# Patient Record
Sex: Male | Born: 1965 | Race: White | Hispanic: No | Marital: Single | Smoking: Former smoker
Health system: Southern US, Community
[De-identification: ages and names within clinical notes are randomized; demographics above are authoritative.]

## PROBLEM LIST (undated history)

## (undated) DIAGNOSIS — F200 Paranoid schizophrenia: Secondary | ICD-10-CM

## (undated) DIAGNOSIS — F329 Major depressive disorder, single episode, unspecified: Secondary | ICD-10-CM

## (undated) DIAGNOSIS — I469 Cardiac arrest, cause unspecified: Secondary | ICD-10-CM

## (undated) DIAGNOSIS — I10 Essential (primary) hypertension: Secondary | ICD-10-CM

## (undated) DIAGNOSIS — F32A Depression, unspecified: Secondary | ICD-10-CM

## (undated) DIAGNOSIS — F411 Generalized anxiety disorder: Secondary | ICD-10-CM

## (undated) DIAGNOSIS — N179 Acute kidney failure, unspecified: Secondary | ICD-10-CM

---

## 2008-07-24 ENCOUNTER — Emergency Department (HOSPITAL_COMMUNITY): Admission: EM | Admit: 2008-07-24 | Discharge: 2008-07-24 | Payer: Self-pay | Admitting: Emergency Medicine

## 2015-08-10 ENCOUNTER — Encounter (HOSPITAL_COMMUNITY): Payer: Self-pay

## 2015-08-10 ENCOUNTER — Emergency Department (HOSPITAL_COMMUNITY): Payer: Self-pay

## 2015-08-10 ENCOUNTER — Encounter (HOSPITAL_COMMUNITY): Payer: Self-pay | Admitting: Emergency Medicine

## 2015-08-10 ENCOUNTER — Emergency Department (HOSPITAL_COMMUNITY)
Admission: EM | Admit: 2015-08-10 | Discharge: 2015-08-10 | Disposition: A | Discharge status: Home / Self Care | Payer: Self-pay | Attending: Emergency Medicine | Admitting: Emergency Medicine

## 2015-08-10 DIAGNOSIS — R569 Unspecified convulsions: Secondary | ICD-10-CM | POA: Insufficient documentation

## 2015-08-10 DIAGNOSIS — R251 Tremor, unspecified: Secondary | ICD-10-CM | POA: Insufficient documentation

## 2015-08-10 DIAGNOSIS — I10 Essential (primary) hypertension: Secondary | ICD-10-CM | POA: Insufficient documentation

## 2015-08-10 DIAGNOSIS — Z79899 Other long term (current) drug therapy: Secondary | ICD-10-CM | POA: Insufficient documentation

## 2015-08-10 DIAGNOSIS — R42 Dizziness and giddiness: Secondary | ICD-10-CM | POA: Insufficient documentation

## 2015-08-10 HISTORY — DX: Generalized anxiety disorder: F41.1

## 2015-08-10 HISTORY — DX: Depression, unspecified: F32.A

## 2015-08-10 HISTORY — DX: Major depressive disorder, single episode, unspecified: F32.9

## 2015-08-10 HISTORY — DX: Essential (primary) hypertension: I10

## 2015-08-10 LAB — URINALYSIS, ROUTINE W REFLEX MICROSCOPIC
Bilirubin Urine: NEGATIVE
Glucose, UA: NEGATIVE mg/dL
Hgb urine dipstick: NEGATIVE
Ketones, ur: 15 mg/dL — AB
LEUKOCYTES UA: NEGATIVE
NITRITE: NEGATIVE
PH: 6.5 (ref 5.0–8.0)
Protein, ur: 30 mg/dL — AB
SPECIFIC GRAVITY, URINE: 1.014 (ref 1.005–1.030)

## 2015-08-10 LAB — CBC WITH DIFFERENTIAL/PLATELET
BASOS PCT: 1 %
Basophils Absolute: 0.1 10*3/uL (ref 0.0–0.1)
EOS ABS: 0.1 10*3/uL (ref 0.0–0.7)
Eosinophils Relative: 1 %
HEMATOCRIT: 41 % (ref 39.0–52.0)
Hemoglobin: 14.4 g/dL (ref 13.0–17.0)
LYMPHS ABS: 1.3 10*3/uL (ref 0.7–4.0)
Lymphocytes Relative: 14 %
MCH: 33.6 pg (ref 26.0–34.0)
MCHC: 35.1 g/dL (ref 30.0–36.0)
MCV: 95.8 fL (ref 78.0–100.0)
MONO ABS: 0.4 10*3/uL (ref 0.1–1.0)
MONOS PCT: 4 %
NEUTROS ABS: 7.1 10*3/uL (ref 1.7–7.7)
Neutrophils Relative %: 80 %
Platelets: 160 10*3/uL (ref 150–400)
RBC: 4.28 MIL/uL (ref 4.22–5.81)
RDW: 13.4 % (ref 11.5–15.5)
WBC: 8.9 10*3/uL (ref 4.0–10.5)

## 2015-08-10 LAB — URINE MICROSCOPIC-ADD ON: RBC / HPF: NONE SEEN RBC/hpf (ref 0–5)

## 2015-08-10 LAB — BASIC METABOLIC PANEL
Anion gap: 10 (ref 5–15)
BUN: 10 mg/dL (ref 6–20)
CHLORIDE: 105 mmol/L (ref 101–111)
CO2: 22 mmol/L (ref 22–32)
Calcium: 9.6 mg/dL (ref 8.9–10.3)
Creatinine, Ser: 0.84 mg/dL (ref 0.61–1.24)
GFR calc non Af Amer: >60 mL/min (ref >60)
Glucose, Bld: 154 mg/dL — ABNORMAL HIGH (ref 65–99)
POTASSIUM: 3.7 mmol/L (ref 3.5–5.1)
Sodium: 137 mmol/L (ref 135–145)

## 2015-08-10 LAB — RAPID URINE DRUG SCREEN, HOSP PERFORMED
AMPHETAMINES: NOT DETECTED
BARBITURATES: NOT DETECTED
BENZODIAZEPINES: NOT DETECTED
COCAINE: NOT DETECTED
OPIATES: NOT DETECTED
TETRAHYDROCANNABINOL: NOT DETECTED

## 2015-08-10 LAB — ETHANOL: Alcohol, Ethyl (B): <5 mg/dL (ref <5)

## 2015-08-10 MED ORDER — LEVETIRACETAM 500 MG PO TABS
1500.0000 mg | ORAL_TABLET | Freq: Once | ORAL | Status: AC
  Administered 2015-08-10: 1500 mg via ORAL
  Filled 2015-08-10: qty 3

## 2015-08-10 MED ORDER — CHLORDIAZEPOXIDE HCL 25 MG PO CAPS: ORAL_CAPSULE | ORAL | Status: DC

## 2015-08-10 MED ORDER — LEVETIRACETAM 500 MG PO TABS: 500.0000 mg | ORAL_TABLET | Freq: Two times a day (BID) | ORAL | Status: DC

## 2015-08-10 MED ORDER — CHLORDIAZEPOXIDE HCL 25 MG PO CAPS
100.0000 mg | ORAL_CAPSULE | Freq: Once | ORAL | Status: AC
  Administered 2015-08-10: 100 mg via ORAL
  Filled 2015-08-10: qty 4

## 2015-08-10 NOTE — ED Notes (Signed)
Per [facility] patient had witness seizure lasting 2 minutes at approximately 0745, witnessed by mother who states she observed "full body convulsions".  Patient alert but confused.

## 2015-08-10 NOTE — ED Notes (Signed)
Pt stable, ambulatory, states understanding of discharge instructions 

## 2015-08-10 NOTE — ED Notes (Signed)
Pt arrived via [facility] c/o seizure at home, witnessed lasting , no injury, family states no previous hx of seizures, was full body shaking. Was discharged from ED earlier today

## 2015-08-10 NOTE — Discharge Instructions (Signed)
Seizure, Adult °A seizure means there is unusual activity in the brain. A seizure can cause changes in attention or behavior. Seizures often cause shaking (convulsions). Seizures often last from 30 seconds to 2 minutes. °HOME CARE  °· If you are given medicines, take them exactly as told by your doctor. °· Keep all doctor visits as told. °· Do not swim or drive until your doctor says it is okay. °· Teach others what to do if you have a seizure. They should: °¨ Lay you on the ground. °¨ Put a cushion under your head. °¨ Loosen any tight clothing around your neck. °¨ Turn you on your side. °¨ Stay with you until you get better. °GET HELP RIGHT AWAY IF:  °· The seizure lasts longer than 2 to 5 minutes. °· The seizure is very bad. °· The person does not wake up after the seizure. °· The person's attention or behavior changes. °Drive the person to the emergency room or call your local emergency services (911 in [place] °MAKE SURE YOU:  °· Understand these instructions. °· Will watch your condition. °· Will get help right away if you are not doing well or get worse. °  °This information is not intended to replace advice given to you by your health care provider. Make sure you discuss any questions you have with your health care provider. °  °Document Released: 07/17/2007 Document Revised: 04/22/2011 Document Reviewed: 09/09/2012 °Elsevier Interactive Patient Education ©2016 [employer] ° °

## 2015-08-10 NOTE — ED Provider Notes (Signed)
CSN: [id number]     Arrival date & time 08/10/15  1605 History   First MD Initiated Contact with Patient 08/10/15 1614     Chief Complaint  Patient presents with  . Seizures     (Consider location/radiation/quality/duration/timing/severity/associated sxs/prior Treatment) Patient is a 50 y.o. male presenting with seizures. The history is provided by the patient.  Seizures Seizure activity on arrival: no   Seizure type:  Grand mal Initial focality:  None Episode characteristics: abnormal movements and generalized shaking   Return to baseline: yes   Severity:  Mild Duration:  2 minutes Timing:  Once Number of seizures this episode:  2 Progression:  Resolved Context: alcohol withdrawal and change in medication (didnt take home klonipin)   Recent head injury:  No recent head injuries PTA treatment:  None History of seizures: no    50 yo M With a chief complaint of seizure. Per family he had full tonic-clonic activity while sitting on a stool. Did mention that he didn't feel well just prior to the event. Full-body shaking lasted about 2 minutes. Was followed by a post ictal. Patient is back to baseline on my exam does not remember what happened. This is the second such event today. The first when he bit his tongue. No loss of bowel or bladder. The patient has tried to cut back on his alcohol intake over the past couple days. He also has tried to cut back on his Klonopin.  Old records reviewed patient evaluated this morning with a unremarkable laboratory workup and negative UDS and a negative alcohol level.  Past Medical History  Diagnosis Date  . Hypertension   . Depression   . Generalized anxiety disorder    History reviewed. No pertinent past surgical history. History reviewed. No pertinent family history. Social History  Substance Use Topics  . Smoking status: Never Smoker   . Smokeless tobacco: None  . Alcohol Use: 50.4 oz/week    84 Cans of beer per week     Comment:  "about a 12 pack of beer everyday"    Review of Systems  Constitutional: Negative for fever and chills.  HENT: Negative for congestion and facial swelling.   Eyes: Negative for discharge and visual disturbance.  Respiratory: Negative for shortness of breath.   Cardiovascular: Negative for chest pain and palpitations.  Gastrointestinal: Negative for vomiting, abdominal pain and diarrhea.  Musculoskeletal: Negative for myalgias and arthralgias.  Skin: Negative for color change and rash.  Neurological: Positive for seizures. Negative for tremors, syncope and headaches.  Psychiatric/Behavioral: Negative for confusion and dysphoric mood.      Allergies  Review of patient's allergies indicates no known allergies.  Home Medications   Prior to Admission medications   Medication Sig Start Date End Date Taking? Authorizing Provider  amLODipine (NORVASC) 10 MG tablet Take 10 mg by mouth daily.    Historical Provider, MD  chlordiazePOXIDE (LIBRIUM) 25 MG capsule 50mg  PO TID x 1D, then 25-50mg  PO BID X 1D, then 25-50mg  PO QD X 1D 08/10/15   Melene Planan Zaxton Angerer, DO  clonazePAM (KLONOPIN) 1 MG tablet Take 0.5 mg by mouth 2 (two) times daily.    Historical Provider, MD  levETIRAcetam (KEPPRA) 500 MG tablet Take 1 tablet (500 mg total) by mouth 2 (two) times daily. 08/10/15   Melene Planan Vetra Shinall, DO  metoprolol (LOPRESSOR) 50 MG tablet Take 50 mg by mouth 2 (two) times daily.    Historical Provider, MD  Multiple Vitamins-Minerals (MULTIVITAL PO) Take 1 tablet by mouth  daily.    Historical Provider, MD  OLANZapine (ZYPREXA) 20 MG tablet Take 20 mg by mouth at bedtime.    Historical Provider, MD  venlafaxine XR (EFFEXOR-XR) 150 MG 24 hr capsule Take 150 mg by mouth daily with breakfast.    Historical Provider, MD   BP 118/95 mmHg  Pulse 73  Temp(Src) 99 F (37.2 C) (Oral)  Resp 16  SpO2 95% Physical Exam  Constitutional: He is oriented to person, place, and time. He appears well-developed and well-nourished.  HENT:   Head: Normocephalic and atraumatic.  Eyes: EOM are normal. Pupils are equal, round, and reactive to light.  Neck: Normal range of motion. Neck supple. No JVD present.  Cardiovascular: Normal rate and regular rhythm.  Exam reveals no gallop and no friction rub.   No murmur heard. Pulmonary/Chest: No respiratory distress. He has no wheezes.  Abdominal: He exhibits no distension. There is no tenderness. There is no rebound and no guarding.  Musculoskeletal: Normal range of motion.  Neurological: He is alert and oriented to person, place, and time. He has normal strength. No cranial nerve deficit or sensory deficit. He displays a negative Romberg sign. Coordination and gait normal. GCS eye subscore is 4. GCS verbal subscore is 5. GCS motor subscore is 6. He displays no Babinski's sign on the right side. He displays no Babinski's sign on the left side.  Reflex Scores:      Tricep reflexes are 2+ on the right side and 2+ on the left side.      Bicep reflexes are 2+ on the right side and 2+ on the left side.      Brachioradialis reflexes are 2+ on the right side and 2+ on the left side.      Patellar reflexes are 2+ on the right side and 2+ on the left side.      Achilles reflexes are 2+ on the right side and 2+ on the left side. Benign neuro exam  Skin: No rash noted. No pallor.  Psychiatric: He has a normal mood and affect. His behavior is normal.  Nursing note and vitals reviewed.   ED Course  Procedures (including critical care time) Labs Review Labs Reviewed  CBG MONITORING, ED    Imaging Review Ct Head Wo Contrast  08/10/2015  CLINICAL DATA:  Seizure.  Struck head during seizure EXAM: CT HEAD WITHOUT CONTRAST TECHNIQUE: Contiguous axial images were obtained from the base of the skull through the vertex without intravenous contrast. COMPARISON:  None. FINDINGS: No acute intracranial hemorrhage. No focal mass lesion. No CT evidence of acute infarction. No midline shift or mass effect. No  hydrocephalus. Basilar cisterns are patent. Paranasal sinuses and mastoid air cells are clear. Orbits are clear. IMPRESSION: Normal head CT. Electronically Signed   By: Genevive Bi M.D.   On: 08/10/2015 17:17   I have personally reviewed and evaluated these images and lab results as part of my medical decision-making.   EKG Interpretation   Date/Time:  Thursday August 10 2015 16:11:41 EDT Ventricular Rate:  82 PR Interval:    QRS Duration: 117 QT Interval:  402 QTC Calculation: 470 R Axis:   20 Text Interpretation:  Sinus rhythm Nonspecific intraventricular conduction  delay no wpw, prolonged qt, brugada No significant change since last  tracing Confirmed by Marquice Uddin MD, Reuel Boom [phone]) on 08/10/2015 4:56:30 PM      MDM   Final diagnoses:  Seizure (HCC)    50 yo M with a chief complaint of a  seizure. Patient had 2 such events today. Will obtain a CT scan of the head. Benign neuro exam. Will discuss with neurology for possible antiepileptic therapy.  Discussed with Dr. Cherylynn RidgesShikhman, will start on 500mg  of keppra bid.  Given oral load. Given script for librium taper.   10:09 PM:  I have discussed the diagnosis/risks/treatment options with the patient and family and believe the pt to be eligible for discharge home to follow-up with PCP. We also discussed returning to the ED immediately if new or worsening sx occur. We discussed the sx which are most concerning (e.g., recurrent seizure) that necessitate immediate return. Medications administered to the patient during their visit and any new prescriptions provided to the patient are listed below.  Medications given during this visit Medications  chlordiazePOXIDE (LIBRIUM) capsule 100 mg (100 mg Oral Given 08/10/15 1621)  levETIRAcetam (KEPPRA) tablet 1,500 mg (1,500 mg Oral Given 08/10/15 1938)    Discharge Medication List as of 08/10/2015  6:40 PM    START taking these medications   Details  chlordiazePOXIDE (LIBRIUM) 25 MG capsule 50mg   PO TID x 1D, then 25-50mg  PO BID X 1D, then 25-50mg  PO QD X 1D, Print    levETIRAcetam (KEPPRA) 500 MG tablet Take 1 tablet (500 mg total) by mouth 2 (two) times daily., Starting 08/10/2015, Until Discontinued, Print        The patient appears reasonably screen and/or stabilized for discharge and I doubt any other medical condition or other [facility] requiring further screening, evaluation, or treatment in the ED at this time prior to discharge.     Melene Planan Tina Gruner, DO 08/10/15 2209

## 2015-08-10 NOTE — ED Notes (Addendum)
Patient ambulated in hall independently with steady gait.  Complains of minor lightheadedness while walking.

## 2015-08-10 NOTE — ED Provider Notes (Signed)
CSN: [id number]     Arrival date & time 08/10/15  [id number] History   First MD Initiated Contact with Patient 08/10/15 [phone]     Chief Complaint  Patient presents with  . Seizures     (Consider location/radiation/quality/duration/timing/severity/associated sxs/prior Treatment) Patient is a 50 y.o. male presenting with seizures.  Seizures Seizure activity on arrival: no   Seizure type:  Grand mal Preceding symptoms comment:  Unable to specify Initial focality:  None Episode characteristics: abnormal movements   Postictal symptoms: confusion   Return to baseline: no   Severity:  Mild Duration:  2 minutes Timing:  Once Progression:  Resolved   Past Medical History  Diagnosis Date  . Hypertension   . Depression   . Generalized anxiety disorder    History reviewed. No pertinent past surgical history. No family history on file. Social History  Substance Use Topics  . Smoking status: Never Smoker   . Smokeless tobacco: None  . Alcohol Use: 50.4 oz/week    84 Cans of beer per week     Comment: "about a 12 pack of beer everyday"    Review of Systems  Neurological: Positive for seizures. Negative for dizziness and headaches.  All other systems reviewed and are negative.     Allergies  Review of patient's allergies indicates no known allergies.  Home Medications   Prior to Admission medications   Medication Sig Start Date End Date Taking? Authorizing Provider  amLODipine (NORVASC) 10 MG tablet Take 10 mg by mouth daily.   Yes Historical Provider, MD  chlordiazePOXIDE (LIBRIUM) 25 MG capsule 50mg  PO TID x 1D, then 25-50mg  PO BID X 1D, then 25-50mg  PO QD X 1D 08/10/15   Melene Planan Floyd, DO  clonazePAM (KLONOPIN) 1 MG tablet Take 0.5 mg by mouth 2 (two) times daily.   Yes Historical Provider, MD  levETIRAcetam (KEPPRA) 500 MG tablet Take 1 tablet (500 mg total) by mouth 2 (two) times daily. 08/10/15   Melene Planan Floyd, DO  metoprolol (LOPRESSOR) 50 MG tablet Take 50 mg by mouth 2 (two) times  daily.   Yes Historical Provider, MD  Multiple Vitamins-Minerals (MULTIVITAL PO) Take 1 tablet by mouth daily.   Yes Historical Provider, MD  OLANZapine (ZYPREXA) 20 MG tablet Take 20 mg by mouth at bedtime.   Yes Historical Provider, MD  venlafaxine XR (EFFEXOR-XR) 150 MG 24 hr capsule Take 150 mg by mouth daily with breakfast.   Yes Historical Provider, MD   BP 146/95 mmHg  Pulse 69  Temp(Src) 98.9 F (37.2 C) (Oral)  Resp 13  SpO2 96% Physical Exam  Constitutional: He is oriented to person, place, and time. He appears well-developed and well-nourished.  HENT:  Head: Normocephalic and atraumatic.  Neck: Normal range of motion.  Cardiovascular: Normal rate.   Pulmonary/Chest: Effort normal. No respiratory distress.  Abdominal: Soft. He exhibits no distension. There is no tenderness.  Musculoskeletal: Normal range of motion.  Neurological: He is alert and oriented to person, place, and time. No cranial nerve deficit. Coordination normal.  No altered mental status, able to give full seemingly accurate history.  Face is symmetric, EOM's intact, pupils equal and reactive, vision intact, tongue and uvula midline without deviation Upper and Lower extremity motor 5/5, intact pain perception in distal extremities, 2+ reflexes in biceps, patella and achilles tendons. Finger to nose normal, heel to shin normal.   Skin: Skin is warm and dry.  Nursing note and vitals reviewed.   ED Course  Procedures (including critical care  time) Labs Review Labs Reviewed  BASIC METABOLIC PANEL - Abnormal; Notable for the following:    Glucose, Bld 154 (*)    All other components within normal limits  URINALYSIS, ROUTINE W REFLEX MICROSCOPIC (NOT AT [facility]) - Abnormal; Notable for the following:    Ketones, ur 15 (*)    Protein, ur 30 (*)    All other components within normal limits  URINE MICROSCOPIC-ADD ON - Abnormal; Notable for the following:    Squamous Epithelial / LPF 0-5 (*)    Bacteria, UA FEW  (*)    All other components within normal limits  CBC WITH DIFFERENTIAL/PLATELET  URINE RAPID DRUG SCREEN, HOSP PERFORMED  ETHANOL    Imaging Review Ct Head Wo Contrast  08/10/2015  CLINICAL DATA:  Seizure.  Struck head during seizure EXAM: CT HEAD WITHOUT CONTRAST TECHNIQUE: Contiguous axial images were obtained from the base of the skull through the vertex without intravenous contrast. COMPARISON:  None. FINDINGS: No acute intracranial hemorrhage. No focal mass lesion. No CT evidence of acute infarction. No midline shift or mass effect. No hydrocephalus. Basilar cisterns are patent. Paranasal sinuses and mastoid air cells are clear. Orbits are clear. IMPRESSION: Normal head CT. Electronically Signed   By: Genevive BiStewart  Edmunds M.D.   On: 08/10/2015 17:17   I have personally reviewed and evaluated these images and lab results as part of my medical decision-making.   EKG Interpretation   Date/Time:  Thursday August 10 2015 08:51:51 EDT Ventricular Rate:  65 PR Interval:    QRS Duration: 115 QT Interval:  437 QTC Calculation: 455 R Axis:   41 Text Interpretation:  Sinus rhythm Nonspecific intraventricular conduction  delay Confirmed by Ayomide Zuleta MD, Barbara CowerJASON [phone]([id number]) on 08/10/2015 10:55:49 AM      MDM   Final diagnoses:  Seizure-like activity (HCC)   50 yo M here with seizure like activity. No obvious cause. No h/o same. Does have significant daily alcohol intake, but has not had withdrawal symptoms previously. Plan for basic workup. No trauma or neuro changes to indicate need for a head CT.    Workup ok. After observation, still close to baseline, doubt subclinical seizure activity currently. Plan for discharge with pcp follow up as needed. If repeat seizure will return here for repeat evaluation.   New Prescriptions: Discharge Medication List as of 08/10/2015 12:22 PM       I have personally and contemperaneously reviewed labs and imaging and used in my decision making as above.   A  medical screening exam was performed and I feel the patient has had an appropriate workup for their chief complaint at this time and likelihood of emergent condition existing is low and thus workup can continue on an outpatient basis.. Their vital signs are stable. They have been counseled on decision, discharge, follow up and which symptoms necessitate immediate return to the emergency department.  They verbally stated understanding and agreement with plan and discharged in stable condition.     Marily MemosJason Cincere Deprey, MD 08/11/15 [phone]

## 2018-04-01 ENCOUNTER — Other Ambulatory Visit: Payer: Self-pay

## 2018-04-01 ENCOUNTER — Inpatient Hospital Stay (HOSPITAL_COMMUNITY): Payer: Non-veteran care

## 2018-04-01 ENCOUNTER — Encounter (HOSPITAL_COMMUNITY): Payer: Self-pay | Admitting: *Deleted

## 2018-04-01 ENCOUNTER — Emergency Department (HOSPITAL_COMMUNITY): Payer: Non-veteran care

## 2018-04-01 ENCOUNTER — Inpatient Hospital Stay (HOSPITAL_COMMUNITY)
Admission: EM | Admit: 2018-04-01 | Discharge: 2018-04-08 | DRG: 917 | Disposition: A | Discharge status: Other Acute Inpatient Hospital | Payer: Non-veteran care | Attending: Internal Medicine | Admitting: Internal Medicine

## 2018-04-01 DIAGNOSIS — N170 Acute kidney failure with tubular necrosis: Secondary | ICD-10-CM | POA: Diagnosis not present

## 2018-04-01 DIAGNOSIS — R402212 Coma scale, best verbal response, none, at arrival to emergency department: Secondary | ICD-10-CM | POA: Diagnosis present

## 2018-04-01 DIAGNOSIS — F200 Paranoid schizophrenia: Secondary | ICD-10-CM | POA: Diagnosis not present

## 2018-04-01 DIAGNOSIS — Z915 Personal history of self-harm: Secondary | ICD-10-CM

## 2018-04-01 DIAGNOSIS — F332 Major depressive disorder, recurrent severe without psychotic features: Secondary | ICD-10-CM | POA: Diagnosis present

## 2018-04-01 DIAGNOSIS — R4182 Altered mental status, unspecified: Secondary | ICD-10-CM

## 2018-04-01 DIAGNOSIS — E876 Hypokalemia: Secondary | ICD-10-CM | POA: Diagnosis present

## 2018-04-01 DIAGNOSIS — F1011 Alcohol abuse, in remission: Secondary | ICD-10-CM | POA: Diagnosis present

## 2018-04-01 DIAGNOSIS — F17210 Nicotine dependence, cigarettes, uncomplicated: Secondary | ICD-10-CM | POA: Diagnosis present

## 2018-04-01 DIAGNOSIS — E440 Moderate protein-calorie malnutrition: Secondary | ICD-10-CM

## 2018-04-01 DIAGNOSIS — R402112 Coma scale, eyes open, never, at arrival to emergency department: Secondary | ICD-10-CM | POA: Diagnosis present

## 2018-04-01 DIAGNOSIS — J9601 Acute respiratory failure with hypoxia: Secondary | ICD-10-CM

## 2018-04-01 DIAGNOSIS — R579 Shock, unspecified: Secondary | ICD-10-CM

## 2018-04-01 DIAGNOSIS — I429 Cardiomyopathy, unspecified: Secondary | ICD-10-CM | POA: Diagnosis present

## 2018-04-01 DIAGNOSIS — R402312 Coma scale, best motor response, none, at arrival to emergency department: Secondary | ICD-10-CM | POA: Diagnosis present

## 2018-04-01 DIAGNOSIS — Z4659 Encounter for fitting and adjustment of other gastrointestinal appliance and device: Secondary | ICD-10-CM

## 2018-04-01 DIAGNOSIS — I5021 Acute systolic (congestive) heart failure: Secondary | ICD-10-CM | POA: Diagnosis present

## 2018-04-01 DIAGNOSIS — I340 Nonrheumatic mitral (valve) insufficiency: Secondary | ICD-10-CM

## 2018-04-01 DIAGNOSIS — E875 Hyperkalemia: Secondary | ICD-10-CM | POA: Diagnosis not present

## 2018-04-01 DIAGNOSIS — J690 Pneumonitis due to inhalation of food and vomit: Secondary | ICD-10-CM | POA: Diagnosis not present

## 2018-04-01 DIAGNOSIS — G9341 Metabolic encephalopathy: Secondary | ICD-10-CM | POA: Diagnosis present

## 2018-04-01 DIAGNOSIS — T447X2A Poisoning by beta-adrenoreceptor antagonists, intentional self-harm, initial encounter: Secondary | ICD-10-CM

## 2018-04-01 DIAGNOSIS — F4010 Social phobia, unspecified: Secondary | ICD-10-CM | POA: Diagnosis present

## 2018-04-01 DIAGNOSIS — I110 Hypertensive heart disease with heart failure: Secondary | ICD-10-CM | POA: Diagnosis present

## 2018-04-01 DIAGNOSIS — K7200 Acute and subacute hepatic failure without coma: Secondary | ICD-10-CM | POA: Diagnosis present

## 2018-04-01 DIAGNOSIS — N179 Acute kidney failure, unspecified: Secondary | ICD-10-CM

## 2018-04-01 DIAGNOSIS — T424X2A Poisoning by benzodiazepines, intentional self-harm, initial encounter: Principal | ICD-10-CM | POA: Diagnosis present

## 2018-04-01 DIAGNOSIS — F411 Generalized anxiety disorder: Secondary | ICD-10-CM | POA: Diagnosis present

## 2018-04-01 DIAGNOSIS — I371 Nonrheumatic pulmonary valve insufficiency: Secondary | ICD-10-CM

## 2018-04-01 DIAGNOSIS — Z66 Do not resuscitate: Secondary | ICD-10-CM | POA: Diagnosis present

## 2018-04-01 DIAGNOSIS — T50901A Poisoning by unspecified drugs, medicaments and biological substances, accidental (unintentional), initial encounter: Secondary | ICD-10-CM | POA: Diagnosis present

## 2018-04-01 DIAGNOSIS — I469 Cardiac arrest, cause unspecified: Secondary | ICD-10-CM | POA: Diagnosis present

## 2018-04-01 DIAGNOSIS — Z6820 Body mass index (BMI) 20.0-20.9, adult: Secondary | ICD-10-CM

## 2018-04-01 DIAGNOSIS — E872 Acidosis: Secondary | ICD-10-CM | POA: Diagnosis present

## 2018-04-01 DIAGNOSIS — F6381 Intermittent explosive disorder: Secondary | ICD-10-CM | POA: Diagnosis present

## 2018-04-01 DIAGNOSIS — R0689 Other abnormalities of breathing: Secondary | ICD-10-CM

## 2018-04-01 DIAGNOSIS — R34 Anuria and oliguria: Secondary | ICD-10-CM | POA: Diagnosis not present

## 2018-04-01 DIAGNOSIS — E162 Hypoglycemia, unspecified: Secondary | ICD-10-CM | POA: Diagnosis present

## 2018-04-01 DIAGNOSIS — G40909 Epilepsy, unspecified, not intractable, without status epilepticus: Secondary | ICD-10-CM | POA: Diagnosis present

## 2018-04-01 LAB — POCT I-STAT 7, (LYTES, BLD GAS, ICA,H+H)
Acid-Base Excess: 3 mmol/L — ABNORMAL HIGH (ref 0.0–2.0)
Bicarbonate: 26.6 mmol/L (ref 20.0–28.0)
Calcium, Ion: 1.18 mmol/L (ref 1.15–1.40)
HCT: 30 % — ABNORMAL LOW (ref 39.0–52.0)
Hemoglobin: 10.2 g/dL — ABNORMAL LOW (ref 13.0–17.0)
O2 Saturation: 100 %
Potassium: 3.4 mmol/L — ABNORMAL LOW (ref 3.5–5.1)
Sodium: 144 mmol/L (ref 135–145)
TCO2: 28 mmol/L (ref 22–32)
pCO2 arterial: 37.8 mmHg (ref 32.0–48.0)
pH, Arterial: 7.456 — ABNORMAL HIGH (ref 7.350–7.450)
pO2, Arterial: 390 mmHg — ABNORMAL HIGH (ref 83.0–108.0)

## 2018-04-01 LAB — ACETAMINOPHEN LEVEL: Acetaminophen (Tylenol), Serum: <10 ug/mL — ABNORMAL LOW (ref 10–30)

## 2018-04-01 LAB — HEPATIC FUNCTION PANEL
ALT: 12 U/L (ref 0–44)
AST: 15 U/L (ref 15–41)
Albumin: 3.1 g/dL — ABNORMAL LOW (ref 3.5–5.0)
Alkaline Phosphatase: 49 U/L (ref 38–126)
BILIRUBIN DIRECT: 0.2 mg/dL (ref 0.0–0.2)
Indirect Bilirubin: 0.5 mg/dL (ref 0.3–0.9)
Total Bilirubin: 0.7 mg/dL (ref 0.3–1.2)
Total Protein: 5.6 g/dL — ABNORMAL LOW (ref 6.5–8.1)

## 2018-04-01 LAB — RAPID URINE DRUG SCREEN, HOSP PERFORMED
Amphetamines: NOT DETECTED
Barbiturates: NOT DETECTED
Benzodiazepines: POSITIVE — AB
Cocaine: NOT DETECTED
Opiates: NOT DETECTED
Tetrahydrocannabinol: NOT DETECTED

## 2018-04-01 LAB — I-STAT TROPONIN, ED: Troponin i, poc: 0 ng/mL (ref 0.00–0.08)

## 2018-04-01 LAB — TROPONIN I
Troponin I: <0.03 ng/mL (ref <0.03)
Troponin I: <0.03 ng/mL (ref <0.03)

## 2018-04-01 LAB — CBC
HEMATOCRIT: 31.3 % — AB (ref 39.0–52.0)
Hemoglobin: 10.2 g/dL — ABNORMAL LOW (ref 13.0–17.0)
MCH: 32.6 pg (ref 26.0–34.0)
MCHC: 32.6 g/dL (ref 30.0–36.0)
MCV: 100 fL (ref 80.0–100.0)
Platelets: 131 10*3/uL — ABNORMAL LOW (ref 150–400)
RBC: 3.13 MIL/uL — ABNORMAL LOW (ref 4.22–5.81)
RDW: 13.3 % (ref 11.5–15.5)
WBC: 7.6 10*3/uL (ref 4.0–10.5)
nRBC: 0 % (ref 0.0–0.2)

## 2018-04-01 LAB — BASIC METABOLIC PANEL
Anion gap: 7 (ref 5–15)
BUN: 24 mg/dL — ABNORMAL HIGH (ref 6–20)
CO2: 25 mmol/L (ref 22–32)
Calcium: 8.3 mg/dL — ABNORMAL LOW (ref 8.9–10.3)
Chloride: 112 mmol/L — ABNORMAL HIGH (ref 98–111)
Creatinine, Ser: 2.12 mg/dL — ABNORMAL HIGH (ref 0.61–1.24)
GFR calc Af Amer: 41 mL/min — ABNORMAL LOW (ref >60)
GFR calc non Af Amer: 35 mL/min — ABNORMAL LOW (ref >60)
GLUCOSE: 83 mg/dL (ref 70–99)
Potassium: 3.3 mmol/L — ABNORMAL LOW (ref 3.5–5.1)
Sodium: 144 mmol/L (ref 135–145)

## 2018-04-01 LAB — SALICYLATE LEVEL: Salicylate Lvl: <7 mg/dL (ref 2.8–30.0)

## 2018-04-01 LAB — LACTATE DEHYDROGENASE: LDH: 218 U/L — ABNORMAL HIGH (ref 98–192)

## 2018-04-01 LAB — ETHANOL: Alcohol, Ethyl (B): <10 mg/dL (ref <10)

## 2018-04-01 LAB — CBG MONITORING, ED: Glucose-Capillary: 78 mg/dL (ref 70–99)

## 2018-04-01 LAB — ECHOCARDIOGRAM COMPLETE
Height: 74 in
Weight: 2539.7 oz

## 2018-04-01 LAB — GLUCOSE, CAPILLARY
Glucose-Capillary: 129 mg/dL — ABNORMAL HIGH (ref 70–99)
Glucose-Capillary: 70 mg/dL (ref 70–99)

## 2018-04-01 LAB — MRSA PCR SCREENING: MRSA by PCR: NEGATIVE

## 2018-04-01 LAB — MAGNESIUM: Magnesium: 2.1 mg/dL (ref 1.7–2.4)

## 2018-04-01 MED ORDER — SODIUM CHLORIDE 0.9% FLUSH
3.0000 mL | Freq: Once | INTRAVENOUS | Status: AC
  Administered 2018-04-01: 3 mL via INTRAVENOUS

## 2018-04-01 MED ORDER — SODIUM CHLORIDE 0.9 % IV SOLN
INTRAVENOUS | Status: AC | PRN
  Administered 2018-04-01: 1000 mL via INTRAVENOUS

## 2018-04-01 MED ORDER — SODIUM CHLORIDE 0.9 % IV SOLN: 250.0000 mL | INTRAVENOUS | Status: DC

## 2018-04-01 MED ORDER — FENTANYL CITRATE (PF) 100 MCG/2ML IJ SOLN: 50.0000 ug | Freq: Once | INTRAMUSCULAR | Status: DC

## 2018-04-01 MED ORDER — NOREPINEPHRINE 4 MG/250ML-% IV SOLN
2.0000 ug/min | INTRAVENOUS | Status: DC
  Administered 2018-04-01: 2 ug/min via INTRAVENOUS
  Filled 2018-04-01: qty 250

## 2018-04-01 MED ORDER — PROPOFOL 1000 MG/100ML IV EMUL: 0.0000 ug/kg/min | INTRAVENOUS | Status: DC

## 2018-04-01 MED ORDER — ROCURONIUM BROMIDE 50 MG/5ML IV SOLN
INTRAVENOUS | Status: AC | PRN
  Administered 2018-04-01: 70 mg via INTRAVENOUS

## 2018-04-01 MED ORDER — FENTANYL BOLUS VIA INFUSION
50.0000 ug | INTRAVENOUS | Status: DC | PRN
  Filled 2018-04-01: qty 50

## 2018-04-01 MED ORDER — POTASSIUM CHLORIDE 20 MEQ/15ML (10%) PO SOLN
40.0000 meq | Freq: Once | ORAL | Status: AC
  Administered 2018-04-01: 40 meq
  Filled 2018-04-01: qty 30

## 2018-04-01 MED ORDER — MIDAZOLAM HCL 2 MG/2ML IJ SOLN
2.0000 mg | INTRAMUSCULAR | Status: DC | PRN
  Administered 2018-04-02 – 2018-04-03 (×2): 2 mg via INTRAVENOUS
  Filled 2018-04-01 (×3): qty 2

## 2018-04-01 MED ORDER — HEPARIN SODIUM (PORCINE) 5000 UNIT/ML IJ SOLN
5000.0000 [IU] | Freq: Three times a day (TID) | INTRAMUSCULAR | Status: DC
  Administered 2018-04-01 – 2018-04-07 (×18): 5000 [IU] via SUBCUTANEOUS
  Filled 2018-04-01 (×21): qty 1

## 2018-04-01 MED ORDER — GLUCAGON HCL RDNA (DIAGNOSTIC) 1 MG IJ SOLR
10.0000 mg | Freq: Once | INTRAVENOUS | Status: AC
  Administered 2018-04-01: 10 mg via INTRAVENOUS
  Filled 2018-04-01: qty 10

## 2018-04-01 MED ORDER — FENTANYL 2500MCG IN NS 250ML (10MCG/ML) PREMIX INFUSION: 25.0000 ug/h | INTRAVENOUS | Status: DC

## 2018-04-01 MED ORDER — PANTOPRAZOLE SODIUM 40 MG IV SOLR: 40.0000 mg | Freq: Every day | INTRAVENOUS | Status: DC

## 2018-04-01 MED ORDER — NOREPINEPHRINE 4 MG/250ML-% IV SOLN
2.0000 ug/min | INTRAVENOUS | Status: DC
  Administered 2018-04-02: 10 ug/min via INTRAVENOUS
  Filled 2018-04-01: qty 250

## 2018-04-01 MED ORDER — MAGNESIUM SULFATE 2 GM/50ML IV SOLN
2.0000 g | Freq: Once | INTRAVENOUS | Status: AC
  Administered 2018-04-01: 2 g via INTRAVENOUS
  Filled 2018-04-01: qty 50

## 2018-04-01 MED ORDER — ORAL CARE MOUTH RINSE
15.0000 mL | OROMUCOSAL | Status: DC
  Administered 2018-04-01 – 2018-04-03 (×15): 15 mL via OROMUCOSAL

## 2018-04-01 MED ORDER — SODIUM CHLORIDE 0.9 % IV SOLN
2.0000 g | INTRAVENOUS | Status: DC
  Administered 2018-04-01 – 2018-04-05 (×5): 2 g via INTRAVENOUS
  Filled 2018-04-01 (×5): qty 20

## 2018-04-01 MED ORDER — LEVETIRACETAM IN NACL 1000 MG/100ML IV SOLN
1000.0000 mg | Freq: Once | INTRAVENOUS | Status: AC
  Administered 2018-04-01: 1000 mg via INTRAVENOUS
  Filled 2018-04-01: qty 100

## 2018-04-01 MED ORDER — SODIUM CHLORIDE 0.9 % IV BOLUS: 1000.0000 mL | Freq: Once | INTRAVENOUS | Status: AC

## 2018-04-01 MED ORDER — CHLORHEXIDINE GLUCONATE 0.12% ORAL RINSE (MEDLINE KIT)
15.0000 mL | Freq: Two times a day (BID) | OROMUCOSAL | Status: DC
  Administered 2018-04-01 – 2018-04-03 (×4): 15 mL via OROMUCOSAL

## 2018-04-01 MED ORDER — SODIUM CHLORIDE 0.9 % IV SOLN
INTRAVENOUS | Status: DC
  Administered 2018-04-01 (×2): via INTRAVENOUS

## 2018-04-01 MED ORDER — MIDAZOLAM HCL 2 MG/2ML IJ SOLN: 2.0000 mg | INTRAMUSCULAR | Status: DC | PRN

## 2018-04-01 MED ORDER — SODIUM BICARBONATE 8.4 % IV SOLN
50.0000 meq | Freq: Once | INTRAVENOUS | Status: AC
  Administered 2018-04-01: 50 meq via INTRAVENOUS
  Filled 2018-04-01: qty 50

## 2018-04-01 MED ORDER — EPINEPHRINE PF 1 MG/ML IJ SOLN
0.5000 ug/min | INTRAVENOUS | Status: DC
  Filled 2018-04-01: qty 4

## 2018-04-01 MED ORDER — ETOMIDATE 2 MG/ML IV SOLN
INTRAVENOUS | Status: AC | PRN
  Administered 2018-04-01: 20 mg via INTRAVENOUS

## 2018-04-01 MED ORDER — LEVETIRACETAM IN NACL 500 MG/100ML IV SOLN
500.0000 mg | Freq: Two times a day (BID) | INTRAVENOUS | Status: DC
  Administered 2018-04-01 – 2018-04-05 (×8): 500 mg via INTRAVENOUS
  Filled 2018-04-01 (×9): qty 100

## 2018-04-01 NOTE — Progress Notes (Addendum)
eLink Physician-Brief Progress Note Patient Name: David Dennis DOB: [birth year] MRN: [id number]   Date of Service  04/01/2018  HPI/Events of Note  Poison control advising to consider epinephrine, instead of current levo. EF 25%.  Labs, meds reviewed. Camera assessment done. Discussed with bed side RN. MAP 69. HR 68. Sinus. No PVC's. On Vent. sats fine. No active chf signs as per RN.   eICU Interventions  - DC levophed - low dose epi trial.  If dose increasing, need a central line. - UOP less. Cr /K/mag was low.  Get stat BMP/Mag level. Replace if low. - High intensity Insulin/glucogon or calcium chloride drip- not at this time. Watch for now.      Intervention Category Intermediate Interventions: Arrhythmia - evaluation and management;Communication with other healthcare providers and/or family;Hypotension - evaluation and management   22:26. Pharmacy assessed peripheral line.  Not a candidate for Epi via peripherally. Since HR, MAP ok, will stick to Levophed, titrate up to 5.   [name] 04/01/2018, 10:00 PM

## 2018-04-01 NOTE — ED Notes (Signed)
Poison Control contacted by this RN, they were informed of the three empty medication bottles found with the patient when he was discovered unresponsive at his home by his family. They are as follows: Metoprolol 50mg  tab x60 tabs, Clonazepam 0.5mg  x30 tabs, & Olanzapine 20mg  tab x30 tabs. The Poison Control Representative recommended to watch for bradycardia & hypotension d/t metoprolol being the most problematic. Also, it was recommended to draw a lactate, CMP, administer a bicarb bolus & insert a foley to monitor for decreased UO. MD Tegeler notified.

## 2018-04-01 NOTE — H&P (Signed)
NAME:  David Dennis, MRN:  [id number], DOB:  11-01-1965, LOS: 0 ADMISSION DATE:  04/01/2018, CONSULTATION DATE:  04/01/18 REFERRING MD:  Tegerler  CHIEF COMPLAINT:  Cardiac Arrest with probable overdose   Brief History   David Dennis is a 53 y.o. male with hx depression and anxiety who was admitted 2/19 after being found down, unresponsive, and pulseless.  He received 3 minutes of CPR prior to ROSC then intubated in ED.   Possible overdose of metoprolol, clonazepam, olanzapine; however, not confirmed as mother states that pt stopped taking his pills "cold [place]" in Nov 2019).  History of present illness   Pt is encephelopathic; therefore, this HPI is obtained from chart review. David Dennis is a 53 y.o. male who has a PMH of HTN, depression, anxiety.  He presented to [facility] ED 2/19 after being found down, unresponsive, and pulseless.  He had apparently been sleepy over the past few days and when family went to wake him up morning of 2/19, he was found unresponsive.  EMS was called and upon their arrival, pt was found pulseless with PEA rhythm.  He received 3 minutes of CPR before ROSC.  He never received any epinephrine or other ACLS medications or defibrillations.  After ROSC, a king airway was attempted to be placed but pt began to gag with questionable purposeful movement.  He was therefore placed on NRB and transported to ED.  Upon arrival to ED, pt did not have a good gag and there was concern for airway protection abilities giving the fact that he was gurgling; therefore, he was intubated.  He was found to have QTc of 478, K 3.4.  APAP and salicylate levels negative.  Additional labs and UDS still pending.  Per EMS, 3 empty bottles were found in pt's trashcan in his room (Metoprolol Tartrate 50mg  filled 08/21/17, Clonazepam 0.5mg  filled 04/28/16, Olanzapine 20mg  filled 12/03/17).  There was some concern for overdose; however, pt's mother states that pt quit taking all of his  medications cold [place] in Nov 2019 and as far as she is aware, was not taking any medications or drinking any alcohol now. She does state that he has a long hx of depression / anxiety and on multiple occasions, has said that he would rather be dead than alive.  She states he has never hinted at or attempted suicide in the past and she does not feel that he would have done so now. Of note, mother also states right before she called EMS, she noted that was breathing normally without any distress (although she could not wake him).  Past Medical History  HTN, depression, anxiety.  Significant Hospital Events   2/19 > admit.  Consults:  PCCM.  Procedures:  ETT 2/19 >   Significant Diagnostic Tests:  CT head 2/19 > negative. Echo 2/19 >   Micro Data:  Blood 2/19 >  Sputum 2/19 >  Urine 2/19 >   Antimicrobials:  None.   Interim history/subjective:  Unresponsive despite no sedation.  Objective:  Blood pressure (!) 135/104, pulse (!) 58, temperature (!) 96.8 F (36 C), temperature source Temporal, resp. rate 16, height 6\' 2"  (1.88 m), weight 72 kg, SpO2 100 %.    Vent Mode: PRVC FiO2 (%):  [100 %] 100 % Set Rate:  [16 bmp] 16 bmp Vt Set:  [650 mL] 650 mL PEEP:  [5 cmH20] 5 cmH20 Plateau Pressure:  [15 cmH20] 15 cmH20  No intake or output data  in the 24 hours ending 04/01/18 1042 Filed Weights   04/01/18 0923  Weight: 72 kg    Examination: General: Young adult male, in NAD. Neuro: Unresponsive despite no sedation.  Pupils unreactive. HEENT: [place]/AT. Sclerae anicteric.  ETT in place. Cardiovascular: RRR, no M/R/G.  Lungs: Respirations even and unlabored.  CTA bilaterally, No W/R/R.  Abdomen: BS x 4, soft, NT/ND.  Musculoskeletal: No gross deformities, no edema.  Skin: Intact, warm, no rashes.  Assessment & Plan:   Cardiac Arrest - despite mention of PEA, unsure if true arrest as pt was breathing appropriately just prior and only received CPR x 3 minutes without any  epinephrine or other ACLS medications or defibrillations.  If true arrest, unclear etiology at this point.  Labs still pending.  QTc slightly prolonged at 478.  Questionable / possible overdose on metoprolol, olanzapine, clonazepam. - Admit to ICU. - Defer TTM given questionable true arrest and goals of care discussions with mother (if event was a true arrest, pt wouldn't have wanted to be resuscitated in the first place). - Avoid fevers. - F/u on labs, UDS. - Empiric 2g Mag now while labs are pending. - Assess QTc q4hrs. - Avoid QT prolonging drugs. - Assess echo.  Respiratory insufficiency - due to inability to protect the airway in the setting of above. - Full vent support. - Assess ABG. - Wean as able. - Daily SBT if mental status allows. - Bronchial hygiene. - Follow CXR.  Hypokalemia. - 40 mEq K per tube. - Empiric 2g Mag as above.    Best Practice:  Diet: NPO. Pain/Anxiety/Delirium protocol (if indicated): Fentanyl PRN / Midazolam PRN. RASS goal 0 to -1. VAP protocol (if indicated): In place. DVT prophylaxis: SCD's / Heparin. GI prophylaxis: None due to prolonged QTc. Glucose control: None. Mobility: Bedrset. Code Status: DNR, confirmed with pt's mother. Family Communication: Mother updated at bedside in ED. Disposition: ICU.  Labs   CBC: Recent Labs  Lab 04/01/18 0929 04/01/18 1026  WBC 7.6  --   HGB 10.2* 10.2*  HCT 31.3* 30.0*  MCV 100.0  --   PLT 131*  --    Basic Metabolic Panel: Recent Labs  Lab 04/01/18 1026  NA 144  K 3.4*   GFR: CrCl cannot be calculated (Patient's most recent lab result is older than the maximum 21 days allowed.). Recent Labs  Lab 04/01/18 0929  WBC 7.6   Liver Function Tests: No results for input(s): AST, ALT, ALKPHOS, BILITOT, PROT, ALBUMIN in the last 168 hours. No results for input(s): LIPASE, AMYLASE in the last 168 hours. No results for input(s): AMMONIA in the last 168 hours. ABG    Component Value Date/Time     PHART 7.456 (H) 04/01/2018 1026   PCO2ART 37.8 04/01/2018 1026   PO2ART 390.0 (H) 04/01/2018 1026   HCO3 26.6 04/01/2018 1026   TCO2 28 04/01/2018 1026   O2SAT 100.0 04/01/2018 1026    Coagulation Profile: No results for input(s): INR, PROTIME in the last 168 hours. Cardiac Enzymes: No results for input(s): CKTOTAL, CKMB, CKMBINDEX, TROPONINI in the last 168 hours. HbA1C: No results found for: HGBA1C CBG: No results for input(s): GLUCAP in the last 168 hours.  Review of Systems:   Unable to obtain as pt is encephalopathic.  Past medical history  He,  has a past medical history of Depression, Generalized anxiety disorder, and Hypertension.   Surgical History   No past surgical history on file.   Social History   reports that  he has never smoked. He does not have any smokeless tobacco history on file. He reports current alcohol use of about 84.0 standard drinks of alcohol per week. He reports that he does not use drugs.   Family history   His family history is not on file.   Allergies No Known Allergies   Home meds  Prior to Admission medications   Medication Sig Start Date End Date Taking? Authorizing Provider  amLODipine (NORVASC) 10 MG tablet Take 10 mg by mouth daily.    [provider]  chlordiazePOXIDE (LIBRIUM) 25 MG capsule 50mg  PO TID x 1D, then 25-50mg  PO BID X 1D, then 25-50mg  PO QD X 1D 08/10/15   Melene Plan, DO  clonazePAM (KLONOPIN) 1 MG tablet Take 0.5 mg by mouth 2 (two) times daily.    [provider]  levETIRAcetam (KEPPRA) 500 MG tablet Take 1 tablet (500 mg total) by mouth 2 (two) times daily. 08/10/15   Melene Plan, DO  metoprolol (LOPRESSOR) 50 MG tablet Take 50 mg by mouth 2 (two) times daily.    [provider]  Multiple Vitamins-Minerals (MULTIVITAL PO) Take 1 tablet by mouth daily.    [provider]  OLANZapine (ZYPREXA) 20 MG tablet Take 20 mg by mouth at bedtime.    [provider]  venlafaxine XR  (EFFEXOR-XR) 150 MG 24 hr capsule Take 150 mg by mouth daily with breakfast.    [provider]    Critical care time: 40 min.    Rutherford Guys, PA [facility] Pager: [phone].  If no answer, [phone] 04/01/2018, 10:42 AM

## 2018-04-01 NOTE — ED Notes (Signed)
Report called to Chanetta Marshall RN on [facility]

## 2018-04-01 NOTE — ED Notes (Signed)
Pt just returned from CT.

## 2018-04-01 NOTE — ED Notes (Signed)
Pupils 3, sluggish.

## 2018-04-01 NOTE — ED Notes (Signed)
Notified provider of pt's low body temp & the attempt made to keep him warm with blankets. He denied needing to use any heating measures such a Lawyer.

## 2018-04-01 NOTE — ED Notes (Signed)
Pt Is getting an Echo at this time.

## 2018-04-01 NOTE — Progress Notes (Signed)
RT NOTE:  Pt was transported to 2H19 from ER on ventilator.

## 2018-04-01 NOTE — ED Triage Notes (Signed)
Pt here via [facility] from home after cardiac arrest.  Family stated that pt had been laying in bed and lethargic the last few days.  However, this am he was breathing, but unresponsive at 0805.  When fire arrived pt was pulseless and apneic with a rhythm of pea.  ROSC after 3 min cpr.  Pt was given 0.4 narcan and 1000 ns for bp 96/66.  cbg 116.  Upon arrival, rr 6, pupils 3, equal and unreactive.

## 2018-04-01 NOTE — ED Notes (Signed)
Respiratory called for transfer assistance to bring pt to 2H.

## 2018-04-01 NOTE — Progress Notes (Signed)
  Echocardiogram 2D Echocardiogram has been performed.  Pieter Partridge 04/01/2018, 4:56 PM

## 2018-04-01 NOTE — Progress Notes (Signed)
RT NOTE:  Transported pt to CT and back on ventilator. Pt is stable in his room.

## 2018-04-01 NOTE — ED Notes (Signed)
Pupils 3 and sluggish.

## 2018-04-01 NOTE — ED Notes (Signed)
Report was attempted to call to 2H, was informed someone will call back after a code finished.

## 2018-04-01 NOTE — ED Provider Notes (Addendum)
[facility] EMERGENCY DEPARTMENT Provider Note   CSN: [id number] Arrival date & time: 04/01/18  4098    History   Chief Complaint Chief Complaint  Patient presents with  . post arrest    HPI David Dennis is a 53 y.o. male.     The history is provided by the EMS personnel and medical records. No language interpreter was used.  Cardiac Arrest  Witnessed by:  Not witnessed Incident location:  Home Condition upon EMS arrival:  Agonal respirations Pulse:  Absent Initial cardiac rhythm per EMS:  PEA Airway:  Spontaneous respirations Rhythm on admission to ED:  Normal sinus Risk factors: drug overdose (possible?)   Risk factors: no trauma   Altered Mental Status  Severity:  Severe Most recent episode:  Today Episode history:  Single Timing:  Constant Progression:  Unchanged Chronicity:  New   LVL 5 caveat for cardiac arrest and unresponsive  Past Medical History:  Diagnosis Date  . Depression   . Generalized anxiety disorder   . Hypertension     There are no active problems to display for this patient.   No past surgical history on file.      Home Medications    Prior to Admission medications   Medication Sig Start Date End Date Taking? Authorizing Provider  amLODipine (NORVASC) 10 MG tablet Take 10 mg by mouth daily.    [provider]  chlordiazePOXIDE (LIBRIUM) 25 MG capsule 50mg  PO TID x 1D, then 25-50mg  PO BID X 1D, then 25-50mg  PO QD X 1D 08/10/15   Melene Plan, DO  clonazePAM (KLONOPIN) 1 MG tablet Take 0.5 mg by mouth 2 (two) times daily.    [provider]  levETIRAcetam (KEPPRA) 500 MG tablet Take 1 tablet (500 mg total) by mouth 2 (two) times daily. 08/10/15   Melene Plan, DO  metoprolol (LOPRESSOR) 50 MG tablet Take 50 mg by mouth 2 (two) times daily.    [provider]  Multiple Vitamins-Minerals (MULTIVITAL PO) Take 1 tablet by mouth daily.    [provider]  OLANZapine (ZYPREXA) 20  MG tablet Take 20 mg by mouth at bedtime.    [provider]  venlafaxine XR (EFFEXOR-XR) 150 MG 24 hr capsule Take 150 mg by mouth daily with breakfast.    [provider]    Family History No family history on file.  Social History Social History   Tobacco Use  . Smoking status: Never Smoker  Substance Use Topics  . Alcohol use: Yes    Alcohol/week: 84.0 standard drinks    Types: 84 Cans of beer per week    Comment: "about a 12 pack of beer everyday"  . Drug use: No     Allergies   Patient has no known allergies.   Review of Systems Review of Systems  Unable to perform ROS: Patient unresponsive     Physical Exam Updated Vital Signs BP 94/69 (BP Location: Right Arm)   Pulse 60   Temp (!) 96.8 F (36 C) (Temporal)   Resp (!) 6   SpO2 96%   Physical Exam Vitals signs and nursing note reviewed.  Constitutional:      Appearance: He is ill-appearing. He is not toxic-appearing or diaphoretic.  HENT:     Head: Normocephalic.     Nose: No congestion or rhinorrhea.     Mouth/Throat:     Mouth: Mucous membranes are moist.     Pharynx: No oropharyngeal exudate or posterior oropharyngeal  erythema.  Eyes:     Conjunctiva/sclera: Conjunctivae normal.     Pupils: Pupils are equal, round, and reactive to light.     Comments: 3mm reactive bilaterally   Neck:     Musculoskeletal: No muscular tenderness.  Cardiovascular:     Rate and Rhythm: Normal rate.     Pulses: Normal pulses.     Heart sounds: No murmur.  Pulmonary:     Effort: Respiratory distress (bradypnea) present.     Breath sounds: No stridor. Rhonchi present. No wheezing or rales.  Chest:     Chest wall: No tenderness.  Abdominal:     General: Abdomen is flat.     Tenderness: There is no abdominal tenderness.  Musculoskeletal:        General: No tenderness.  Skin:    Capillary Refill: Capillary refill takes less than 2 seconds.     Coloration: Skin is not pale.     Findings: No  rash.  Neurological:     Mental Status: He is unresponsive.     GCS: GCS eye subscore is 1. GCS verbal subscore is 1. GCS motor subscore is 1.     Comments: GCS of 3 on arrival.  Patient clearly unresponsive to pain.  Agonal respirations with unsecure airway.        ED Treatments / Results  Labs (all labs ordered are listed, but only abnormal results are displayed) Labs Reviewed  BASIC METABOLIC PANEL - Abnormal; Notable for the following components:      Result Value   Potassium 3.3 (*)    Chloride 112 (*)    BUN 24 (*)    Creatinine, Ser 2.12 (*)    Calcium 8.3 (*)    GFR calc non Af Amer 35 (*)    GFR calc Af Amer 41 (*)    All other components within normal limits  CBC - Abnormal; Notable for the following components:   RBC 3.13 (*)    Hemoglobin 10.2 (*)    HCT 31.3 (*)    Platelets 131 (*)    All other components within normal limits  RAPID URINE DRUG SCREEN, HOSP PERFORMED - Abnormal; Notable for the following components:   Benzodiazepines POSITIVE (*)    All other components within normal limits  HEPATIC FUNCTION PANEL - Abnormal; Notable for the following components:   Total Protein 5.6 (*)    Albumin 3.1 (*)    All other components within normal limits  ACETAMINOPHEN LEVEL - Abnormal; Notable for the following components:   Acetaminophen (Tylenol), Serum <10 (*)    All other components within normal limits  POCT I-STAT 7, (LYTES, BLD GAS, ICA,H+H) - Abnormal; Notable for the following components:   pH, Arterial 7.456 (*)    pO2, Arterial 390.0 (*)    Acid-Base Excess 3.0 (*)    Potassium 3.4 (*)    HCT 30.0 (*)    Hemoglobin 10.2 (*)    All other components within normal limits  CULTURE, BLOOD (ROUTINE X 2)  CULTURE, BLOOD (ROUTINE X 2)  URINE CULTURE  CULTURE, RESPIRATORY  SALICYLATE LEVEL  ETHANOL  MAGNESIUM  HIV ANTIBODY (ROUTINE TESTING W REFLEX)  TROPONIN I  TROPONIN I  CBC  BASIC METABOLIC PANEL  BLOOD GAS, ARTERIAL  MAGNESIUM  PHOSPHORUS   I-STAT TROPONIN, ED  CBG MONITORING, ED    EKG EKG Interpretation  Date/Time:  Wednesday April 01 2018 09:22:56 EST Ventricular Rate:  61 PR Interval:    QRS Duration: 134 QT Interval:  474 QTC Calculation: 478 R Axis:   58 Text Interpretation:  Sinus rhythm IVCD, consider atypical RBBB When comapred to prior, no signiciatn changes seen.  No STEMI Confirmed by Theda Belfast ([id number]) on 04/01/2018 9:34:45 AM Also confirmed by Theda Belfast ([id number]), editor Barbette Hair [phone])  on 04/01/2018 10:13:03 AM   Radiology Ct Head Wo Contrast  Result Date: 04/01/2018 CLINICAL DATA:  Post CPR EXAM: CT HEAD WITHOUT CONTRAST TECHNIQUE: Contiguous axial images were obtained from the base of the skull through the vertex without intravenous contrast. COMPARISON:  08/10/2015 FINDINGS: Brain: No evidence of acute infarction, hemorrhage, hydrocephalus, extra-axial collection or mass lesion/mass effect. Vascular: No hyperdense vessel or unexpected calcification. Skull: Normal. Negative for fracture or focal lesion. Sinuses/Orbits: No acute finding. Other: None. IMPRESSION: No acute intracranial pathology. Electronically Signed   By: Lauralyn Primes M.D.   On: 04/01/2018 10:51   Dg Chest Portable 1 View  Result Date: 04/01/2018 CLINICAL DATA:  Status post intubation. EXAM: PORTABLE CHEST 1 VIEW COMPARISON:  None. FINDINGS: The heart size and mediastinal contours are within normal limits. Both lungs are clear. Endotracheal tube is seen projected over tracheal air shadow with distal tip 5 cm above the carina. Distal tip of nasogastric tube is seen in proximal stomach. The visualized skeletal structures are unremarkable. IMPRESSION: Endotracheal tube in grossly good position. Distal tip of nasogastric tube in proximal stomach. No acute cardiopulmonary abnormality seen. Electronically Signed   By: Lupita Raider, M.D.   On: 04/01/2018 10:20    Procedures Procedure Name: Intubation Date/Time: 04/01/2018 5:25  PM Performed by: Heide Scales, MD Pre-anesthesia Checklist: Patient identified, Patient being monitored, Emergency Drugs available, Timeout performed and Suction available Oxygen Delivery Method: Ambu bag Preoxygenation: Pre-oxygenation with 100% oxygen Induction Type: Rapid sequence Ventilation: Mask ventilation without difficulty Grade View: Grade I Tube size: 7.5 mm Number of attempts: 1 Airway Equipment and Method: Video-laryngoscopy Placement Confirmation: ETT inserted through vocal cords under direct vision,  CO2 detector and Breath sounds checked- equal and bilateral Tube secured with: ETT holder Dental Injury: Teeth and Oropharynx as per pre-operative assessment       (including critical care time)  CRITICAL CARE Performed by: Canary Brim Nicanor Mendolia Total critical care time: 60 minutes Critical care time was exclusive of separately billable procedures and treating other patients. Critical care was necessary to treat or prevent imminent or life-threatening deterioration. Critical care was time spent personally by me on the following activities: development of treatment plan with patient and/or surrogate as well as nursing, discussions with consultants, evaluation of patient's response to treatment, examination of patient, obtaining history from patient or surrogate, ordering and performing treatments and interventions, ordering and review of laboratory studies, ordering and review of radiographic studies, pulse oximetry and re-evaluation of patient's condition.   Medications Ordered in ED Medications  heparin injection 5,000 Units (has no administration in time range)  0.9 %  sodium chloride infusion ( Intravenous New Bag/Given 04/01/18 1115)  levETIRAcetam (KEPPRA) IVPB 500 mg/100 mL premix (has no administration in time range)  fentaNYL (SUBLIMAZE) injection 50 mcg (has no administration in time range)  fentaNYL in NS (54mcg/ml) infusion-PREMIX (has no  administration in time range)  fentaNYL (SUBLIMAZE) bolus via infusion 50 mcg (has no administration in time range)  midazolam (VERSED) injection 2 mg (has no administration in time range)  midazolam (VERSED) injection 2 mg (has no administration in time range)  etomidate (AMIDATE) injection (20 mg Intravenous Given 04/01/18 0924)  rocuronium (ZEMURON)  injection (70 mg Intravenous Given 04/01/18 0925)  sodium chloride flush (NS) 0.9 % injection 3 mL (3 mLs Intravenous Given 04/01/18 1050)  0.9 %  sodium chloride infusion ( Intravenous Stopped 04/01/18 1050)  sodium chloride 0.9 % bolus 1,000 mL ( Intravenous Canceled Entry 04/01/18 1003)  sodium bicarbonate injection 50 mEq (50 mEq Intravenous Given 04/01/18 1056)  levETIRAcetam (KEPPRA) IVPB 1000 mg/100 mL premix (0 mg Intravenous Stopped 04/01/18 1152)  potassium chloride 20 MEQ/15ML (10%) solution 40 mEq (40 mEq Per Tube Given 04/01/18 1256)  magnesium sulfate IVPB 2 g 50 mL (0 g Intravenous Stopped 04/01/18 1350)     Initial Impression / Assessment and Plan / ED Course  I have reviewed the triage vital signs and the nursing notes.  Pertinent labs & imaging results that were available during my care of the patient were reviewed by me and considered in my medical decision making (see chart for details).        David Dennis is a 53 y.o. male with a past medical history significant for hypertension, depression, and anxiety who presents as a post arrest.  Patient brought in by EMS.  According to EMS report from family, patient has been acting sleepy the last few days and they are unsure what time he went to bed.  They report this morning they went to wake him up and he was unresponsive.  According to EMS report from fire, patient was found pulseless and apneic.  Patient had 3 minutes of CPR with no medications or shockable rhythm.  Patient then had return of spontaneous circulation.  When EMS arrived, patient was breathing on his own and had  a gag reflex.  Patient was transported on the nonrebreather.  Therefore they attempted to use a King airway but patient started gagging and they decided to transport with nonrebreather in place.  On arrival, patient is in sinus rhythm.  Blood pressure is around 100 systolic.  Patient is GCS is 3 and he is completely unresponsive to any voice, or painful stimulation.  Patient no longer has a gag.  Patient is having some gurgling in his respirations, do not feel he is protecting his airway.  Decision made to intubate.  RSI was utilized and patient was given etomidate and rocuronium.  Patient was intubated without difficulty and post intubation x-ray confirmed tube placement.  Patient had improvement in his blood pressure with fluids.  EMS reports that patient had 3 empty bottles of medications in the room although it is unclear if this was a suicide attempt.  Patient had an empty bottle of olanzapine, 50 mg tablets of metoprolol, and 0.5 mg tablets of Klonopin.  Family has not been available to provide further information at this time.  Please control be called.  EKG did not show STEMI or significant arrhythmia at this time.  He was not bradycardic.  QRS was slightly widened.  Per poison control, patient will be given sodium bicarb.  This was ordered.  Critical care called after intubation for further management.  Will obtain CT imaging labs and further work-up.  Patient will be admitted for further management of cardiac arrest and possible overdose.  CT head showed no acute intracranial normality.  Laboratory testing did not show any significant normalities initially.  Critical care will admit for further management.  Final Clinical Impressions(s) / ED Diagnoses   Final diagnoses:  Cardiac arrest (HCC)  Altered mental status, unspecified altered mental status type    ED Discharge Orders  None      Clinical Impression: 1. Cardiac arrest (HCC)   2. Altered mental status, unspecified  altered mental status type     Disposition: Admit  This note was prepared with assistance of Dragon voice recognition software. Occasional wrong-word or sound-a-like substitutions may have occurred due to the inherent limitations of voice recognition software.     Kirt Chew, Canary Brimhristopher J, MD 04/01/18 1718    Prajwal Fellner, Canary Brimhristopher J, MD 04/01/18 1726

## 2018-04-02 ENCOUNTER — Inpatient Hospital Stay (HOSPITAL_COMMUNITY): Payer: Non-veteran care

## 2018-04-02 DIAGNOSIS — J9601 Acute respiratory failure with hypoxia: Secondary | ICD-10-CM

## 2018-04-02 DIAGNOSIS — N179 Acute kidney failure, unspecified: Secondary | ICD-10-CM

## 2018-04-02 DIAGNOSIS — R4182 Altered mental status, unspecified: Secondary | ICD-10-CM

## 2018-04-02 DIAGNOSIS — I5021 Acute systolic (congestive) heart failure: Secondary | ICD-10-CM

## 2018-04-02 DIAGNOSIS — T50904A Poisoning by unspecified drugs, medicaments and biological substances, undetermined, initial encounter: Secondary | ICD-10-CM

## 2018-04-02 LAB — URINALYSIS, ROUTINE W REFLEX MICROSCOPIC
Bilirubin Urine: NEGATIVE
Glucose, UA: NEGATIVE mg/dL
Ketones, ur: NEGATIVE mg/dL
Nitrite: NEGATIVE
PH: 5 (ref 5.0–8.0)
Protein, ur: 30 mg/dL — AB
Specific Gravity, Urine: 1.013 (ref 1.005–1.030)

## 2018-04-02 LAB — BASIC METABOLIC PANEL
Anion gap: 9 (ref 5–15)
Anion gap: 11 (ref 5–15)
Anion gap: 15 (ref 5–15)
Anion gap: 14 (ref 5–15)
BUN: 35 mg/dL — ABNORMAL HIGH (ref 6–20)
BUN: 37 mg/dL — ABNORMAL HIGH (ref 6–20)
BUN: 47 mg/dL — ABNORMAL HIGH (ref 6–20)
BUN: 54 mg/dL — ABNORMAL HIGH (ref 6–20)
CHLORIDE: 112 mmol/L — AB (ref 98–111)
CO2: 24 mmol/L (ref 22–32)
CO2: 20 mmol/L — ABNORMAL LOW (ref 22–32)
CO2: 17 mmol/L — ABNORMAL LOW (ref 22–32)
CO2: 17 mmol/L — ABNORMAL LOW (ref 22–32)
Calcium: 8.6 mg/dL — ABNORMAL LOW (ref 8.9–10.3)
Calcium: 8.5 mg/dL — ABNORMAL LOW (ref 8.9–10.3)
Calcium: 8.2 mg/dL — ABNORMAL LOW (ref 8.9–10.3)
Calcium: 8.2 mg/dL — ABNORMAL LOW (ref 8.9–10.3)
Chloride: 112 mmol/L — ABNORMAL HIGH (ref 98–111)
Chloride: 112 mmol/L — ABNORMAL HIGH (ref 98–111)
Chloride: 113 mmol/L — ABNORMAL HIGH (ref 98–111)
Creatinine, Ser: 3.13 mg/dL — ABNORMAL HIGH (ref 0.61–1.24)
Creatinine, Ser: 3.71 mg/dL — ABNORMAL HIGH (ref 0.61–1.24)
Creatinine, Ser: 4.65 mg/dL — ABNORMAL HIGH (ref 0.61–1.24)
Creatinine, Ser: 5.28 mg/dL — ABNORMAL HIGH (ref 0.61–1.24)
GFR calc Af Amer: 25 mL/min — ABNORMAL LOW (ref >60)
GFR calc Af Amer: 21 mL/min — ABNORMAL LOW (ref >60)
GFR calc Af Amer: 16 mL/min — ABNORMAL LOW (ref >60)
GFR calc Af Amer: 13 mL/min — ABNORMAL LOW (ref >60)
GFR calc non Af Amer: 22 mL/min — ABNORMAL LOW (ref >60)
GFR calc non Af Amer: 18 mL/min — ABNORMAL LOW (ref >60)
GFR calc non Af Amer: 14 mL/min — ABNORMAL LOW (ref >60)
GFR calc non Af Amer: 12 mL/min — ABNORMAL LOW (ref >60)
Glucose, Bld: 103 mg/dL — ABNORMAL HIGH (ref 70–99)
Glucose, Bld: 93 mg/dL (ref 70–99)
Glucose, Bld: 145 mg/dL — ABNORMAL HIGH (ref 70–99)
Glucose, Bld: 147 mg/dL — ABNORMAL HIGH (ref 70–99)
POTASSIUM: 6.1 mmol/L — AB (ref 3.5–5.1)
POTASSIUM: 6 mmol/L — AB (ref 3.5–5.1)
Potassium: 5 mmol/L (ref 3.5–5.1)
Potassium: 5.2 mmol/L — ABNORMAL HIGH (ref 3.5–5.1)
SODIUM: 145 mmol/L (ref 135–145)
Sodium: 143 mmol/L (ref 135–145)
Sodium: 145 mmol/L (ref 135–145)
Sodium: 143 mmol/L (ref 135–145)

## 2018-04-02 LAB — NA AND K (SODIUM & POTASSIUM), RAND UR
POTASSIUM UR: 51 mmol/L
Potassium Urine: 61 mmol/L
Sodium, Ur: 65 mmol/L
Sodium, Ur: 82 mmol/L

## 2018-04-02 LAB — RESPIRATORY PANEL BY PCR
Adenovirus: NOT DETECTED
Bordetella pertussis: NOT DETECTED
Chlamydophila pneumoniae: NOT DETECTED
Coronavirus 229E: NOT DETECTED
Coronavirus HKU1: NOT DETECTED
Coronavirus NL63: NOT DETECTED
Coronavirus OC43: NOT DETECTED
Influenza A: NOT DETECTED
Influenza B: NOT DETECTED
Metapneumovirus: NOT DETECTED
Mycoplasma pneumoniae: NOT DETECTED
Parainfluenza Virus 1: NOT DETECTED
Parainfluenza Virus 2: NOT DETECTED
Parainfluenza Virus 3: NOT DETECTED
Parainfluenza Virus 4: NOT DETECTED
RESPIRATORY SYNCYTIAL VIRUS-RVPPCR: NOT DETECTED
Rhinovirus / Enterovirus: NOT DETECTED

## 2018-04-02 LAB — GLUCOSE, CAPILLARY
Glucose-Capillary: 48 mg/dL — ABNORMAL LOW (ref 70–99)
Glucose-Capillary: 104 mg/dL — ABNORMAL HIGH (ref 70–99)
Glucose-Capillary: 87 mg/dL (ref 70–99)
Glucose-Capillary: 58 mg/dL — ABNORMAL LOW (ref 70–99)
Glucose-Capillary: 126 mg/dL — ABNORMAL HIGH (ref 70–99)
Glucose-Capillary: 177 mg/dL — ABNORMAL HIGH (ref 70–99)
Glucose-Capillary: 113 mg/dL — ABNORMAL HIGH (ref 70–99)
Glucose-Capillary: 129 mg/dL — ABNORMAL HIGH (ref 70–99)
Glucose-Capillary: 145 mg/dL — ABNORMAL HIGH (ref 70–99)
Glucose-Capillary: 122 mg/dL — ABNORMAL HIGH (ref 70–99)

## 2018-04-02 LAB — PROCALCITONIN: PROCALCITONIN: 14.82 ng/mL

## 2018-04-02 LAB — CK: Total CK: 738 U/L — ABNORMAL HIGH (ref 49–397)

## 2018-04-02 LAB — LACTIC ACID, PLASMA
Lactic Acid, Venous: 3.9 mmol/L (ref 0.5–1.9)
Lactic Acid, Venous: 2.7 mmol/L (ref 0.5–1.9)

## 2018-04-02 LAB — CBC
HCT: 42.2 % (ref 39.0–52.0)
HEMOGLOBIN: 14.2 g/dL (ref 13.0–17.0)
MCH: 33.1 pg (ref 26.0–34.0)
MCHC: 33.6 g/dL (ref 30.0–36.0)
MCV: 98.4 fL (ref 80.0–100.0)
NRBC: 0 % (ref 0.0–0.2)
Platelets: 54 10*3/uL — ABNORMAL LOW (ref 150–400)
RBC: 4.29 MIL/uL (ref 4.22–5.81)
RDW: 13.6 % (ref 11.5–15.5)
WBC: 15.3 10*3/uL — ABNORMAL HIGH (ref 4.0–10.5)

## 2018-04-02 LAB — URINE CULTURE: Culture: NO GROWTH

## 2018-04-02 LAB — POCT I-STAT 7, (LYTES, BLD GAS, ICA,H+H)
Acid-base deficit: 6 mmol/L — ABNORMAL HIGH (ref 0.0–2.0)
Bicarbonate: 19 mmol/L — ABNORMAL LOW (ref 20.0–28.0)
Calcium, Ion: 1.19 mmol/L (ref 1.15–1.40)
HCT: 36 % — ABNORMAL LOW (ref 39.0–52.0)
Hemoglobin: 12.2 g/dL — ABNORMAL LOW (ref 13.0–17.0)
O2 Saturation: 96 %
Patient temperature: 38.4
Potassium: 6.2 mmol/L — ABNORMAL HIGH (ref 3.5–5.1)
Sodium: 143 mmol/L (ref 135–145)
TCO2: 20 mmol/L — ABNORMAL LOW (ref 22–32)
pCO2 arterial: 37.3 mmHg (ref 32.0–48.0)
pH, Arterial: 7.321 — ABNORMAL LOW (ref 7.350–7.450)
pO2, Arterial: 97 mmHg (ref 83.0–108.0)

## 2018-04-02 LAB — PHOSPHORUS: PHOSPHORUS: 6 mg/dL — AB (ref 2.5–4.6)

## 2018-04-02 LAB — TROPONIN I
Troponin I: UNDETERMINED ng/mL (ref <0.03)
Troponin I: 0.1 ng/mL (ref <0.03)
Troponin I: 0.14 ng/mL (ref <0.03)

## 2018-04-02 LAB — BRAIN NATRIURETIC PEPTIDE: B Natriuretic Peptide: 257.3 pg/mL — ABNORMAL HIGH (ref 0.0–100.0)

## 2018-04-02 LAB — MAGNESIUM
Magnesium: 2.7 mg/dL — ABNORMAL HIGH (ref 1.7–2.4)
Magnesium: 2.9 mg/dL — ABNORMAL HIGH (ref 1.7–2.4)

## 2018-04-02 LAB — POTASSIUM
POTASSIUM: 4.5 mmol/L (ref 3.5–5.1)
Potassium: 6.1 mmol/L — ABNORMAL HIGH (ref 3.5–5.1)

## 2018-04-02 LAB — HIV ANTIBODY (ROUTINE TESTING W REFLEX): HIV Screen 4th Generation wRfx: NONREACTIVE

## 2018-04-02 MED ORDER — ALBUTEROL SULFATE (2.5 MG/3ML) 0.083% IN NEBU
INHALATION_SOLUTION | RESPIRATORY_TRACT | Status: AC
  Filled 2018-04-02: qty 12

## 2018-04-02 MED ORDER — VITAL 1.5 CAL PO LIQD
1000.0000 mL | ORAL | Status: DC
  Administered 2018-04-02: 1000 mL
  Filled 2018-04-02 (×3): qty 1000

## 2018-04-02 MED ORDER — SODIUM CHLORIDE 0.9 % IV SOLN
250.0000 mL | INTRAVENOUS | Status: DC
  Administered 2018-04-02: 250 mL via INTRAVENOUS

## 2018-04-02 MED ORDER — INSULIN ASPART 100 UNIT/ML IV SOLN
5.0000 [IU] | Freq: Once | INTRAVENOUS | Status: AC
  Administered 2018-04-02: 5 [IU] via INTRAVENOUS

## 2018-04-02 MED ORDER — PRO-STAT SUGAR FREE PO LIQD
30.0000 mL | Freq: Two times a day (BID) | ORAL | Status: DC
  Administered 2018-04-02 – 2018-04-03 (×4): 30 mL
  Filled 2018-04-02 (×4): qty 30

## 2018-04-02 MED ORDER — GLUCAGON HCL RDNA (DIAGNOSTIC) 1 MG IJ SOLR
1.0000 mg | Freq: Once | INTRAMUSCULAR | Status: AC
  Administered 2018-04-02: 1 mg via INTRAVENOUS
  Filled 2018-04-02 (×2): qty 1

## 2018-04-02 MED ORDER — DEXTROSE 50 % IV SOLN
INTRAVENOUS | Status: AC
  Administered 2018-04-02: 25 mL via INTRAVENOUS
  Filled 2018-04-02: qty 50

## 2018-04-02 MED ORDER — DEXTROSE 50 % IV SOLN
25.0000 mL | Freq: Once | INTRAVENOUS | Status: AC
  Administered 2018-04-02: 25 mL via INTRAVENOUS

## 2018-04-02 MED ORDER — ACETAMINOPHEN 500 MG PO TABS
500.0000 mg | ORAL_TABLET | Freq: Four times a day (QID) | ORAL | Status: DC | PRN
  Administered 2018-04-02: 500 mg via NASOGASTRIC
  Filled 2018-04-02: qty 1

## 2018-04-02 MED ORDER — DEXTROSE 50 % IV SOLN
1.0000 | Freq: Once | INTRAVENOUS | Status: AC
  Administered 2018-04-02: 50 mL via INTRAVENOUS
  Filled 2018-04-02: qty 50

## 2018-04-02 MED ORDER — SODIUM POLYSTYRENE SULFONATE 15 GM/60ML PO SUSP
30.0000 g | ORAL | Status: AC
  Administered 2018-04-02: 30 g
  Filled 2018-04-02: qty 120

## 2018-04-02 MED ORDER — LACTATED RINGERS IV BOLUS
1000.0000 mL | Freq: Once | INTRAVENOUS | Status: AC
  Administered 2018-04-02: 1000 mL via INTRAVENOUS

## 2018-04-02 MED ORDER — SODIUM BICARBONATE 8.4 % IV SOLN
50.0000 meq | Freq: Once | INTRAVENOUS | Status: AC
  Administered 2018-04-02: 50 meq via INTRAVENOUS
  Filled 2018-04-02: qty 50

## 2018-04-02 MED ORDER — NOREPINEPHRINE 4 MG/250ML-% IV SOLN: 2.0000 ug/min | INTRAVENOUS | Status: DC

## 2018-04-02 MED ORDER — FUROSEMIDE 10 MG/ML IJ SOLN
80.0000 mg | Freq: Once | INTRAMUSCULAR | Status: AC
  Administered 2018-04-02: 80 mg via INTRAVENOUS
  Filled 2018-04-02: qty 8

## 2018-04-02 MED ORDER — GLUCAGON HCL RDNA (DIAGNOSTIC) 1 MG IJ SOLR
1.0000 mg | Freq: Once | INTRAMUSCULAR | Status: AC
  Administered 2018-04-02: 1 mg via INTRAVENOUS
  Filled 2018-04-02: qty 1

## 2018-04-02 MED ORDER — DEXTROSE 10 % IV SOLN
INTRAVENOUS | Status: DC
  Administered 2018-04-02 – 2018-04-04 (×2): via INTRAVENOUS

## 2018-04-02 MED ORDER — VITAL HIGH PROTEIN PO LIQD
1000.0000 mL | ORAL | Status: DC
  Administered 2018-04-02: 1000 mL

## 2018-04-02 MED ORDER — SODIUM POLYSTYRENE SULFONATE 15 GM/60ML PO SUSP: 30.0000 g | Freq: Once | ORAL | Status: DC

## 2018-04-02 MED ORDER — ALBUTEROL SULFATE (2.5 MG/3ML) 0.083% IN NEBU
10.0000 mg | INHALATION_SOLUTION | Freq: Once | RESPIRATORY_TRACT | Status: AC
  Administered 2018-04-02: 10 mg via RESPIRATORY_TRACT
  Filled 2018-04-02: qty 12

## 2018-04-02 MED ORDER — DEXTROSE-NACL 5-0.9 % IV SOLN
INTRAVENOUS | Status: DC
  Administered 2018-04-02: 02:00:00 via INTRAVENOUS

## 2018-04-02 NOTE — Progress Notes (Signed)
Initial Nutrition Assessment  DOCUMENTATION CODES:   Not applicable  INTERVENTION:   TF via OG tube: - Vital 1.5 @ 55 ml/hr (1320 ml/day) - Pro-stat 30 ml BID  Tube feeding regimen provides 2180 kcal, 119 grams of protein, and 1008 ml of H2O (100% of needs).  NUTRITION DIAGNOSIS:   Inadequate oral intake related to inability to eat as evidenced by NPO status.  GOAL:   Patient will meet greater than or equal to 90% of their needs  MONITOR:   Vent status, Weight trends, TF tolerance, Labs, I & O's  REASON FOR ASSESSMENT:   Ventilator, Consult Enteral/tube feeding initiation and management  ASSESSMENT:   53 year old male who presented to the ED on 2/19 after experiencing a cardiac arrest. CPR x 3 minutes with ROSC. Empty medication bottles were found in pt's room including metoprolol, clonazepam, and olanzapine. Concern for overdose. Pt was intubated in the ED. PMH significant for HTN, depression, and anxiety.   Discussed pt with RN. Pt weaning at this time. No family present at time of visit so unable to obtain diet and weight history. Noted pt with suspected EtOH abuse.  Based on NFPE, pt meets criteria for malnutrition (mild fat depletions and mild to moderate muscle depletions) but etiology unclear at this time without a more detailed diet and weight history. Will attempt to obtain this information from pt and/or family at follow-up.  Current TF ordered per Adult ICU TF Protocol: Vital High Protein @ 40 ml/hr, Pro-stat 30 ml BID  Patient is currently intubated on ventilator support MV: 12.0 L/min Temp (24hrs), Avg:96.4 F (35.8 C), Min:92.1 F (33.4 C), Max:101.5 F (38.6 C)  Propofol: none Dextrose 10%: 10 ml/hr (provides 82 kcal daily)  Medications reviewed and include: IV antibiotics, Keppra  Labs reviewed: potassium 6.1 (H), BUN 37 (H), creatinine 3.71 (H), phosphorus 6.0 (H), magnesium 2.9 (H) CBG's: 177, 126, 58, 87, 104 x 12 hours  UOP: 1770 ml x 24  hours  NUTRITION - FOCUSED PHYSICAL EXAM:    Most Recent Value  Orbital Region  Mild depletion  Upper Arm Region  Mild depletion  Thoracic and Lumbar Region  Mild depletion  Buccal Region  Unable to assess  Temple Region  Mild depletion  Clavicle Bone Region  Moderate depletion  Clavicle and Acromion Bone Region  Moderate depletion  Scapular Bone Region  Unable to assess  Dorsal Hand  No depletion  Patellar Region  No depletion  Anterior Thigh Region  No depletion  Posterior Calf Region  Mild depletion  Edema (RD Assessment)  None  Hair  Reviewed  Eyes  Unable to assess  Mouth  Unable to assess  Skin  Reviewed  Nails  Reviewed       Diet Order:   Diet Order            Diet NPO time specified  Diet effective now              EDUCATION NEEDS:   Not appropriate for education at this time  Skin:  Skin Assessment: Reviewed RN Assessment  Last BM:  PTA/unknown  Height:   Ht Readings from Last 1 Encounters:  04/01/18 6\' 2"  (1.88 m)    Weight:   Wt Readings from Last 1 Encounters:  04/01/18 72 kg    Ideal Body Weight:  86.4 kg  BMI:  Body mass index is 20.38 kg/m.  Estimated Nutritional Needs:   Kcal:  2189  Protein:  110-125 grams  Fluid:  >/=  2.0 L    Gaynell Face, MS, RD, LDN Inpatient Clinical Dietitian Pager: [phone] Weekend/After Hours: [phone]

## 2018-04-02 NOTE — Progress Notes (Addendum)
eLink Physician-Brief Progress Note Patient Name: David Dennis DOB: 01/20/1966 MRN: [id number]   Date of Service  04/02/2018  HPI/Events of Note  Hypoglycemia. On Levo at 10. MAP good. UOP down. AKI. K at 5. Discussed with RN.  eICU Interventions  - change NS to D5NS at 75 ml/hr. Low ef. Watch for CHF exacerbation. Glucogan 1 mg IV once stat.      Intervention Category Intermediate Interventions: Other:  [name] 04/02/2018, 2:09 AM   4:22 Camera: HR now 83. Sinus. MAP 76. sats fine. Continue care.

## 2018-04-02 NOTE — Progress Notes (Addendum)
NAME:  David Dennis, MRN:  [id number], DOB:  03/30/1965, LOS: 1 ADMISSION DATE:  04/01/2018, CONSULTATION DATE:  04/01/18 REFERRING MD:  Tegerler  CHIEF COMPLAINT:  Cardiac Arrest with probable overdose   Brief History   David Dennis is a 53 y.o. male with hx depression and anxiety who was admitted 2/19 after being found down, unresponsive, and pulseless.  He received 3 minutes of CPR prior to ROSC then intubated in ED.   Possible overdose of metoprolol, clonazepam, olanzapine; however, not confirmed as mother states that pt stopped taking his pills "cold [place]" in Nov 2019).   Past Medical History  HTN, depression, anxiety.  Significant Hospital Events   2/19 Admit  Consults:  PCCM  Procedures:  ETT 2/19 >   Significant Diagnostic Tests:  CT head 2/19 > negative. Echo 2/19 > LVEF 20-25%, LV mildly dilated, LV diffuse hypokinesis, RV moderately reduced systolic function, RA pressure . Mild PR EEG 2/20 >>  Micro Data:  Blood 2/19 >  Sputum 2/19 >  UA 2/19 > negative  RVP 2/20 >  Antimicrobials:  Ceftriaxone 2/19 x1   Interim history/subjective:  RN reports no acute events. Pt weaning on PSV.  EEG being set up. Mom states patient could have laid / "been asleep" for up to a day in bed.  Tmax 101.5  Objective:  Blood pressure (!) 128/103, pulse 82, temperature (!) 101.5 F (38.6 C), resp. rate (!) 23, height 6\' 2"  (1.88 m), weight 72 kg, SpO2 94 %.    Vent Mode: PSV;CPAP FiO2 (%):  [40 %-100 %] 40 % Set Rate:  [16 bmp] 16 bmp Vt Set:  [650 mL] 650 mL PEEP:  [5 cmH20] 5 cmH20 Pressure Support:  [12 cmH20] 12 cmH20 Plateau Pressure:  [15 cmH20-24 cmH20] 21 cmH20   Intake/Output Summary (Last 24 hours) at 04/02/2018 1607 Last data filed at 04/02/2018 0700 Gross per 24 hour  Intake 2348.92 ml  Output 1770 ml  Net 578.92 ml   Filed Weights   04/01/18 0923  Weight: 72 kg    Examination: General: young adult male lying in bed in NAD on vent      HEENT: MM pink/moist, ETT, pupils 54mm / R Neuro: opens eyes to voice, makes eye contact, moves spontaneously  CV: s1s2 rrr, no m/r/g PULM: even/non-labored, lungs bilaterally clear  PX:TGGY, non-tender, bsx4 active  Extremities: warm/dry, no edema  Skin: no rashes or lesions  Assessment & Plan:   Cardiac Arrest  -despite mention of PEA, unsure if true arrest as pt was breathing appropriately just prior and only received CPR x 3 minutes without any epinephrine or other ACLS medications or defibrillations.  If true arrest, unclear etiology at this point.  QTc slightly prolonged at 478.  Possible overdose on metoprolol, olanzapine, clonazepam. LVEF 20%  -no baseline for comparision P: Continue ICU monitoring  Follow electrolytes  Assess EEG  ECHO as above with reduction of LVEF, hypokinesis ? Post arrest vs ischemic component with hx HTN, also note ETOH hx  Assess follow up troponin, BNP Levophed for MAP > 65  Have discussed with Cardiology > CHF service will see   Acute Respiratory Insufficiency  -due to inability to protect the airway in the setting of above P: PRVC 8cc/kg as rest mode  Wean PEEP/FiO2 for sats > 90% SBT / WUA as tolerated  Follow intermittent CXR  Await cultures, RVP   Fever -RVP pending, CXR clear, ? R/t cardiomyopathy/ cardiac event P: Follow  fever curve / WBC trend  Hold abx for now Assess PCT  AKI  -in setting of arrest, suspected overdose, r/o rhabdo P: Trend BMP / urinary output Replace electrolytes as indicated Avoid nephrotoxic agents, ensure adequate renal perfusion Assess CK to r/o rhabdo   Acute Metabolic Encephalopathy  -in setting of overdose, medications, AKI  P: Limit sedating medications as able  PRN sedation for RASS Goal: 0 to -1   Hx HTN P: Hold home agents   Hyperkalemia -s/p bicarb, dextrose, insulin, calcium 2/20 P: Kayexalate 30 gm x1 now Repeat Bicarb 50 mEq now  Follow up K this afternoon  Would consider lasix  but he has AKI, hold for now   Hx ETOH -reportedly drinks 12pk per day P: Monitor for withdrawal  May need precedex   At Risk Malnutrition  -hx ETOH, intubation / critical illness P: Begin TF  Hold PPI/H2 blocker given prolonged QTc for now / overdose   Best Practice:  Diet: NPO. Pain/Anxiety/Delirium protocol (if indicated): Fentanyl PRN / Midazolam PRN. RASS goal 0 to -1. VAP protocol (if indicated): In place. DVT prophylaxis: SCD's / Heparin. GI prophylaxis: None due to prolonged QTc. Glucose control: None. Mobility: Bedrset. Code Status: DNR, confirmed with pt's mother. Family Communication: Mother updated at bedside am 2/20 Disposition: ICU.  Labs   CBC: Recent Labs  Lab 04/01/18 0929 04/01/18 1026 04/02/18 0400  WBC 7.6  --   --   HGB 10.2* 10.2* 12.2*  HCT 31.3* 30.0* 36.0*  MCV 100.0  --   --   PLT 131*  --   --    Basic Metabolic Panel: Recent Labs  Lab 04/01/18 0929 04/01/18 1026 04/01/18 1207 04/01/18 2221 04/02/18 0308 04/02/18 0400  NA 144 144  --  145 143 143  K 3.3* 3.4*  --  5.0 6.1* 6.2*  CL 112*  --   --  112* 112*  --   CO2 25  --   --  24 20*  --   GLUCOSE 83  --   --  103* 93  --   BUN 24*  --   --  35* 37*  --   CREATININE 2.12*  --   --  3.13* 3.71*  --   CALCIUM 8.3*  --   --  8.6* 8.5*  --   MG  --   --  2.1 2.7* 2.9*  --   PHOS  --   --   --   --  6.0*  --    GFR: Estimated Creatinine Clearance: 24 mL/min (A) (by C-G formula based on SCr of 3.71 mg/dL (H)). Recent Labs  Lab 04/01/18 0929  WBC 7.6   Liver Function Tests: Recent Labs  Lab 04/01/18 0929  AST 15  ALT 12  ALKPHOS 49  BILITOT 0.7  PROT 5.6*  ALBUMIN 3.1*   No results for input(s): LIPASE, AMYLASE in the last 168 hours. No results for input(s): AMMONIA in the last 168 hours. ABG    Component Value Date/Time   PHART 7.321 (L) 04/02/2018 0400   PCO2ART 37.3 04/02/2018 0400   PO2ART 97.0 04/02/2018 0400   HCO3 19.0 (L) 04/02/2018 0400   TCO2 20 (L)  04/02/2018 0400   ACIDBASEDEF 6.0 (H) 04/02/2018 0400   O2SAT 96.0 04/02/2018 0400    Coagulation Profile: No results for input(s): INR, PROTIME in the last 168 hours.  Cardiac Enzymes: Recent Labs  Lab 04/01/18 1623 04/01/18 2103  TROPONINI <0.03 <0.03   HbA1C:  No results found for: HGBA1C   CBG: Recent Labs  Lab 04/02/18 0125 04/02/18 0147 04/02/18 0241 04/02/18 0423 04/02/18 0455  GLUCAP 48* 104* 87 58* 126*     Critical care time:  35 minutes     Canary BrimBrandi Jaleigha Deane, NP-C [facility] Pgr: [phone] or if no answer [phone] 04/02/2018, 8:39 AM

## 2018-04-02 NOTE — Consult Note (Addendum)
Advanced Heart Failure Team Consult Note   Primary Physician: [place] PCP-Cardiologist:  No primary care provider on file.  Reason for Consultation: Acute systolic CHF  HPI:    David Dennis is seen today for evaluation of Acute systolic CHF at the request of Dr. Loanne Drilling.   David Dennis is a 53 y.o. male with h/o depression/anxiety and seizures admitted via EMS after being found unresponsive. EMS found pulseless with PEA rhythm. Had CPR x 3 min with ROSC. Empty pill bottles found in room including metoprolol, clonazepam, and olanzapine. Intubated on arrival to the ED for airway protection, unresponsive off sedation.   Pertinent labs on admission included Cr 2.12 (baseline WNL), K 3.3, WBC 7.6, Hgb 10.2, and Troponin negative. Initial ABG pH 7.456, CO2 37.8, pO2 390, O2 sat 100  Remains intubated. Not responsive off sedation. EEG pending.  Tmax 101.5. Covering with rocephin for ? CAP. BCx pending.   Pt is apparently a DNR, but paperwork was not available at home so treated as full code. He is now DNR here.   ABG this am pH 7.321, pCO2 37.3, pO2 97, Bicarb 19, O2sat 96  Echo 04/01/18 LVEF 20-25%, RV moderately down, Mild LAE, Degenerative MV, MR mild by doppler.   ROS: Patient is encephalopathic and/or intubated. History has been obtained from chart review.    Home Medications Prior to Admission medications   Medication Sig Start Date End Date Taking? Authorizing Provider  amLODipine (NORVASC) 10 MG tablet Take 10 mg by mouth daily.    [provider]  chlordiazePOXIDE (LIBRIUM) 25 MG capsule 79m PO TID x 1D, then 25-573mPO BID X 1D, then 25-5016mO QD X 1D Patient not taking: Reported on 04/01/2018 08/10/15   FloDeno EtienneO  clonazePAM (KLONOPIN) 1 MG tablet Take 0.5 mg by mouth 2 (two) times daily.    [provider]  levETIRAcetam (KEPPRA) 500 MG tablet Take 1 tablet (500 mg total) by mouth 2 (two) times daily. Patient not  taking: Reported on 04/01/2018 08/10/15   FloDeno EtienneO  metoprolol (LOPRESSOR) 50 MG tablet Take 50 mg by mouth 2 (two) times daily.    [provider]  Multiple Vitamins-Minerals (MULTIVITAL PO) Take 1 tablet by mouth daily.    [provider]  OLANZapine (ZYPREXA) 20 MG tablet Take 20 mg by mouth at bedtime.    [provider]  venlafaxine XR (EFFEXOR-XR) 150 MG 24 hr capsule Take 150 mg by mouth daily with breakfast.    [provider]    Past Medical History: Past Medical History:  Diagnosis Date  . Depression   . Generalized anxiety disorder   . Hypertension     Past Surgical History: History reviewed. No pertinent surgical history.  Family History: No family history on file.  Social History: Social History   Socioeconomic History  . Marital status: Single    Spouse name: Not on file  . Number of children: Not on file  . Years of education: Not on file  . Highest education level: Not on file  Occupational History  . Not on file  Social Needs  . Financial resource strain: Not on file  . Food insecurity:    Worry: Not on file    Inability: Not on file  . Transportation needs:    Medical: Not on file    Non-medical: Not on file  Tobacco Use  . Smoking status: Never Smoker  Substance and Sexual  Activity  . Alcohol use: Yes    Alcohol/week: 84.0 standard drinks    Types: 84 Cans of beer per week    Comment: "about a 12 pack of beer everyday"  . Drug use: No  . Sexual activity: Not on file  Lifestyle  . Physical activity:    Days per week: Not on file    Minutes per session: Not on file  . Stress: Not on file  Relationships  . Social connections:    Talks on phone: Not on file    Gets together: Not on file    Attends religious service: Not on file    Active member of club or organization: Not on file    Attends meetings of clubs or organizations: Not on file    Relationship status: Not on file  Other Topics Concern  .  Not on file  Social History Narrative  . Not on file   Allergies:  No Known Allergies  Objective:    Vital Signs:   Temp:  [92.1 F (33.4 C)-101.5 F (38.6 C)] 101.5 F (38.6 C) (02/20 0900) Pulse Rate:  [49-85] 85 (02/20 0900) Resp:  [6-23] 22 (02/20 0900) BP: (73-135)/(58-105) 121/95 (02/20 0900) SpO2:  [91 %-100 %] 91 % (02/20 0900) FiO2 (%):  [40 %-100 %] 40 % (02/20 0834) Weight:  [72 kg] 72 kg (02/19 0923) Last BM Date: (pta)  Weight change: Filed Weights   04/01/18 0923  Weight: 72 kg    Intake/Output:   Intake/Output Summary (Last 24 hours) at 04/02/2018 0921 Last data filed at 04/02/2018 0700 Gross per 24 hour  Intake 2348.92 ml  Output 1770 ml  Net 578.92 ml      Physical Exam    General: Intubated. Not responsive off sedation.  HEENT: + ETT Neck: Supple. JVP ~8. Carotids 2+ bilat; no bruits. No thyromegaly or nodule noted. Cor: PMI nondisplaced. RRR, No M/G/R noted Lungs: Diminished basilar sounds Abdomen: Soft, non-tender, non-distended, no HSM. No bruits or masses. +BS  Extremities: No cyanosis, clubbing, or rash. Trace ankle edema. Feet cold to the touch.  Neuro: Intubated. Not responsive off sedation. Was awake earlier and tracked RN with his eyes, but not responsive on my exam.  Telemetry   NSR 80s, personally reviewed  EKG   NSR 82 bpm, QRS 120s,  personally reviewed.   Labs   Basic Metabolic Panel: Recent Labs  Lab 04/01/18 0929 04/01/18 1026 04/01/18 1207 04/01/18 2221 04/02/18 0308 04/02/18 0400 04/02/18 0744  NA 144 144  --  145 143 143  --   K 3.3* 3.4*  --  5.0 6.1* 6.2* 6.1*  CL 112*  --   --  112* 112*  --   --   CO2 25  --   --  24 20*  --   --   GLUCOSE 83  --   --  103* 93  --   --   BUN 24*  --   --  35* 37*  --   --   CREATININE 2.12*  --   --  3.13* 3.71*  --   --   CALCIUM 8.3*  --   --  8.6* 8.5*  --   --   MG  --   --  2.1 2.7* 2.9*  --   --   PHOS  --   --   --   --  6.0*  --   --     Liver Function  Tests: Recent Labs  Lab 04/01/18 0929  AST 15  ALT 12  ALKPHOS 49  BILITOT 0.7  PROT 5.6*  ALBUMIN 3.1*   No results for input(s): LIPASE, AMYLASE in the last 168 hours. No results for input(s): AMMONIA in the last 168 hours.  CBC: Recent Labs  Lab 04/01/18 0929 04/01/18 1026 04/02/18 0400  WBC 7.6  --   --   HGB 10.2* 10.2* 12.2*  HCT 31.3* 30.0* 36.0*  MCV 100.0  --   --   PLT 131*  --   --     Cardiac Enzymes: Recent Labs  Lab 04/01/18 1623 04/01/18 2103  TROPONINI <0.03 <0.03    BNP: BNP (last 3 results) No results for input(s): BNP in the last 8760 hours.  ProBNP (last 3 results) No results for input(s): PROBNP in the last 8760 hours.   CBG: Recent Labs  Lab 04/02/18 0147 04/02/18 0241 04/02/18 0423 04/02/18 0455 04/02/18 0759  GLUCAP 104* 87 58* 126* 177*    Coagulation Studies: No results for input(s): LABPROT, INR in the last 72 hours.   Imaging   Ct Head Wo Contrast  Result Date: 04/01/2018 CLINICAL DATA:  Post CPR EXAM: CT HEAD WITHOUT CONTRAST TECHNIQUE: Contiguous axial images were obtained from the base of the skull through the vertex without intravenous contrast. COMPARISON:  08/10/2015 FINDINGS: Brain: No evidence of acute infarction, hemorrhage, hydrocephalus, extra-axial collection or mass lesion/mass effect. Vascular: No hyperdense vessel or unexpected calcification. Skull: Normal. Negative for fracture or focal lesion. Sinuses/Orbits: No acute finding. Other: None. IMPRESSION: No acute intracranial pathology. Electronically Signed   By: Eddie Candle M.D.   On: 04/01/2018 10:51   Dg Chest Port 1 View  Result Date: 04/02/2018 CLINICAL DATA:  Intubated. Acute respiratory failure. EXAM: PORTABLE CHEST 1 VIEW COMPARISON:  04/01/2018. FINDINGS: The endotracheal tube remains in satisfactory position. Nasogastric tube extending into the stomach with its side hole in the proximal stomach. Stable borderline enlarged cardiac silhouette and  clear lungs. Unremarkable bones. IMPRESSION: Stable borderline cardiomegaly. No acute abnormalities. Electronically Signed   By: Claudie Revering M.D.   On: 04/02/2018 09:11   Dg Chest Portable 1 View  Result Date: 04/01/2018 CLINICAL DATA:  Status post intubation. EXAM: PORTABLE CHEST 1 VIEW COMPARISON:  None. FINDINGS: The heart size and mediastinal contours are within normal limits. Both lungs are clear. Endotracheal tube is seen projected over tracheal air shadow with distal tip 5 cm above the carina. Distal tip of nasogastric tube is seen in proximal stomach. The visualized skeletal structures are unremarkable. IMPRESSION: Endotracheal tube in grossly good position. Distal tip of nasogastric tube in proximal stomach. No acute cardiopulmonary abnormality seen. Electronically Signed   By: Marijo Conception, M.D.   On: 04/01/2018 10:20      Medications:     Current Medications: . albuterol      . chlorhexidine gluconate (MEDLINE KIT)  15 mL Mouth Rinse BID  . fentaNYL (SUBLIMAZE) injection  50 mcg Intravenous Once  . heparin  5,000 Units Subcutaneous Q8H  . mouth rinse  15 mL Mouth Rinse 10 times per day  . sodium bicarbonate  50 mEq Intravenous Once  . sodium polystyrene  30 g Per Tube STAT     Infusions: . sodium chloride    . sodium chloride    . cefTRIAXone (ROCEPHIN)  IV Stopped (04/01/18 2312)  . dextrose 10 mL/hr at 04/02/18 0557  . fentaNYL infusion INTRAVENOUS    . levETIRAcetam 500 mg (04/02/18 0751)  . norepinephrine (  LEVOPHED) Adult infusion       Patient Profile   David Dennis is a 53 y.o. male with h/o depression and anxiety.   Found down 2/19, unresponsive, and pulseless. Had 3 minutes of CPR prior to ROCS then intubated in ED. ? Overdose of metoprolol, clonazepam, and olanzapine. Though mixed history of "stopping of his meds cold [place]" in Nov 2019.  Assessment/Plan   1. ? PEA arrest -> Acute systolic CHF  - Suspected in the setting of drug  overdose. - Echo 04/01/18 LVEF 20-25%, RV moderately down, Mild LAE, Degenerative MV, MR mild by doppler.  - No previous Echos - ROSC without drugs or defib, so ? Etiology.  - Was transiently on levophed.  - No BB for now with acute decompensation - No spiro with worsening AKI and HyperK - No losartan with AKI and Hyper K - Can consider hydralazine if BP rises.  - May need R/LHC if stabilizes. Could consider cMRI, but currently not candidate with ARF.   2. Acute respiratory failure - In setting of cardiac arrest.  - Remains intubated. On droplet precautions  3. Depression/Anxiety - Likely contributed to drug overdose.   4. ARF - In setting of arrest.  - Follow. Cr up to 3.71. 10 cc of UOP this am so far.  - Will need to get renal involved.   5. Acute metabolic encephalopathy - In setting of overdose, medications, and AKI  6. Hyperkalemia - Treated with bicarb, dextrose, insulin, calcium 2/20 - Cr rising. May need HD. Oliguric this am, with only 10cc of UOP in past several hours.   7. ETOH abuse - Reportedly drinks 12 pk per day. Likely contributor to his CMP  8. ID - Tmax 101.5 - On rocephin for ? CAP.  - BloodCx pending.  - Degenerative MV on echo. ? Endocarditis.   9. H/o seizures - On Keppra. Per primary.   10. DNR - Per mother, pt has been a DNR for years with various other issues.   Medication concerns reviewed with patient and pharmacy team. Barriers identified: Compliance. Substance abuse.   Length of Stay: 1  Annamaria Helling  04/02/2018, 9:21 AM  Advanced Heart Failure Team Pager [phone] (M-F; 7a - 4p)  Please contact [facility] for night-coverage after hours (4p -7a ) and weekends on [email or web address]  Patient seen with PA.   Asked to see patient by CCM for newly noted cardiomyopathy in setting of presumed overdose on Klonopin, olanzepine, and metoprolol.  Found unresponsive by mother, PEA arrest with 3 minutes CPR, unknown how long he was  done.   Now intubated, was initially on norepinephrine, now off.  Creatinine up to 4.6 this afternoon.  Echo was done with EF 20-25%, no prior cardiac history.  Neurological status unknown.  Profound hypotension for an unknown period with end organ damage.   Suspect cardiomyopathy may be due to myocardial stunning with arrest/hypotension. This may improve over time.  No evidence for acute MI by ECG.  Not cath candidate with marked AKI.  We will follow at a distance for now.  If he has neurological recovery and renal function improves, we will help manage his cardiomyopathy in the future.   Loralie Champagne 04/02/2018  1:17 PM

## 2018-04-02 NOTE — Progress Notes (Signed)
eLink Physician-Brief Progress Note Patient Name: ALEKI HAFEN III DOB: 1965-06-04 MRN: [id number]   Date of Service  04/02/2018  HPI/Events of Note  K 6.2, elevated AKI.   eICU Interventions  Hyperkalemia protocol ordered. Insulin 5 units. On D10. d50 once. Bicarb 50 once. Follow K level.      Intervention Category Intermediate Interventions: Electrolyte abnormality - evaluation and management  [name] 04/02/2018, 6:52 AM

## 2018-04-02 NOTE — Progress Notes (Signed)
Bedside EEG completed, results pending. 

## 2018-04-02 NOTE — Progress Notes (Signed)
eLink Physician-Brief Progress Note Patient Name: David Dennis DOB: 03/27/1965 MRN: [id number]   Date of Service  04/02/2018  HPI/Events of Note  Recurring hypoglycemia. HR 80's. UOP low. Fever 101. Asking for tylenol. Lab wants to re draw am labs, may be error. K > 6. ABG looks stable. AGMA with resp acidosis   eICU Interventions  -tylenol via NG - D10 at 10 ml Decreased d5ns 50 - glucagon once.      Intervention Category Intermediate Interventions: Other:  [name] 04/02/2018, 5:14 AM

## 2018-04-02 NOTE — Consult Note (Signed)
[facility] Nephrology Consultation Note  Requesting MD: Dr Everardo All (PCCM) Reason for consult: AKI  HPI:  David Dennis is a 53 y.o. male with history of hypertension, anxiety depression, seizure disorder brought in by EMS after being found down unresponsive.  EMS found PEA arrest therefore did CPR for about 30 minutes with ROSC.  Apparently there were empty bottles in the room including metoprolol, clonazepam and olanzapine.  In the ER patient was intubated for airway protection, remains unresponsive.  The serum creatinine level was 2.12 on admission yesterday and continue to increase to 5.28 today.  He had normal serum creatinine level of 0.84 in 07/2015. The urine output declined to 25 cc so far today.  He had recorded 7170 cc yesterday.  He has Foley catheter, bladder scan showed only 30 cc of urine per nursing staff.  The peak to serum potassium was 6.2 for which  he received bicarb, insulin and dextrose.  We are consulted for the management of anuric AKI. Home medication listed as amlodipine, metoprolol and multiple psychiatric medication.  Patient required Levophed for a brief period of time.  The echocardiogram showed EF of 20 to 25% and was evaluated by cardiologist. He is currently intubated therefore unable to obtain review of system. Patient received a liter of LR this afternoon.   Creatinine, Ser  Date/Time Value Ref Range Status  04/02/2018 03:44 PM 5.28 (H) 0.61 - 1.24 mg/dL Final  16/11/9602 54:09 AM 4.65 (H) 0.61 - 1.24 mg/dL Final  81/19/1478 29:56 AM 3.71 (H) 0.61 - 1.24 mg/dL Final  21/30/8657 84:69 PM 3.13 (H) 0.61 - 1.24 mg/dL Final    Comment:    DELTA CHECK NOTED  04/01/2018 09:29 AM 2.12 (H) 0.61 - 1.24 mg/dL Final  62/95/2841 32:44 AM 0.84 0.61 - 1.24 mg/dL Final     PMHx:   Past Medical History:  Diagnosis Date  . Depression   . Generalized anxiety disorder   . Hypertension     History reviewed. No pertinent surgical  history.  Family Hx: No family history on file.  Social History:  reports that he has never smoked. He does not have any smokeless tobacco history on file. He reports current alcohol use of about 84.0 standard drinks of alcohol per week. He reports that he does not use drugs.  Allergies: No Known Allergies  Medications: Prior to Admission medications   Medication Sig Start Date End Date Taking? Authorizing Provider  amLODipine (NORVASC) 10 MG tablet Take 10 mg by mouth daily.    [provider]  chlordiazePOXIDE (LIBRIUM) 25 MG capsule 50mg  PO TID x 1D, then 25-50mg  PO BID X 1D, then 25-50mg  PO QD X 1D Patient not taking: Reported on 04/01/2018 08/10/15   Melene Plan, DO  clonazePAM (KLONOPIN) 1 MG tablet Take 0.5 mg by mouth 2 (two) times daily.    [provider]  levETIRAcetam (KEPPRA) 500 MG tablet Take 1 tablet (500 mg total) by mouth 2 (two) times daily. Patient not taking: Reported on 04/01/2018 08/10/15   Melene Plan, DO  metoprolol (LOPRESSOR) 50 MG tablet Take 50 mg by mouth 2 (two) times daily.    [provider]  Multiple Vitamins-Minerals (MULTIVITAL PO) Take 1 tablet by mouth daily.    [provider]  OLANZapine (ZYPREXA) 20 MG tablet Take 20 mg by mouth at bedtime.    [provider]  venlafaxine XR (EFFEXOR-XR) 150 MG 24 hr capsule Take 150 mg by mouth daily with breakfast.  [provider]    I have reviewed the patient's current medications.  Labs:  Results for orders placed or performed during the hospital encounter of 04/01/18 (from the past 48 hour(s))  Basic metabolic panel     Status: Abnormal   Collection Time: 04/01/18  9:29 AM  Result Value Ref Range   Sodium 144 135 - 145 mmol/L   Potassium 3.3 (L) 3.5 - 5.1 mmol/L   Chloride 112 (H) 98 - 111 mmol/L   CO2 25 22 - 32 mmol/L   Glucose, Bld 83 70 - 99 mg/dL   BUN 24 (H) 6 - 20 mg/dL   Creatinine, Ser 1.612.12 (H) 0.61 - 1.24 mg/dL   Calcium 8.3 (L) 8.9 - 10.3  mg/dL   GFR calc non Af Amer 35 (L) >60 mL/min   GFR calc Af Amer 41 (L) >60 mL/min   Anion gap 7 5 - 15    Comment: Performed at [facility], [address] [place], [place] [address]  CBC     Status: Abnormal   Collection Time: 04/01/18  9:29 AM  Result Value Ref Range   WBC 7.6 4.0 - 10.5 K/uL   RBC 3.13 (L) 4.22 - 5.81 MIL/uL   Hemoglobin 10.2 (L) 13.0 - 17.0 g/dL   HCT 54.031.3 (L) 98.139.0 - 19.152.0 %   MCV 100.0 80.0 - 100.0 fL   MCH 32.6 26.0 - 34.0 pg   MCHC 32.6 30.0 - 36.0 g/dL   RDW 47.813.3 29.511.5 - 62.115.5 %   Platelets 131 (L) 150 - 400 K/uL   nRBC 0.0 0.0 - 0.2 %    Comment: Performed at [facility], [address] [place], [place] [address]  Hepatic function panel     Status: Abnormal   Collection Time: 04/01/18  9:29 AM  Result Value Ref Range   Total Protein 5.6 (L) 6.5 - 8.1 g/dL   Albumin 3.1 (L) 3.5 - 5.0 g/dL   AST 15 15 - 41 U/L   ALT 12 0 - 44 U/L   Alkaline Phosphatase 49 38 - 126 U/L   Total Bilirubin 0.7 0.3 - 1.2 mg/dL   Bilirubin, Direct 0.2 0.0 - 0.2 mg/dL   Indirect Bilirubin 0.5 0.3 - 0.9 mg/dL    Comment: Performed at [facility], [address] [place], [place] [address]  Acetaminophen level     Status: Abnormal   Collection Time: 04/01/18  9:34 AM  Result Value Ref Range   Acetaminophen (Tylenol), Serum <10 (L) 10 - 30 ug/mL    Comment: (NOTE) Therapeutic concentrations vary significantly. A range of 10-30 ug/mL  may be an effective concentration for many patients. However, some  are best treated at concentrations outside of this range. Acetaminophen concentrations >150 ug/mL at 4 hours after ingestion  and >50 ug/mL at 12 hours after ingestion are often associated with  toxic reactions. Performed at [facility], [address] [place], [place] [address]   Salicylate level     Status: None   Collection Time: 04/01/18  9:34 AM  Result Value Ref Range   Salicylate Lvl <7.0 2.8 - 30.0 mg/dL    Comment: Performed at [facility], [address] [place], [place] [address]  Ethanol     Status: None   Collection Time: 04/01/18  9:34 AM  Result Value Ref Range   Alcohol, Ethyl (B) <10 <10 mg/dL    Comment: (NOTE) Lowest detectable limit  for serum alcohol is 10 mg/dL. For medical purposes only. Performed at [facility], [address] [place], [place] [address]   I-stat troponin, ED     Status: None   Collection Time: 04/01/18  9:53 AM  Result Value Ref Range   Troponin i, poc 0.00 0.00 - 0.08 ng/mL   Comment 3            Comment: Due to the release kinetics of cTnI, a negative result within the first hours of the onset of symptoms does not rule out myocardial infarction with certainty. If myocardial infarction is still suspected, repeat the test at appropriate intervals.   I-STAT 7, (LYTES, BLD GAS, ICA, H+H)     Status: Abnormal   Collection Time: 04/01/18 10:26 AM  Result Value Ref Range   pH, Arterial 7.456 (H) 7.350 - 7.450   pCO2 arterial 37.8 32.0 - 48.0 mmHg   pO2, Arterial 390.0 (H) 83.0 - 108.0 mmHg   Bicarbonate 26.6 20.0 - 28.0 mmol/L   TCO2 28 22 - 32 mmol/L   O2 Saturation 100.0 %   Acid-Base Excess 3.0 (H) 0.0 - 2.0 mmol/L   Sodium 144 135 - 145 mmol/L   Potassium 3.4 (L) 3.5 - 5.1 mmol/L   Calcium, Ion 1.18 1.15 - 1.40 mmol/L   HCT 30.0 (L) 39.0 - 52.0 %   Hemoglobin 10.2 (L) 13.0 - 17.0 g/dL   Patient temperature HIDE    Collection site RADIAL, ALLEN'S TEST ACCEPTABLE    Drawn by RT    Sample type ARTERIAL   Culture, blood (routine x 2)     Status: None (Preliminary result)   Collection Time: 04/01/18 12:00 PM  Result Value Ref Range   Specimen Description BLOOD RIGHT HAND    Special Requests      BOTTLES DRAWN AEROBIC AND ANAEROBIC Blood Culture results may not be optimal due to an inadequate volume of blood received in culture bottles   Culture      NO GROWTH 1 DAY Performed at [facility], [address], [place], [place] [address]    Report  Status PENDING   HIV antibody (Routine Testing)     Status: None   Collection Time: 04/01/18 12:07 PM  Result Value Ref Range   HIV Screen 4th Generation wRfx Non Reactive Non Reactive    Comment: (NOTE) Performed At: [facility] [address] [place], [place] [id number] Jolene Schimke MD WG:[id number]   Culture, blood (routine x 2)     Status: None (Preliminary result)   Collection Time: 04/01/18 12:07 PM  Result Value Ref Range   Specimen Description BLOOD RIGHT FOREARM    Special Requests      BOTTLES DRAWN AEROBIC AND ANAEROBIC Blood Culture adequate volume   Culture      NO GROWTH 1 DAY Performed at [facility], [address], [place], [place] [address]    Report Status PENDING   Magnesium     Status: None   Collection Time: 04/01/18 12:07 PM  Result Value Ref Range   Magnesium 2.1 1.7 - 2.4 mg/dL    Comment: Performed at [facility], [address], [place], [place] [address]  Rapid urine drug screen (hospital performed)     Status: Abnormal   Collection Time: 04/01/18  1:01 PM  Result Value Ref Range   Opiates NONE DETECTED NONE DETECTED   Cocaine NONE DETECTED NONE DETECTED   Benzodiazepines POSITIVE (A) NONE DETECTED  Amphetamines NONE DETECTED NONE DETECTED   Tetrahydrocannabinol NONE DETECTED NONE DETECTED   Barbiturates NONE DETECTED NONE DETECTED    Comment: (NOTE) DRUG SCREEN FOR MEDICAL PURPOSES ONLY.  IF CONFIRMATION IS NEEDED FOR ANY PURPOSE, NOTIFY LAB WITHIN 5 DAYS. LOWEST DETECTABLE LIMITS FOR URINE DRUG SCREEN Drug Class                     Cutoff (ng/mL) Amphetamine and metabolites    1000 Barbiturate and metabolites    200 Benzodiazepine                 200 Tricyclics and metabolites     300 Opiates and metabolites        300 Cocaine and metabolites        300 THC                            50 Performed at [facility], [address], [place], [place] [address]   Urine culture     Status: None   Collection  Time: 04/01/18  1:01 PM  Result Value Ref Range   Specimen Description URINE, CATHETERIZED    Special Requests NONE    Culture      NO GROWTH Performed at [facility], [address] [place], [place] [address]    Report Status 04/02/2018 FINAL   Na and K (sodium & potassium), rand urine     Status: None   Collection Time: 04/01/18  1:01 PM  Result Value Ref Range   Sodium, Ur 65 mmol/L   Potassium Urine 51 mmol/L    Comment: Performed at [facility], [address] [place], [place] [address]  CBG monitoring, ED     Status: None   Collection Time: 04/01/18  4:19 PM  Result Value Ref Range   Glucose-Capillary 78 70 - 99 mg/dL  Troponin I - Now Then Q6H     Status: None   Collection Time: 04/01/18  4:23 PM  Result Value Ref Range   Troponin I <0.03 <0.03 ng/mL    Comment: Performed at [facility], [address] [place], [place] [address]  MRSA PCR Screening     Status: None   Collection Time: 04/01/18  5:36 PM  Result Value Ref Range   MRSA by PCR NEGATIVE NEGATIVE    Comment:        The GeneXpert MRSA Assay (FDA approved for NASAL specimens only), is one component of a comprehensive MRSA colonization surveillance program. It is not intended to diagnose MRSA infection nor to guide or monitor treatment for MRSA infections. Performed at [facility], [address] [place], [place] [address]   Glucose, capillary     Status: Abnormal   Collection Time: 04/01/18  8:27 PM  Result Value Ref Range   Glucose-Capillary 129 (H) 70 - 99 mg/dL  Troponin I - Now Then Q6H     Status: None   Collection Time: 04/01/18  9:03 PM  Result Value Ref Range   Troponin I <0.03 <0.03 ng/mL    Comment: Performed at [facility], [address] [place], [place] [address]  Lactate dehydrogenase     Status: Abnormal   Collection Time: 04/01/18  9:03 PM  Result Value Ref Range   LDH 218 (H) 98 - 192 U/L    Comment: Performed at [facility], [address] [place], [place] [address]  Basic metabolic panel     Status: Abnormal   Collection Time: 04/01/18 10:21 PM  Result Value Ref Range   Sodium 145 135 - 145 mmol/L   Potassium 5.0 3.5 - 5.1 mmol/L    Comment: DELTA CHECK NOTED   Chloride 112 (H) 98 - 111 mmol/L   CO2 24 22 - 32 mmol/L   Glucose, Bld 103 (H) 70 - 99 mg/dL   BUN 35 (H) 6 - 20 mg/dL   Creatinine, Ser 8.58 (H) 0.61 - 1.24 mg/dL    Comment: DELTA CHECK NOTED   Calcium 8.6 (L) 8.9 - 10.3 mg/dL   GFR calc non Af Amer 22 (L) >60 mL/min   GFR calc Af Amer 25 (L) >60 mL/min   Anion gap 9 5 - 15    Comment: Performed at [facility], [address] [place], [place] [address]  Magnesium     Status: Abnormal   Collection Time: 04/01/18 10:21 PM  Result Value Ref Range   Magnesium 2.7 (H) 1.7 - 2.4 mg/dL    Comment: Performed at [facility], [address] [place], [place] [address]  Glucose, capillary     Status: None   Collection Time: 04/01/18 11:36 PM  Result Value Ref Range   Glucose-Capillary 70 70 - 99 mg/dL  Culture, respiratory (tracheal aspirate)     Status: None (Preliminary result)   Collection Time: 04/02/18 12:07 AM  Result Value Ref Range   Specimen Description TRACHEAL ASPIRATE    Special Requests NONE    Gram Stain      RARE WBC PRESENT, PREDOMINANTLY PMN ABUNDANT GRAM POSITIVE COCCI Performed at [facility], [address], [place], [place] [address]    Culture PENDING    Report Status PENDING   Glucose, capillary     Status: Abnormal   Collection Time: 04/02/18  1:25 AM  Result Value Ref Range   Glucose-Capillary 48 (L) 70 - 99 mg/dL  Glucose, capillary     Status: Abnormal   Collection Time: 04/02/18  1:47 AM  Result Value Ref Range   Glucose-Capillary 104 (H) 70 - 99 mg/dL  Glucose, capillary     Status: None   Collection Time: 04/02/18  2:41 AM  Result Value Ref Range   Glucose-Capillary 87 70 - 99 mg/dL  Basic metabolic panel     Status: Abnormal   Collection  Time: 04/02/18  3:08 AM  Result Value Ref Range   Sodium 143 135 - 145 mmol/L   Potassium 6.1 (H) 3.5 - 5.1 mmol/L    Comment: DELTA CHECK NOTED   Chloride 112 (H) 98 - 111 mmol/L   CO2 20 (L) 22 - 32 mmol/L   Glucose, Bld 93 70 - 99 mg/dL   BUN 37 (H) 6 - 20 mg/dL   Creatinine, Ser 7.20 (H) 0.61 - 1.24 mg/dL   Calcium 8.5 (L) 8.9 - 10.3 mg/dL   GFR calc non Af Amer 18 (L) >60 mL/min   GFR calc Af Amer 21 (L) >60 mL/min   Anion gap 11 5 - 15    Comment: Performed at [facility], [address] [place], [place] [address]  Magnesium     Status: Abnormal   Collection Time: 04/02/18  3:08 AM  Result Value Ref Range   Magnesium 2.9 (H) 1.7 - 2.4 mg/dL    Comment: Performed at [facility], [address]  [place], [place] [address]  Phosphorus     Status: Abnormal   Collection Time: 04/02/18  3:08 AM  Result Value Ref Range   Phosphorus 6.0 (H) 2.5 - 4.6 mg/dL    Comment: Performed at [facility], [address], [place], [place] [address]  I-STAT 7, (LYTES, BLD GAS, ICA, H+H)     Status: Abnormal   Collection Time: 04/02/18  4:00 AM  Result Value Ref Range   pH, Arterial 7.321 (L) 7.350 - 7.450   pCO2 arterial 37.3 32.0 - 48.0 mmHg   pO2, Arterial 97.0 83.0 - 108.0 mmHg   Bicarbonate 19.0 (L) 20.0 - 28.0 mmol/L   TCO2 20 (L) 22 - 32 mmol/L   O2 Saturation 96.0 %   Acid-base deficit 6.0 (H) 0.0 - 2.0 mmol/L   Sodium 143 135 - 145 mmol/L   Potassium 6.2 (H) 3.5 - 5.1 mmol/L   Calcium, Ion 1.19 1.15 - 1.40 mmol/L   HCT 36.0 (L) 39.0 - 52.0 %   Hemoglobin 12.2 (L) 13.0 - 17.0 g/dL   Patient temperature 21.3 C    Collection site RADIAL, ALLEN'S TEST ACCEPTABLE    Drawn by RT    Sample type ARTERIAL   Glucose, capillary     Status: Abnormal   Collection Time: 04/02/18  4:23 AM  Result Value Ref Range   Glucose-Capillary 58 (L) 70 - 99 mg/dL  Glucose, capillary     Status: Abnormal   Collection Time: 04/02/18  4:55 AM  Result Value Ref Range    Glucose-Capillary 126 (H) 70 - 99 mg/dL  CBC     Status: Abnormal   Collection Time: 04/02/18  7:44 AM  Result Value Ref Range   WBC 15.3 (H) 4.0 - 10.5 K/uL   RBC 4.29 4.22 - 5.81 MIL/uL   Hemoglobin 14.2 13.0 - 17.0 g/dL    Comment: REPEATED TO VERIFY POST TRANSFUSION SPECIMEN    HCT 42.2 39.0 - 52.0 %   MCV 98.4 80.0 - 100.0 fL   MCH 33.1 26.0 - 34.0 pg   MCHC 33.6 30.0 - 36.0 g/dL   RDW 08.6 57.8 - 46.9 %   Platelets 54 (L) 150 - 400 K/uL    Comment: REPEATED TO VERIFY PLATELET COUNT CONFIRMED BY SMEAR Immature Platelet Fraction may be clinically indicated, consider ordering this additional test [id number]    nRBC 0.0 0.0 - 0.2 %    Comment: Performed at [facility], [address], [place], [place] [address]  Potassium     Status: Abnormal   Collection Time: 04/02/18  7:44 AM  Result Value Ref Range   Potassium 6.1 (H) 3.5 - 5.1 mmol/L    Comment: Performed at [facility], [address], [place], [place] [address]  Glucose, capillary     Status: Abnormal   Collection Time: 04/02/18  7:59 AM  Result Value Ref Range   Glucose-Capillary 177 (H) 70 - 99 mg/dL  Troponin I - Now Then Q6H     Status: None   Collection Time: 04/02/18  9:50 AM  Result Value Ref Range   Troponin I QUANTITY NOT SUFFICIENT, UNABLE TO PERFORM TEST <0.03 ng/mL    Comment: [place] SAN[place] [id number] Performed at [facility], [address] [place], [place] [address]   Brain natriuretic peptide     Status: Abnormal   Collection Time: 04/02/18  9:50 AM  Result Value Ref Range   B Natriuretic Peptide 257.3 (  H) 0.0 - 100.0 pg/mL    Comment: Performed at [facility], [address] [place], [place] [address]  CK     Status: Abnormal   Collection Time: 04/02/18  9:50 AM  Result Value Ref Range   Total CK 738 (H) 49 - 397 U/L    Comment: SLIGHT HEMOLYSIS Performed at [facility], [address], [place], [place] [address]   Basic metabolic panel      Status: Abnormal   Collection Time: 04/02/18 11:12 AM  Result Value Ref Range   Sodium 145 135 - 145 mmol/L   Potassium 6.0 (H) 3.5 - 5.1 mmol/L   Chloride 113 (H) 98 - 111 mmol/L   CO2 17 (L) 22 - 32 mmol/L   Glucose, Bld 145 (H) 70 - 99 mg/dL   BUN 47 (H) 6 - 20 mg/dL   Creatinine, Ser 0.98 (H) 0.61 - 1.24 mg/dL   Calcium 8.2 (L) 8.9 - 10.3 mg/dL   GFR calc non Af Amer 14 (L) >60 mL/min   GFR calc Af Amer 16 (L) >60 mL/min   Anion gap 15 5 - 15    Comment: Performed at [facility], [address] [place], [place] [address]  Lactic acid, plasma     Status: Abnormal   Collection Time: 04/02/18 11:12 AM  Result Value Ref Range   Lactic Acid, Venous 3.9 (HH) 0.5 - 1.9 mmol/L    Comment: CRITICAL RESULT CALLED TO, READ BACK BY AND VERIFIED WITHMacie Burows [phone] [id number] WILDERK Performed at [facility], [address] [place], [place] [address]   Procalcitonin     Status: None   Collection Time: 04/02/18 11:12 AM  Result Value Ref Range   Procalcitonin 14.82 ng/mL    Comment:        Interpretation: PCT >= 10 ng/mL: Important systemic inflammatory response, almost exclusively due to severe bacterial sepsis or septic shock. (NOTE)       Sepsis PCT Algorithm           Lower Respiratory Tract                                      Infection PCT Algorithm    ----------------------------     ----------------------------         PCT < 0.25 ng/mL                PCT < 0.10 ng/mL         Strongly encourage             Strongly discourage   discontinuation of antibiotics    initiation of antibiotics    ----------------------------     -----------------------------       PCT 0.25 - 0.50 ng/mL            PCT 0.10 - 0.25 ng/mL               OR       >80% decrease in PCT            Discourage initiation of                                            antibiotics      Encourage discontinuation  of antibiotics    ----------------------------      -----------------------------         PCT >= 0.50 ng/mL              PCT 0.26 - 0.50 ng/mL                AND       <80% decrease in PCT             Encourage initiation of                                             antibiotics       Encourage continuation           of antibiotics    ----------------------------     -----------------------------        PCT >= 0.50 ng/mL                  PCT > 0.50 ng/mL               AND         increase in PCT                  Strongly encourage                                      initiation of antibiotics    Strongly encourage escalation           of antibiotics                                     -----------------------------                                           PCT <= 0.25 ng/mL                                                 OR                                        > 80% decrease in PCT                                     Discontinue / Do not initiate                                             antibiotics Performed at [facility], [address] [place], [place] [address]   Troponin I -     Status: Abnormal   Collection Time: 04/02/18 11:12 AM  Result Value Ref Range   Troponin I 0.10 (HH) <0.03 ng/mL  Comment: CRITICAL RESULT CALLED TO, READ BACK BY AND VERIFIED WITHKerry Kass  [phone] [id number] WILDERK Performed at [facility], [address], [place], [place] [address]   Glucose, capillary     Status: Abnormal   Collection Time: 04/02/18 11:32 AM  Result Value Ref Range   Glucose-Capillary 113 (H) 70 - 99 mg/dL  Troponin I - Now Then Q6H     Status: Abnormal   Collection Time: 04/02/18  3:40 PM  Result Value Ref Range   Troponin I 0.14 (HH) <0.03 ng/mL    Comment: CRITICAL VALUE NOTED.  VALUE IS CONSISTENT WITH PREVIOUSLY REPORTED AND CALLED VALUE. Performed at [facility], [address], [place], [place] [address]   Lactic acid, plasma     Status: Abnormal   Collection Time: 04/02/18  3:40 PM   Result Value Ref Range   Lactic Acid, Venous 2.7 (HH) 0.5 - 1.9 mmol/L    Comment: CRITICAL RESULT CALLED TO, READ BACK BY AND VERIFIED WITH: JAMES,R RN @ [phone] 04/02/2018 LEONARD,A Performed at [facility], [address], [place], [place] [address]   Basic metabolic panel     Status: Abnormal   Collection Time: 04/02/18  3:44 PM  Result Value Ref Range   Sodium 143 135 - 145 mmol/L   Potassium 5.2 (H) 3.5 - 5.1 mmol/L   Chloride 112 (H) 98 - 111 mmol/L   CO2 17 (L) 22 - 32 mmol/L   Glucose, Bld 147 (H) 70 - 99 mg/dL   BUN 54 (H) 6 - 20 mg/dL   Creatinine, Ser 0.86 (H) 0.61 - 1.24 mg/dL   Calcium 8.2 (L) 8.9 - 10.3 mg/dL   GFR calc non Af Amer 12 (L) >60 mL/min   GFR calc Af Amer 13 (L) >60 mL/min   Anion gap 14 5 - 15    Comment: Performed at [facility], [address] [place], [place] [address]  Glucose, capillary     Status: Abnormal   Collection Time: 04/02/18  3:50 PM  Result Value Ref Range   Glucose-Capillary 129 (H) 70 - 99 mg/dL     ROS:  Pertinent items are noted in HPI.  Review of system unable to obtain because patient is currently intubated.  Physical Exam: Vitals:   04/02/18 1645 04/02/18 1700  BP:  115/87  Pulse: 73 73  Resp: 17 16  Temp: 97.7 F (36.5 C) (!) 97.5 F (36.4 C)  SpO2: 97% 97%     General exam: Intubated, has oral feeding tube, opens eyes with name. Respiratory system: Coarse breath sound bilateral, no wheezing.  Cardiovascular system: S1 & S2 heard, RRR.  No pedal edema. Gastrointestinal system: Abdomen is  soft. Normal bowel sounds heard. Central nervous system: Opens eyes with the name only. Extremities: No deformities, edema or cyanosis. Skin: No rashes, lesions or ulcers Psychiatry: Unable to evaluate. Hematologic: No petechiae or erythema.  Assessment/Plan:  #Anuric acute kidney injury likely ATN in the setting of cardiac arrest, shock with poor renal perfusion: -I will order urinalysis, ultrasound kidneys.   Order a dose of Lasix IV today and monitor urine output.  If patient continue to remain anuric by tomorrow  he will need RRT.  Serum potassium level 5.2 today after medical treatment and chest x-ray showed no pulmonary edema.  No need for urgent dialysis tonight. -Strict ins and out, monitor BMP. -avoid IV contrast or nephrotoxins.  #PEA cardiac arrest, acute systolic CHF: Suspected in  the setting of drug overdose.  Cardiology is evaluating.  Order a dose of Lasix today.  #Acute respiratory failure: On mechanical ventilation, per PCCM  #Hyperkalemia: Treated with insulin, dextrose and bicarb.  Serum potassium level 5.2 today.  Lasix should help.  Monitor lab.  Recommend to avoid LR.  #Acute febrile episode: Checking RSV, droplet precaution.  On empiric antibiotics.  Noted patient is DNR. Discussed with the ICU team. Thank you for the consult, we will continue to follow.  Dron Jaynie Collins 04/02/2018, 5:41 PM  [facility].

## 2018-04-02 NOTE — Procedures (Signed)
History: 53 yo M with suspected overdose, encephalopathy  Sedation: None  Technique: This is a 21 channel routine scalp EEG performed at the bedside with bipolar and monopolar montages arranged in accordance to the international 10/20 system of electrode placement. One channel was dedicated to EKG recording.    Background: There is a posterior dominant rhythm of 9 Hz, but in addition, there is significant intrusion of generalized irregular theta and delta range activities into the waking background. Sleep is recorded with symmetric appearaing spindles.   Photic stimulation: Physiologic driving is not performed  EEG Abnormalities: 1) Generalized irregular slow activity.   Clinical Interpretation: This EEG is consistent with a generalized non-specific cerebral dysfunction(encephalopathy). There was no seizure or seizure predisposition recorded on this study. Please note that lack of epileptiform activity on EEG does not preclude the possibility of epilepsy.   Ritta Slot, MD [facility] [phone]  If 7pm- 7am, please page neurology on call as listed in AMION.

## 2018-04-03 ENCOUNTER — Inpatient Hospital Stay (HOSPITAL_COMMUNITY): Payer: Non-veteran care

## 2018-04-03 DIAGNOSIS — T50902A Poisoning by unspecified drugs, medicaments and biological substances, intentional self-harm, initial encounter: Secondary | ICD-10-CM

## 2018-04-03 DIAGNOSIS — I469 Cardiac arrest, cause unspecified: Secondary | ICD-10-CM

## 2018-04-03 DIAGNOSIS — R740 Nonspecific elevation of levels of transaminase and lactic acid dehydrogenase [LDH]: Secondary | ICD-10-CM

## 2018-04-03 LAB — GLUCOSE, CAPILLARY
Glucose-Capillary: 101 mg/dL — ABNORMAL HIGH (ref 70–99)
Glucose-Capillary: 91 mg/dL (ref 70–99)
Glucose-Capillary: 99 mg/dL (ref 70–99)
Glucose-Capillary: 91 mg/dL (ref 70–99)
Glucose-Capillary: 80 mg/dL (ref 70–99)

## 2018-04-03 LAB — COMPREHENSIVE METABOLIC PANEL
ALT: 5392 U/L — ABNORMAL HIGH (ref 0–44)
ANION GAP: 14 (ref 5–15)
AST: 6128 U/L — ABNORMAL HIGH (ref 15–41)
Albumin: 3 g/dL — ABNORMAL LOW (ref 3.5–5.0)
Alkaline Phosphatase: 66 U/L (ref 38–126)
BUN: 75 mg/dL — ABNORMAL HIGH (ref 6–20)
CO2: 21 mmol/L — ABNORMAL LOW (ref 22–32)
Calcium: 8.3 mg/dL — ABNORMAL LOW (ref 8.9–10.3)
Chloride: 108 mmol/L (ref 98–111)
Creatinine, Ser: 6.36 mg/dL — ABNORMAL HIGH (ref 0.61–1.24)
GFR calc Af Amer: 11 mL/min — ABNORMAL LOW (ref >60)
GFR calc non Af Amer: 9 mL/min — ABNORMAL LOW (ref >60)
Glucose, Bld: 111 mg/dL — ABNORMAL HIGH (ref 70–99)
Potassium: 4 mmol/L (ref 3.5–5.1)
Sodium: 143 mmol/L (ref 135–145)
Total Bilirubin: 0.6 mg/dL (ref 0.3–1.2)
Total Protein: 5.4 g/dL — ABNORMAL LOW (ref 6.5–8.1)

## 2018-04-03 LAB — DIC (DISSEMINATED INTRAVASCULAR COAGULATION)PANEL
Fibrinogen: 460 mg/dL (ref 210–475)
INR: 1.69
Platelets: 74 10*3/uL — ABNORMAL LOW (ref 150–400)
Prothrombin Time: 19.7 seconds — ABNORMAL HIGH (ref 11.4–15.2)
Smear Review: NONE SEEN
aPTT: 37 seconds — ABNORMAL HIGH (ref 24–36)

## 2018-04-03 LAB — CBC
HCT: 36.7 % — ABNORMAL LOW (ref 39.0–52.0)
Hemoglobin: 12.1 g/dL — ABNORMAL LOW (ref 13.0–17.0)
MCH: 32 pg (ref 26.0–34.0)
MCHC: 33 g/dL (ref 30.0–36.0)
MCV: 97.1 fL (ref 80.0–100.0)
Platelets: 77 10*3/uL — ABNORMAL LOW (ref 150–400)
RBC: 3.78 MIL/uL — AB (ref 4.22–5.81)
RDW: 13.3 % (ref 11.5–15.5)
WBC: 11.3 10*3/uL — ABNORMAL HIGH (ref 4.0–10.5)
nRBC: 0 % (ref 0.0–0.2)

## 2018-04-03 LAB — PROCALCITONIN: Procalcitonin: 17.63 ng/mL

## 2018-04-03 LAB — TROPONIN I: Troponin I: 0.13 ng/mL (ref <0.03)

## 2018-04-03 LAB — POTASSIUM
POTASSIUM: 3.8 mmol/L (ref 3.5–5.1)
Potassium: 4.2 mmol/L (ref 3.5–5.1)
Potassium: 3.8 mmol/L (ref 3.5–5.1)
Potassium: 4.1 mmol/L (ref 3.5–5.1)
Potassium: 4 mmol/L (ref 3.5–5.1)

## 2018-04-03 LAB — PHOSPHORUS: PHOSPHORUS: 4.5 mg/dL (ref 2.5–4.6)

## 2018-04-03 LAB — DIC (DISSEMINATED INTRAVASCULAR COAGULATION) PANEL

## 2018-04-03 LAB — MAGNESIUM: Magnesium: 2.6 mg/dL — ABNORMAL HIGH (ref 1.7–2.4)

## 2018-04-03 LAB — LACTATE DEHYDROGENASE: LDH: 1598 U/L — ABNORMAL HIGH (ref 98–192)

## 2018-04-03 MED ORDER — ORAL CARE MOUTH RINSE
15.0000 mL | Freq: Two times a day (BID) | OROMUCOSAL | Status: DC
  Administered 2018-04-03 – 2018-04-08 (×7): 15 mL via OROMUCOSAL

## 2018-04-03 MED ORDER — ACETYLCYSTEINE LOAD VIA INFUSION
150.0000 mg/kg | Freq: Once | INTRAVENOUS | Status: AC
  Administered 2018-04-03: 11385 mg via INTRAVENOUS
  Filled 2018-04-03: qty 285

## 2018-04-03 MED ORDER — THIAMINE HCL 100 MG/ML IJ SOLN
100.0000 mg | INTRAMUSCULAR | Status: DC
  Administered 2018-04-03 – 2018-04-05 (×3): 100 mg via INTRAVENOUS
  Filled 2018-04-03 (×3): qty 2

## 2018-04-03 MED ORDER — FOLIC ACID 5 MG/ML IJ SOLN
1.0000 mg | Freq: Every day | INTRAMUSCULAR | Status: DC
  Administered 2018-04-03 – 2018-04-06 (×4): 1 mg via INTRAVENOUS
  Filled 2018-04-03 (×5): qty 0.2

## 2018-04-03 MED ORDER — DEXTROSE 5 % IV SOLN
15.0000 mg/kg/h | INTRAVENOUS | Status: DC
  Administered 2018-04-03: 15 mg/kg/h via INTRAVENOUS
  Filled 2018-04-03: qty 200

## 2018-04-03 NOTE — Evaluation (Signed)
Clinical/Bedside Swallow Evaluation Patient Details  Name: David Dennis MRN: [id number] Date of Birth: 01-Apr-1965  Today's Date: 04/03/2018 Time: SLP Start Time (ACUTE ONLY): 1615 SLP Stop Time (ACUTE ONLY): 1633 SLP Time Calculation (min) (ACUTE ONLY): 18 min  Past Medical History:  Past Medical History:  Diagnosis Date  . Depression   . Generalized anxiety disorder   . Hypertension    Past Surgical History: History reviewed. No pertinent surgical history. HPI:  Pt is a 53 year old male who presented to the ED on 2/19 after experiencing a cardiac arrest. CPR x 3 minutes with ROSC. Empty medication bottles were found in pt's room including metoprolol, clonazepam, and olanzapine. Concern for overdose. Pt was intubated 2/19-2/21. PMH significant for HTN, depression, and anxiety.    Assessment / Plan / Recommendation Clinical Impression  Pt is mildly hoarse s/p extubation this morning. He follows commands well but is confused and repetitive in questioning. He had one immdiate cough when using a liquid wash to clear solids from his oral cavity, otherwise without any overt difficulty even while consuming larger consecutive straw sips of thin liquid. Prognosis for improvement is good as additional time passes post-extubation as well. Recommend starting Dys 3 diet and thin liquids with full supervision. SLP will f/u for tolerance and advancement. SLP Visit Diagnosis: Dysphagia, unspecified (R13.10)    Aspiration Risk  Mild aspiration risk;Moderate aspiration risk    Diet Recommendation Dysphagia 3 (Mech soft);Thin liquid   Liquid Administration via: Cup;Straw Medication Administration: Whole meds with puree Supervision: Staff to assist with self feeding;Full supervision/cueing for compensatory strategies Compensations: Slow rate;Small sips/bites;Minimize environmental distractions Postural Changes: Seated upright at 90 degrees;Remain upright for at least 30 minutes after po intake     Other  Recommendations Oral Care Recommendations: Oral care BID   Follow up Recommendations 24 hour supervision/assistance      Frequency and Duration min 2x/week  2 weeks       Prognosis Prognosis for Safe Diet Advancement: Good      Swallow Study   General HPI: Pt is a 53 year old male who presented to the ED on 2/19 after experiencing a cardiac arrest. CPR x 3 minutes with ROSC. Empty medication bottles were found in pt's room including metoprolol, clonazepam, and olanzapine. Concern for overdose. Pt was intubated 2/19-2/21. PMH significant for HTN, depression, and anxiety.  Type of Study: Bedside Swallow Evaluation Previous Swallow Assessment: none in chart Diet Prior to this Study: NPO Temperature Spikes Noted: Yes(101.5) Respiratory Status: Nasal cannula History of Recent Intubation: Yes Length of Intubations (days): 2 days Date extubated: 04/03/18 Behavior/Cognition: Alert;Cooperative;Requires cueing;Other (Comment)(repetitive) Oral Cavity Assessment: Dry Oral Care Completed by SLP: No Oral Cavity - Dentition: Adequate natural dentition Vision: Functional for self-feeding Self-Feeding Abilities: Needs assist Patient Positioning: Upright in chair Baseline Vocal Quality: Hoarse(mild) Volitional Cough: Strong Volitional Swallow: Able to elicit    Oral/Motor/Sensory Function Overall Oral Motor/Sensory Function: Within functional limits   Ice Chips Ice chips: Within functional limits Presentation: Spoon   Thin Liquid Thin Liquid: Impaired Presentation: Cup;Self Fed;Straw Pharyngeal  Phase Impairments: Cough - Immediate(x1)    Nectar Thick Nectar Thick Liquid: Not tested   Honey Thick Honey Thick Liquid: Not tested   Puree Puree: Within functional limits Presentation: Self Fed;Spoon   Solid     Solid: Within functional limits Presentation: Self Primus Bravo Jamekia Gannett 04/03/2018,4:41 PM  Natalia Leatherwood, M.A. CCC-SLP Acute Scientist, clinical (histocompatibility and immunogenetics) [phone] Office [phone]

## 2018-04-03 NOTE — Procedures (Addendum)
Extubation Procedure Note  Patient Details:   Name: David Dennis DOB: 22-Jul-1965 MRN: [id number]   Airway Documentation:    Vent end date: 04/03/18 Vent end time: 1204   Evaluation  O2 sats: stable throughout Complications: No apparent complications Patient did tolerate procedure well. Bilateral Breath Sounds: Clear, Diminished   Yes   PT was extubated to a 4L [place]   PT was able to cough and clear secretions and say his name  RT to monitor    Darcel Zick, Duane Lope 04/03/2018, 12:05 PM

## 2018-04-03 NOTE — Progress Notes (Addendum)
NAME:  Levonne HubertWilliam J Parchment Dennis, MRN:  [id number], DOB:  Jan 31, 1966, LOS: 2 ADMISSION DATE:  04/01/2018, CONSULTATION DATE:  04/01/18 REFERRING MD:  Tegerler  CHIEF COMPLAINT:  Cardiac Arrest with probable overdose   Brief History   David Dennis is a 53 y.o. male with hx depression and anxiety who was admitted 2/19 after being found down, unresponsive, and pulseless.  He received 3 minutes of CPR prior to ROSC then intubated in ED.   Possible overdose of metoprolol, clonazepam, olanzapine; however, not confirmed as mother states that pt stopped taking his pills "cold [place]" in Nov 2019).   Past Medical History  HTN, depression, anxiety.  Significant Hospital Events   2/19 Admit 2/20 Extubate 2/21 Start NAC  Consults:  PCCM  Procedures:  ETT 2/19 > 2/20  Significant Diagnostic Tests:  CT head 2/19 > negative. Echo 2/19 > LVEF 20-25%, LV mildly dilated, LV diffuse hypokinesis, RV moderately reduced systolic function, RA pressure 8mmHg. Mild PR EEG 2/20 >>  Micro Data:  Blood 2/19 >  Sputum 2/19 >  UA 2/19 > negative  RVP 2/20 >  Antimicrobials:  Ceftriaxone 2/19 x1   Interim history/subjective:  Diuresed with 570cc UOP. Patient alert and awake on mechanical ventilation this morning. Extubated without complication.  Objective:  Blood pressure (!) 143/112, pulse 90, temperature 99.9 F (37.7 C), temperature source Bladder, resp. rate (!) 23, height 6\' 2"  (1.88 m), weight 75.9 kg, SpO2 95 %.    Vent Mode: CPAP;PSV FiO2 (%):  [50 %] 50 % Set Rate:  [16 bmp] 16 bmp Vt Set:  [650 mL] 650 mL PEEP:  [5 cmH20] 5 cmH20 Pressure Support:  [5 cmH20-12 cmH20] 5 cmH20 Plateau Pressure:  [14 cmH20] 14 cmH20   Intake/Output Summary (Last 24 hours) at 04/03/2018 1331 Last data filed at 04/03/2018 1200 Gross per 24 hour  Intake 2854.71 ml  Output 1010 ml  Net 1844.71 ml   Filed Weights   04/01/18 0923 04/03/18 0600  Weight: 72 kg 75.9 kg   Physical Exam: General:  Disheveled appearing male, sitting in chair, no acute distress HENT: [place], AT, OP clear, MMM Eyes: EOMI, no scleral icterus Respiratory: Clear to auscultation bilaterally.  No crackles, wheezing or rales Cardiovascular: RRR, -M/R/G, no JVD GI: BS+, soft, nontender Extremities:-Edema,-tenderness Neuro: AAO x4, CNII-XII grossly intact Skin: Intact, no rashes or bruising Psych: Tangential speech, suicidal ideations  Resolved Issues  Acute metabolic encephalopathy secondary to overdose Hyperkalemia  Assessment & Plan:    Acute Respiratory Insufficiency  -due to inability to protect the airway in the setting of above P: Tolerated SBT  Extubated today  Transaminitis -Likely ischemic due to profound hypotension in setting overdose. Tylenol negative however may benefit from acetylcysteine as unclear other potential medications patient may have taken P: Start acetylcysteine per protocol Trend LFTs Trend INR/PT/PTT Hepatitis labs ordered  New Cardiomyopathy EF 20-25% S/p Cardiac Arrest  -Etiology of cardiomyopathy unclear. Consider post arrest, ischemic, HTN, ETOH. -Peak troponin 0.13, BNP 257 -No previous echos for comparison P: Cardiology following: Not candidate for cath due to AKI Heart failure team   Fever -RVP negative. CXR clear. Likely related to cardiomyopathy/cardiac event -Afebrile today P: Follow fever curve / WBC trend  Hold abx for now  AKI  -in setting of arrest -CK neg -Diuresed 2/20 P: Trend BMP / urinary output Replace electrolytes as indicated Avoid nephrotoxic agents, ensure adequate renal perfusion  Hx ETOH -reportedly drinks 12pk per day P: CIWA protocol  Suicidal  Ideation ?Self-reported schizophrenia P: [facility] Psychiatry consult  Best Practice:  Diet: NPO. Pain/Anxiety/Delirium protocol (if indicated): Fentanyl PRN / Midazolam PRN. RASS goal 0 to -1. VAP protocol (if indicated): In place. DVT prophylaxis: SCD's / Heparin. GI  prophylaxis: None due to prolonged QTc. Glucose control: None. Mobility: Bedrset. Code Status: DNR, confirmed with pt's mother. Family Communication: Mother updated at bedside am 2/20 Disposition: ICU.  Labs   CBC: Recent Labs  Lab 04/01/18 0929 04/01/18 1026 04/02/18 0400 04/02/18 0744 04/03/18 0715  WBC 7.6  --   --  15.3* 11.3*  HGB 10.2* 10.2* 12.2* 14.2 12.1*  HCT 31.3* 30.0* 36.0* 42.2 36.7*  MCV 100.0  --   --  98.4 97.1  PLT 131*  --   --  54* 77*   Basic Metabolic Panel: Recent Labs  Lab 04/01/18 1207 04/01/18 2221 04/02/18 0308 04/02/18 0400  04/02/18 1112 04/02/18 1544 04/02/18 1859 04/02/18 2301 04/03/18 0244 04/03/18 0715 04/03/18 1009  NA  --  145 143 143  --  145 143  --   --   --  143  --   K  --  5.0 6.1* 6.2*   < > 6.0* 5.2* 4.5 4.2 3.8 4.0 4.1  CL  --  112* 112*  --   --  113* 112*  --   --   --  108  --   CO2  --  24 20*  --   --  17* 17*  --   --   --  21*  --   GLUCOSE  --  103* 93  --   --  145* 147*  --   --   --  111*  --   BUN  --  35* 37*  --   --  47* 54*  --   --   --  75*  --   CREATININE  --  3.13* 3.71*  --   --  4.65* 5.28*  --   --   --  6.36*  --   CALCIUM  --  8.6* 8.5*  --   --  8.2* 8.2*  --   --   --  8.3*  --   MG 2.1 2.7* 2.9*  --   --   --   --   --   --   --  2.6*  --   PHOS  --   --  6.0*  --   --   --   --   --   --   --  4.5  --    < > = values in this interval not displayed.   GFR: Estimated Creatinine Clearance: 14.8 mL/min (A) (by C-G formula based on SCr of 6.36 mg/dL (H)). Recent Labs  Lab 04/01/18 0929 04/02/18 0744 04/02/18 1112 04/02/18 1540 04/03/18 0715  PROCALCITON  --   --  14.82  --  17.63  WBC 7.6 15.3*  --   --  11.3*  LATICACIDVEN  --   --  3.9* 2.7*  --    Liver Function Tests: Recent Labs  Lab 04/01/18 0929 04/03/18 0715  AST 15 6,128*  ALT 12 5,392*  ALKPHOS 49 66  BILITOT 0.7 0.6  PROT 5.6* 5.4*  ALBUMIN 3.1* 3.0*   No results for input(s): LIPASE, AMYLASE in the last 168  hours. No results for input(s): AMMONIA in the last 168 hours. ABG    Component Value Date/Time   PHART 7.321 (L) 04/02/2018 0400  PCO2ART 37.3 04/02/2018 0400   PO2ART 97.0 04/02/2018 0400   HCO3 19.0 (L) 04/02/2018 0400   TCO2 20 (L) 04/02/2018 0400   ACIDBASEDEF 6.0 (H) 04/02/2018 0400   O2SAT 96.0 04/02/2018 0400    Coagulation Profile: No results for input(s): INR, PROTIME in the last 168 hours.  Cardiac Enzymes: Recent Labs  Lab 04/01/18 2103 04/02/18 0950 04/02/18 1112 04/02/18 1540 04/02/18 2301  CKTOTAL  --  738*  --   --   --   TROPONINI <0.03 QUANTITY NOT SUFFICIENT, UNABLE TO PERFORM TEST 0.10* 0.14* 0.13*   HbA1C: No results found for: HGBA1C   CBG: Recent Labs  Lab 04/02/18 2000 04/02/18 2240 04/03/18 0455 04/03/18 0736 04/03/18 1159  GLUCAP 145* 122* 101* 91 99     Critical care time:  31 minutes  The patient is critically ill with multiple organ systems failure and requires high complexity decision making for assessment and support, frequent evaluation and titration of therapies, application of advanced monitoring technologies and extensive interpretation of multiple databases.   Critical Care Time devoted to patient care services described in this note is 31 Minutes. This time reflects time of care of this signee Dr. Mechele Collin. This critical care time does not reflect procedure time, or teaching time or supervisory time of PA/NP/Med student/Med Resident etc but could involve care discussion time.  Mechele Collin, M.D. [facility] Pulmonary/Critical Care Medicine Pager: [phone] After hours pager: [phone]

## 2018-04-03 NOTE — Progress Notes (Signed)
Admit: 04/01/2018 LOS: 2  31 M Anuric AKI after cardiac arrest  Subjective:   . 27 UOP yesterday . No pressors . Awake, agitated . Afebrile, blood pressure stable . Serum creatinine continues to worsen 6.36 this morning.  BUN 75, bicarbonate 21, potassium 4.0. . Renal ultrasound 2/20 with normal-sized kidneys bilaterally, no evidence of hydronephrosis.  02/20 0701 - 02/21 0700 In: 2262.1 [I.V.:221.5; NG/GT:940.8; IV Piggyback:1099.9] Out: 570 [Urine:570]  Filed Weights   04/01/18 0923 04/03/18 0600  Weight: 72 kg 75.9 kg    Scheduled Meds: . chlorhexidine gluconate (MEDLINE KIT)  15 mL Mouth Rinse BID  . feeding supplement (PRO-STAT SUGAR FREE 64)  30 mL Per Tube BID  . fentaNYL (SUBLIMAZE) injection  50 mcg Intravenous Once  . heparin  5,000 Units Subcutaneous Q8H  . mouth rinse  15 mL Mouth Rinse 10 times per day   Continuous Infusions: . sodium chloride    . sodium chloride 10 mL/hr at 04/02/18 1900  . cefTRIAXone (ROCEPHIN)  IV 2 g (04/02/18 2315)  . dextrose 10 mL/hr at 04/02/18 1500  . feeding supplement (VITAL 1.5 CAL) 1,000 mL (04/02/18 1725)  . fentaNYL infusion INTRAVENOUS    . levETIRAcetam 500 mg (04/03/18 0815)  . norepinephrine (LEVOPHED) Adult infusion Stopped (04/02/18 1056)   PRN Meds:.acetaminophen, fentaNYL, midazolam  Current Labs: reviewed  Results for NORWIN, ALEMAN (MRN [id number]) as of 04/03/2018 09:16  Ref. Range 04/02/2018 09:50  CK Total Latest Ref Range: 49 - 397 U/L 738 (H)    Physical Exam:  Blood pressure (!) 142/103, pulse 85, temperature 99.5 F (37.5 C), resp. rate 20, height 6' 2"  (1.88 m), weight 75.9 kg, SpO2 95 %. Intubated, awake, not interactive or following commands Regular, normal S1 and S2, normal rate, no rub Coarse bilaterally No significant peripheral edema Mild abdominal distention, soft, bowel sounds present No rashes or lesions EOMI NCAT with ET tube in place  A 1. AKI, presumed ATN from cardiac arrest  and shock, nonoliguric; progressive.  No immediate indications for hemodialysis but at risk of developing within the next 24 to 48 hours 2. PEA cardiac arrest with acute systolic heart failure, AHF following.   3. Suspicion of misuse of prescription medication/overdose 4. Acute respiratory failure, VDRF  P  Continue supportive care, no indications for hemodialysis, with progressive worsening renal function would probably need HD catheter in anticipation of HD.  Probably would not need CRRT based upon current hemodynamics . Medication Issues; o Preferred narcotic agents for pain control are hydromorphone, fentanyl, and methadone. Morphine should not be used.  o Baclofen should be avoided o Avoid oral sodium phosphate and magnesium citrate based laxatives / bowel preps    Pearson Grippe MD 04/03/2018, 8:25 AM  Recent Labs  Lab 04/02/18 0308 04/02/18 0400  04/02/18 1112 04/02/18 1544 04/02/18 1859 04/02/18 2301 04/03/18 0244  NA 143 143  --  145 143  --   --   --   K 6.1* 6.2*   < > 6.0* 5.2* 4.5 4.2 3.8  CL 112*  --   --  113* 112*  --   --   --   CO2 20*  --   --  17* 17*  --   --   --   GLUCOSE 93  --   --  145* 147*  --   --   --   BUN 37*  --   --  47* 54*  --   --   --  CREATININE 3.71*  --   --  4.65* 5.28*  --   --   --   CALCIUM 8.5*  --   --  8.2* 8.2*  --   --   --   PHOS 6.0*  --   --   --   --   --   --   --    < > = values in this interval not displayed.   Recent Labs  Lab 04/01/18 0929  04/02/18 0400 04/02/18 0744 04/03/18 0715  WBC 7.6  --   --  15.3* 11.3*  HGB 10.2*   < > 12.2* 14.2 12.1*  HCT 31.3*   < > 36.0* 42.2 36.7*  MCV 100.0  --   --  98.4 97.1  PLT 131*  --   --  54* 77*   < > = values in this interval not displayed.

## 2018-04-04 DIAGNOSIS — F6381 Intermittent explosive disorder: Secondary | ICD-10-CM

## 2018-04-04 DIAGNOSIS — T1491XA Suicide attempt, initial encounter: Secondary | ICD-10-CM

## 2018-04-04 DIAGNOSIS — F332 Major depressive disorder, recurrent severe without psychotic features: Secondary | ICD-10-CM

## 2018-04-04 DIAGNOSIS — F4010 Social phobia, unspecified: Secondary | ICD-10-CM

## 2018-04-04 LAB — PROTIME-INR
INR: 1.7
Prothrombin Time: 19.7 seconds — ABNORMAL HIGH (ref 11.4–15.2)

## 2018-04-04 LAB — CBC
HCT: 30 % — ABNORMAL LOW (ref 39.0–52.0)
Hemoglobin: 10.4 g/dL — ABNORMAL LOW (ref 13.0–17.0)
MCH: 32.6 pg (ref 26.0–34.0)
MCHC: 34.7 g/dL (ref 30.0–36.0)
MCV: 94 fL (ref 80.0–100.0)
Platelets: 65 10*3/uL — ABNORMAL LOW (ref 150–400)
RBC: 3.19 MIL/uL — ABNORMAL LOW (ref 4.22–5.81)
RDW: 13.4 % (ref 11.5–15.5)
WBC: 10.8 10*3/uL — ABNORMAL HIGH (ref 4.0–10.5)
nRBC: 0.5 % — ABNORMAL HIGH (ref 0.0–0.2)

## 2018-04-04 LAB — COMPREHENSIVE METABOLIC PANEL
ALT: 3630 U/L — ABNORMAL HIGH (ref 0–44)
AST: 1759 U/L — ABNORMAL HIGH (ref 15–41)
Albumin: 2.9 g/dL — ABNORMAL LOW (ref 3.5–5.0)
Alkaline Phosphatase: 64 U/L (ref 38–126)
Anion gap: 17 — ABNORMAL HIGH (ref 5–15)
BUN: 91 mg/dL — AB (ref 6–20)
CALCIUM: 8.4 mg/dL — AB (ref 8.9–10.3)
CO2: 23 mmol/L (ref 22–32)
Chloride: 103 mmol/L (ref 98–111)
Creatinine, Ser: 7.04 mg/dL — ABNORMAL HIGH (ref 0.61–1.24)
GFR calc Af Amer: 9 mL/min — ABNORMAL LOW (ref >60)
GFR calc non Af Amer: 8 mL/min — ABNORMAL LOW (ref >60)
Glucose, Bld: 74 mg/dL (ref 70–99)
Potassium: 3.9 mmol/L (ref 3.5–5.1)
Sodium: 143 mmol/L (ref 135–145)
Total Bilirubin: 0.4 mg/dL (ref 0.3–1.2)
Total Protein: 5.5 g/dL — ABNORMAL LOW (ref 6.5–8.1)

## 2018-04-04 LAB — GLUCOSE, CAPILLARY
Glucose-Capillary: 126 mg/dL — ABNORMAL HIGH (ref 70–99)
Glucose-Capillary: 53 mg/dL — ABNORMAL LOW (ref 70–99)
Glucose-Capillary: 60 mg/dL — ABNORMAL LOW (ref 70–99)
Glucose-Capillary: 66 mg/dL — ABNORMAL LOW (ref 70–99)
Glucose-Capillary: 75 mg/dL (ref 70–99)
Glucose-Capillary: 85 mg/dL (ref 70–99)
Glucose-Capillary: 86 mg/dL (ref 70–99)
Glucose-Capillary: 92 mg/dL (ref 70–99)

## 2018-04-04 LAB — HEPATITIS B CORE ANTIBODY, IGM: Hep B C IgM: NEGATIVE

## 2018-04-04 LAB — HEPATITIS B SURFACE ANTIGEN: Hepatitis B Surface Ag: NEGATIVE

## 2018-04-04 LAB — HEPATITIS A ANTIBODY, IGM: Hep A IgM: NEGATIVE

## 2018-04-04 LAB — PROCALCITONIN: Procalcitonin: 11.89 ng/mL

## 2018-04-04 LAB — APTT: aPTT: 37 seconds — ABNORMAL HIGH (ref 24–36)

## 2018-04-04 LAB — POTASSIUM
Potassium: 3.6 mmol/L (ref 3.5–5.1)
Potassium: 3.9 mmol/L (ref 3.5–5.1)

## 2018-04-04 LAB — HEPATITIS C ANTIBODY: HCV Ab: <0.1 s/co ratio (ref 0.0–0.9)

## 2018-04-04 MED ORDER — HYDRALAZINE HCL 20 MG/ML IJ SOLN
10.0000 mg | Freq: Four times a day (QID) | INTRAMUSCULAR | Status: DC | PRN
  Administered 2018-04-04 – 2018-04-06 (×3): 10 mg via INTRAVENOUS
  Filled 2018-04-04 (×4): qty 1

## 2018-04-04 MED ORDER — QUETIAPINE FUMARATE 25 MG PO TABS
25.0000 mg | ORAL_TABLET | Freq: Every day | ORAL | Status: DC | PRN
  Administered 2018-04-05 – 2018-04-06 (×2): 25 mg via ORAL
  Filled 2018-04-04 (×3): qty 1

## 2018-04-04 MED ORDER — GABAPENTIN 100 MG PO CAPS
100.0000 mg | ORAL_CAPSULE | Freq: Two times a day (BID) | ORAL | Status: DC
  Administered 2018-04-04 – 2018-04-08 (×9): 100 mg via ORAL
  Filled 2018-04-04 (×9): qty 1

## 2018-04-04 NOTE — Evaluation (Signed)
Physical Therapy Evaluation Patient Details Name: David Dennis MRN: [id number] DOB: 08-05-1965 Today's Date: 04/04/2018   History of Present Illness  53 year old male who presented to the ED on 2/19 after experiencing a cardiac arrest. CPR x 3 minutes with ROSC. Empty medication bottles were found in pt's room including metoprolol, clonazepam, and olanzapine. Concern for overdose. Pt was intubated 2/19-2/21. PMH significant for HTN, depression, and anxiety.  Clinical Impression  Orders received for PT evaluation. Patient demonstrates deficits in functional mobility as indicated below. Will benefit from continued skilled PT to address deficits and maximize function. Will see as indicated and progress as tolerated.  OF NOTE: ambulated on room air with saturations and HR stable, noted instability with gait therefore reliance on RW at this time for upright support and balance. Will work towards progression from device.     Follow Up Recommendations Home health PT;Supervision for mobility/OOB(May progress to No needs )    Equipment Recommendations  (TBD)    Recommendations for Other Services       Precautions / Restrictions Precautions Precautions: Fall Restrictions Weight Bearing Restrictions: No      Mobility  Bed Mobility Overal bed mobility: Needs Assistance Bed Mobility: Rolling;Supine to Sit Rolling: Supervision   Supine to sit: Supervision     General bed mobility comments: supervision for safety and line management, increased time to perform, no physical assist required  Transfers Overall transfer level: Needs assistance Equipment used: Rolling walker (2 wheeled) Transfers: Sit to/from Stand Sit to Stand: Min guard         General transfer comment: min guard for safety and stability, increased time to elevate to upright with modified hand placement on RW  Ambulation/Gait Ambulation/Gait assistance: Min guard;Min assist Gait Distance (Feet): 180  Feet Assistive device: Rolling walker (2 wheeled) Gait Pattern/deviations: Step-through pattern;Drifts right/left;Decreased stride length Gait velocity: decreased Gait velocity interpretation: <1.8 ft/sec, indicate of risk for recurrent falls General Gait Details: Vcs for increased cadence and environmental awareness  Stairs            Wheelchair Mobility    Modified Rankin (Stroke Patients Only)       Balance Overall balance assessment: Mild deficits observed, not formally tested                                           Pertinent Vitals/Pain Pain Assessment: No/denies pain    Home Living Family/patient expects to be discharged to:: Private residence Living Arrangements: Parent Available Help at Discharge: Family Type of Home: House Home Access: Stairs to enter Entrance Stairs-Rails: None(garage has 6 steps with handrails) Entrance Stairs-Number of Steps: 3 Home Layout: Two level;Bed/bath upstairs Home Equipment: None      Prior Function Level of Independence: Independent               Hand Dominance   Dominant Hand: Right    Extremity/Trunk Assessment   Upper Extremity Assessment Upper Extremity Assessment: Defer to OT evaluation    Lower Extremity Assessment Lower Extremity Assessment: Generalized weakness(modest coordination deficits initially)       Communication   Communication: No difficulties  Cognition Arousal/Alertness: Awake/alert Behavior During Therapy: Flat affect Overall Cognitive Status: No family/caregiver present to determine baseline cognitive functioning  General Comments: appears [facility]      General Comments      Exercises     Assessment/Plan    PT Assessment Patient needs continued PT services  PT Problem List Decreased strength;Decreased activity tolerance;Decreased balance;Decreased mobility;Decreased safety awareness;Cardiopulmonary status limiting  activity       PT Treatment Interventions DME instruction;Gait training;Stair training;Functional mobility training;Therapeutic activities;Balance training;Therapeutic exercise;Cognitive remediation;Neuromuscular re-education;Patient/family education    PT Goals (Current goals can be found in the Care Plan section)  Acute Rehab PT Goals Patient Stated Goal: to feel better PT Goal Formulation: With patient Time For Goal Achievement: 04/18/18 Potential to Achieve Goals: Good    Frequency Min 3X/week   Barriers to discharge        Co-evaluation               AM-PAC PT "6 Clicks" Mobility  Outcome Measure Help needed turning from your back to your side while in a flat bed without using bedrails?: None Help needed moving from lying on your back to sitting on the side of a flat bed without using bedrails?: None Help needed moving to and from a bed to a chair (including a wheelchair)?: A Little Help needed standing up from a chair using your arms (e.g., wheelchair or bedside chair)?: A Little Help needed to walk in hospital room?: A Little Help needed climbing 3-5 steps with a railing? : A Little 6 Click Score: 20    End of Session Equipment Utilized During Treatment: Gait belt;Oxygen Activity Tolerance: Patient tolerated treatment well Patient left: in chair;with call bell/phone within reach;with nursing/sitter in room Nurse Communication: Mobility status PT Visit Diagnosis: Difficulty in walking, not elsewhere classified (R26.2)    Time: 8889-1694 PT Time Calculation (min) (ACUTE ONLY): 18 min   Charges:   PT Evaluation $PT Eval Moderate Complexity: 1 Mod          Charlotte Crumb, PT DPT  Board Certified Neurologic Specialist Acute Rehabilitation Services Pager [phone] Office [phone]   Fabio Asa 04/04/2018, 10:16 AM

## 2018-04-04 NOTE — Progress Notes (Signed)
Admit: 04/01/2018 LOS: 3  53 M Anuric AKI after cardiac arrest  Subjective:   . Extubated to [place] . On NAC for transaminitis; downtrending . > 0.65L UOP yesterday . Slight inc in SCr, K 3.9, CO2 23 . Confused but conversant . Renal ultrasound 2/20 with normal-sized kidneys bilaterally, no evidence of hydronephrosis.  02/21 0701 - 02/22 0700 In: 1827.1 [P.O.:240; I.V.:757.1; NG/GT:330; IV Piggyback:500] Out: 650 [Urine:650] + unmeasured voids  Filed Weights   04/01/18 0923 04/03/18 0600 04/04/18 0500  Weight: 72 kg 75.9 kg 79.2 kg    Scheduled Meds: . feeding supplement (PRO-STAT SUGAR FREE 64)  30 mL Per Tube BID  . fentaNYL (SUBLIMAZE) injection  50 mcg Intravenous Once  . folic acid  1 mg Intravenous Daily  . heparin  5,000 Units Subcutaneous Q8H  . mouth rinse  15 mL Mouth Rinse BID  . thiamine injection  100 mg Intravenous Q24H   Continuous Infusions: . sodium chloride    . sodium chloride 10 mL/hr at 04/03/18 1800  . acetylcysteine 15 mg/kg/hr (04/03/18 1800)  . cefTRIAXone (ROCEPHIN)  IV 2 g (04/03/18 2144)  . dextrose 10 mL/hr at 04/02/18 1500  . feeding supplement (VITAL 1.5 CAL) Stopped (04/03/18 1214)  . fentaNYL infusion INTRAVENOUS    . levETIRAcetam 500 mg (04/03/18 2000)  . norepinephrine (LEVOPHED) Adult infusion Stopped (04/02/18 1056)   PRN Meds:.fentaNYL, midazolam  Current Labs: reviewed  Results for David Dennis, David Dennis (MRN [id number]) as of 04/03/2018 09:16  Ref. Range 04/02/2018 09:50  CK Total Latest Ref Range: 49 - 397 U/L 738 (H)    Physical Exam:  Blood pressure (!) 147/103, pulse 84, temperature 97.8 F (36.6 C), temperature source Oral, resp. rate 19, height 6\' 2"  (1.88 m), weight 79.2 kg, SpO2 96 %. Intubated, awake, not interactive or following commands Regular, normal S1 and S2, normal rate, no rub Coarse bilaterally No significant peripheral edema Mild abdominal distention, soft, bowel sounds present No rashes or  lesions EOMI NCAT with ET tube in place  A 1. AKI, presumed ATN from cardiac arrest and shock, nonoliguric; No immediate indications and seems might recover, K, HCO3, and Vol status ok; Will cont to monitor currently.  Would not yet put in HD cath.  2. PEA cardiac arrest with acute systolic heart failure, AHF following.   3. Suspicion of misuse of prescription medication/overdose 4. Acute respiratory failure, VDRF 5. Transaminitis; massive, ?ischemic, on NAC per CCM  P  Continue supportive care, no indications for hemodialysis and at this time would cont to monitor w/o HD cath . Medication Issues; o Preferred narcotic agents for pain control are hydromorphone, fentanyl, and methadone. Morphine should not be used.  o Baclofen should be avoided o Avoid oral sodium phosphate and magnesium citrate based laxatives / bowel preps    Sabra Heck MD 04/04/2018, 7:42 AM  Recent Labs  Lab 04/02/18 0308  04/02/18 1544  04/03/18 0715  04/03/18 1936 04/04/18 0108 04/04/18 0538  NA 143   < > 143  --  143  --   --   --  143  K 6.1*   < > 5.2*   < > 4.0   < > 3.8 3.6 3.9  CL 112*   < > 112*  --  108  --   --   --  103  CO2 20*   < > 17*  --  21*  --   --   --  23  GLUCOSE 93   < >  147*  --  111*  --   --   --  74  BUN 37*   < > 54*  --  75*  --   --   --  91*  CREATININE 3.71*   < > 5.28*  --  6.36*  --   --   --  7.04*  CALCIUM 8.5*   < > 8.2*  --  8.3*  --   --   --  8.4*  PHOS 6.0*  --   --   --  4.5  --   --   --   --    < > = values in this interval not displayed.   Recent Labs  Lab 04/02/18 0744 04/03/18 0715 04/03/18 1425 04/04/18 0538  WBC 15.3* 11.3*  --  10.8*  HGB 14.2 12.1*  --  10.4*  HCT 42.2 36.7*  --  30.0*  MCV 98.4 97.1  --  94.0  PLT 54* 77* 74* 65*

## 2018-04-04 NOTE — Progress Notes (Signed)
Patient received breakfast tray. Appears to have good appetite and hydrating.

## 2018-04-04 NOTE — Progress Notes (Signed)
Hypoglycemic Event  CBG: 53  Treatment: 8 oz juice/soda  Symptoms: None  Follow-up CBG: Time:1728 CBG Result:60 Possible Reasons for Event: Unknown  Comments/MD notified: will recheck     David Dennis M Tod Abrahamsen

## 2018-04-04 NOTE — Progress Notes (Signed)
NURSING PROGRESS NOTE  David Dennis [id number] Transfer Data: 04/04/2018 1:28 PM Attending Provider: Alyson Reedy, MD TTS:[id number], Ria Clock Medical Code Status: DNR   David Dennis is a 53 y.o. male patient transferred from 2H -No acute distress noted.  -No complaints of shortness of breath.  -No complaints of chest pain.   Cardiac Monitoring: Box #1 in place. Cardiac monitor yields:normal sinus rhythm.  Blood pressure (!) 155/112, pulse 85, temperature 98.1 F (36.7 C), temperature source Oral, resp. rate 18, height 6\' 2"  (1.88 m), weight 79.2 kg, SpO2 96 %.   IV Fluids:  IV in place, occlusive dsg intact without redness, IV cath forearm right, condition patent, no redness and discontinued and left, condition patent and no redness  no IV fluids.   Allergies:  Patient has no known allergies.  Past Medical History:   has a past medical history of Depression, Generalized anxiety disorder, and Hypertension.  Past Surgical History:   has no past surgical history on file.  Social History:   reports that he has never smoked. He does not have any smokeless tobacco history on file. He reports current alcohol use of about 84.0 standard drinks of alcohol per week. He reports that he does not use drugs.  Skin: intact   Patient/Family orientated to room. Information packet given to patient/family. Admission inpatient armband information verified with patient/family to include name and date of birth and placed on patient arm. Side rails up x 2, fall assessment and education completed with patient/family. Patient/family able to verbalize understanding of risk associated with falls and verbalized understanding to call for assistance before getting out of bed. Call light within reach. Patient/family able to voice and demonstrate understanding of unit orientation instructions.    Will continue to evaluate and treat per MD orders.

## 2018-04-04 NOTE — Progress Notes (Signed)
Hypoglycemic Event  CBG: 60  Treatment: 8 oz juice/soda  Symptoms: None  Follow-up CBG: Time:1813 CBG Result:92  Possible Reasons for Event: Unknown    David Dennis

## 2018-04-04 NOTE — Consult Note (Signed)
[facility] Face-to-Face Psychiatry Consult   Reason for Consult: depression and suicide attempt by OD Referring Physician:  Dr. Molli Knock Patient Identification: David Dennis MRN:  [id number] Principal Diagnosis: Major depressive disorder, recurrent episode, severe (HCC) Diagnosis:  Principal Problem:   Major depressive disorder, recurrent episode, severe (HCC) Active Problems:   Overdose   Cardiac arrest [facility])   Social anxiety disorder   Intermittent explosive disorder   Total Time spent with patient: 1 hour  Subjective:   David Dennis is a 53 y.o. male patient admitted unresponsive after OD on multiple medications.  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 He reports prior history of Depression, social anxiety, mood disorder, seizure disorder and Alcohol use disorder-being sober since October 2019. Patient was admitted to the hospital after he was found unresponsive following suicide attempt by overdosing on multiple medications. He reports that he took at least 30 tablets of unknown doses of Clonazepam, Venlafaxine, Olanzapine and Metoprololin an attempt to kill himself. He says ''I was disappointed that I survive, it won't border me If I die.'' Patient reports neurovegetative symptoms of depression, lack of motivation, social isolation, hopelessness, feeling worthless, lack of energy, excessive worries, social anxiety, agitation about little stuffs and recurrent suicidal ideations. He states that he quit drinking alcohol but still smokes cigarette. He denies psychosis, delusions but unable to contract for safety.  Past Psychiatric History: as above  Risk to Self:  recurrent suicidal thoughts Risk to Others:  denies Prior Inpatient Therapy:  many years ago Prior Outpatient Therapy:  [place]  Past Medical History:  Past Medical History:  Diagnosis Date  . Depression   . Generalized anxiety disorder   . Hypertension    History reviewed.  No pertinent surgical history. Family History: No family history on file. Family Psychiatric  History:  Social History:  Social History   Substance and Sexual Activity  Alcohol Use Yes  . Alcohol/week: 84.0 standard drinks  . Types: 84 Cans of beer per week   Comment: "about a 12 pack of beer everyday"     Social History   Substance and Sexual Activity  Drug Use No    Social History   Socioeconomic History  . Marital status: Single    Spouse name: Not on file  . Number of children: Not on file  . Years of education: Not on file  . Highest education level: Not on file  Occupational History  . Not on file  Social Needs  . Financial resource strain: Not on file  . Food insecurity:    Worry: Not on file    Inability: Not on file  . Transportation needs:    Medical: Not on file    Non-medical: Not on file  Tobacco Use  . Smoking status: Never Smoker  Substance and Sexual Activity  . Alcohol use: Yes    Alcohol/week: 84.0 standard drinks    Types: 84 Cans of beer per week    Comment: "about a 12 pack of beer everyday"  . Drug use: No  . Sexual activity: Not on file  Lifestyle  . Physical activity:    Days per week: Not on file    Minutes per session: Not on file  . Stress: Not on file  Relationships  . Social connections:    Talks on phone: Not on file    Gets together: Not on file    Attends religious service: Not on file  Active member of club or organization: Not on file    Attends meetings of clubs or organizations: Not on file    Relationship status: Not on file  Other Topics Concern  . Not on file  Social History Narrative  . Not on file   Additional Social History:    Allergies:  No Known Allergies  Labs:  Results for orders placed or performed during the hospital encounter of 04/01/18 (from the past 48 hour(s))  Troponin I - Now Then Q6H     Status: Abnormal   Collection Time: 04/02/18  3:40 PM  Result Value Ref Range   Troponin I 0.14 (HH)  <0.03 ng/mL    Comment: CRITICAL VALUE NOTED.  VALUE IS CONSISTENT WITH PREVIOUSLY REPORTED AND CALLED VALUE. Performed at [facility], [address], [place], [place] [address]   Lactic acid, plasma     Status: Abnormal   Collection Time: 04/02/18  3:40 PM  Result Value Ref Range   Lactic Acid, Venous 2.7 (HH) 0.5 - 1.9 mmol/L    Comment: CRITICAL RESULT CALLED TO, READ BACK BY AND VERIFIED WITH: JAMES,R RN @ [phone] 04/02/2018 LEONARD,A Performed at [facility], [address], [place], [place] [address]   Basic metabolic panel     Status: Abnormal   Collection Time: 04/02/18  3:44 PM  Result Value Ref Range   Sodium 143 135 - 145 mmol/L   Potassium 5.2 (H) 3.5 - 5.1 mmol/L   Chloride 112 (H) 98 - 111 mmol/L   CO2 17 (L) 22 - 32 mmol/L   Glucose, Bld 147 (H) 70 - 99 mg/dL   BUN 54 (H) 6 - 20 mg/dL   Creatinine, Ser 7.26 (H) 0.61 - 1.24 mg/dL   Calcium 8.2 (L) 8.9 - 10.3 mg/dL   GFR calc non Af Amer 12 (L) >60 mL/min   GFR calc Af Amer 13 (L) >60 mL/min   Anion gap 14 5 - 15    Comment: Performed at [facility], [address] [place], [place] [address]  Glucose, capillary     Status: Abnormal   Collection Time: 04/02/18  3:50 PM  Result Value Ref Range   Glucose-Capillary 129 (H) 70 - 99 mg/dL  Respiratory Panel by PCR     Status: None   Collection Time: 04/02/18  5:29 PM  Result Value Ref Range   Adenovirus NOT DETECTED NOT DETECTED   Coronavirus 229E NOT DETECTED NOT DETECTED    Comment: (NOTE) The Coronavirus on the Respiratory Panel, DOES NOT test for the novel  Coronavirus (2019 nCoV)    Coronavirus HKU1 NOT DETECTED NOT DETECTED   Coronavirus NL63 NOT DETECTED NOT DETECTED   Coronavirus OC43 NOT DETECTED NOT DETECTED   Metapneumovirus NOT DETECTED NOT DETECTED   Rhinovirus / Enterovirus NOT DETECTED NOT DETECTED   Influenza A NOT DETECTED NOT DETECTED   Influenza B NOT DETECTED NOT DETECTED   Parainfluenza Virus 1 NOT DETECTED NOT DETECTED    Parainfluenza Virus 2 NOT DETECTED NOT DETECTED   Parainfluenza Virus 3 NOT DETECTED NOT DETECTED   Parainfluenza Virus 4 NOT DETECTED NOT DETECTED   Respiratory Syncytial Virus NOT DETECTED NOT DETECTED   Bordetella pertussis NOT DETECTED NOT DETECTED   Chlamydophila pneumoniae NOT DETECTED NOT DETECTED   Mycoplasma pneumoniae NOT DETECTED NOT DETECTED    Comment: Performed at [facility], [address] [place], [place] [address]  Potassium     Status: None  Collection Time: 04/02/18  6:59 PM  Result Value Ref Range   Potassium 4.5 3.5 - 5.1 mmol/L    Comment: Performed at [facility], [address] [place], [place] [address]  Glucose, capillary     Status: Abnormal   Collection Time: 04/02/18  8:00 PM  Result Value Ref Range   Glucose-Capillary 145 (H) 70 - 99 mg/dL  Urinalysis, Routine w reflex microscopic     Status: Abnormal   Collection Time: 04/02/18  8:54 PM  Result Value Ref Range   Color, Urine YELLOW YELLOW   APPearance CLOUDY (A) CLEAR   Specific Gravity, Urine 1.013 1.005 - 1.030   pH 5.0 5.0 - 8.0   Glucose, UA NEGATIVE NEGATIVE mg/dL   Hgb urine dipstick SMALL (A) NEGATIVE   Bilirubin Urine NEGATIVE NEGATIVE   Ketones, ur NEGATIVE NEGATIVE mg/dL   Protein, ur 30 (A) NEGATIVE mg/dL   Nitrite NEGATIVE NEGATIVE   Leukocytes,Ua TRACE (A) NEGATIVE   RBC / HPF 21-50 0 - 5 RBC/hpf   WBC, UA 21-50 0 - 5 WBC/hpf   Bacteria, UA MANY (A) NONE SEEN   Squamous Epithelial / LPF 0-5 0 - 5   Mucus PRESENT    Hyaline Casts, UA PRESENT    Non Squamous Epithelial 0-5 (A) NONE SEEN    Comment: Performed at [facility], [address] [place], [place] [address]  Na and K (sodium & potassium), rand urine     Status: None   Collection Time: 04/02/18  8:55 PM  Result Value Ref Range   Sodium, Ur 82 mmol/L   Potassium Urine 61 mmol/L    Comment: Performed at [facility], [address] [place], [place] [address]  Glucose, capillary     Status:  Abnormal   Collection Time: 04/02/18 10:40 PM  Result Value Ref Range   Glucose-Capillary 122 (H) 70 - 99 mg/dL  Potassium     Status: None   Collection Time: 04/02/18 11:01 PM  Result Value Ref Range   Potassium 4.2 3.5 - 5.1 mmol/L    Comment: Performed at [facility], [address] [place], [place] [address]  Troponin I - Now Then Q6H     Status: Abnormal   Collection Time: 04/02/18 11:01 PM  Result Value Ref Range   Troponin I 0.13 (HH) <0.03 ng/mL    Comment: CRITICAL VALUE NOTED.  VALUE IS CONSISTENT WITH PREVIOUSLY REPORTED AND CALLED VALUE. Performed at [facility], [address] [place], [place] [address]   Potassium     Status: None   Collection Time: 04/03/18  2:44 AM  Result Value Ref Range   Potassium 3.8 3.5 - 5.1 mmol/L    Comment: Performed at [facility], [address] [place], [place] [address]  Glucose, capillary     Status: Abnormal   Collection Time: 04/03/18  4:55 AM  Result Value Ref Range   Glucose-Capillary 101 (H) 70 - 99 mg/dL  Comprehensive metabolic panel     Status: Abnormal   Collection Time: 04/03/18  7:15 AM  Result Value Ref Range   Sodium 143 135 - 145 mmol/L   Potassium 4.0 3.5 - 5.1 mmol/L   Chloride 108 98 - 111 mmol/L   CO2 21 (L) 22 - 32 mmol/L   Glucose, Bld 111 (H) 70 - 99 mg/dL   BUN 75 (H) 6 - 20 mg/dL   Creatinine, Ser 2.846.36 (H) 0.61 - 1.24  mg/dL   Calcium 8.3 (L) 8.9 - 10.3 mg/dL   Total Protein 5.4 (L) 6.5 - 8.1 g/dL   Albumin 3.0 (L) 3.5 - 5.0 g/dL   AST 4,098 (H) 15 - 41 U/L    Comment: RESULTS CONFIRMED BY MANUAL DILUTION   ALT 5,392 (H) 0 - 44 U/L    Comment: RESULTS CONFIRMED BY MANUAL DILUTION   Alkaline Phosphatase 66 38 - 126 U/L   Total Bilirubin 0.6 0.3 - 1.2 mg/dL   GFR calc non Af Amer 9 (L) >60 mL/min   GFR calc Af Amer 11 (L) >60 mL/min   Anion gap 14 5 - 15    Comment: Performed at [facility], [address] [place], [place] [address]  CBC     Status: Abnormal   Collection  Time: 04/03/18  7:15 AM  Result Value Ref Range   WBC 11.3 (H) 4.0 - 10.5 K/uL   RBC 3.78 (L) 4.22 - 5.81 MIL/uL   Hemoglobin 12.1 (L) 13.0 - 17.0 g/dL   HCT 78.2 (L) 95.6 - 21.3 %   MCV 97.1 80.0 - 100.0 fL   MCH 32.0 26.0 - 34.0 pg   MCHC 33.0 30.0 - 36.0 g/dL   RDW 08.6 57.8 - 46.9 %   Platelets 77 (L) 150 - 400 K/uL    Comment: REPEATED TO VERIFY SPECIMEN CHECKED FOR CLOTS Immature Platelet Fraction may be clinically indicated, consider ordering this additional test [id number] CONSISTENT WITH PREVIOUS RESULT    nRBC 0.0 0.0 - 0.2 %    Comment: Performed at [facility], [address], [place], [place] [address]  Magnesium     Status: Abnormal   Collection Time: 04/03/18  7:15 AM  Result Value Ref Range   Magnesium 2.6 (H) 1.7 - 2.4 mg/dL    Comment: Performed at [facility], [address], [place], [place] [address]  Phosphorus     Status: None   Collection Time: 04/03/18  7:15 AM  Result Value Ref Range   Phosphorus 4.5 2.5 - 4.6 mg/dL    Comment: Performed at [facility], [address], [place], [place] [address]  Procalcitonin     Status: None   Collection Time: 04/03/18  7:15 AM  Result Value Ref Range   Procalcitonin 17.63 ng/mL    Comment:        Interpretation: PCT >= 10 ng/mL: Important systemic inflammatory response, almost exclusively due to severe bacterial sepsis or septic shock. (NOTE)       Sepsis PCT Algorithm           Lower Respiratory Tract                                      Infection PCT Algorithm    ----------------------------     ----------------------------         PCT < 0.25 ng/mL                PCT < 0.10 ng/mL         Strongly encourage             Strongly discourage   discontinuation of antibiotics    initiation of antibiotics    ----------------------------     -----------------------------       PCT 0.25 - 0.50 ng/mL  PCT 0.10 - 0.25 ng/mL               OR       >80% decrease in PCT             Discourage initiation of                                            antibiotics      Encourage discontinuation           of antibiotics    ----------------------------     -----------------------------         PCT >= 0.50 ng/mL              PCT 0.26 - 0.50 ng/mL                AND       <80% decrease in PCT             Encourage initiation of                                             antibiotics       Encourage continuation           of antibiotics    ----------------------------     -----------------------------        PCT >= 0.50 ng/mL                  PCT > 0.50 ng/mL               AND         increase in PCT                  Strongly encourage                                      initiation of antibiotics    Strongly encourage escalation           of antibiotics                                     -----------------------------                                           PCT <= 0.25 ng/mL                                                 OR                                        > 80% decrease in PCT  Discontinue / Do not initiate                                             antibiotics Performed at [facility], [address] [place], [place] [address]   Glucose, capillary     Status: None   Collection Time: 04/03/18  7:36 AM  Result Value Ref Range   Glucose-Capillary 91 70 - 99 mg/dL  Potassium     Status: None   Collection Time: 04/03/18 10:09 AM  Result Value Ref Range   Potassium 4.1 3.5 - 5.1 mmol/L    Comment: Performed at [facility], [address], [place], [place] [address]  Glucose, capillary     Status: None   Collection Time: 04/03/18 11:59 AM  Result Value Ref Range   Glucose-Capillary 99 70 - 99 mg/dL  Potassium     Status: None   Collection Time: 04/03/18  2:25 PM  Result Value Ref Range   Potassium 4.0 3.5 - 5.1 mmol/L    Comment: Performed at [facility], [address] [place], [place]  [address]  DIC (disseminated intravasc coag) panel     Status: Abnormal   Collection Time: 04/03/18  2:25 PM  Result Value Ref Range   Prothrombin Time 19.7 (H) 11.4 - 15.2 seconds   INR 1.69    aPTT 37 (H) 24 - 36 seconds    Comment:        IF BASELINE aPTT IS ELEVATED, SUGGEST PATIENT RISK ASSESSMENT BE USED TO DETERMINE APPROPRIATE ANTICOAGULANT THERAPY.    Fibrinogen 460 210 - 475 mg/dL   D-Dimer, Quant >91.47 (H) 0.00 - 0.50 ug/mL-FEU    Comment: REPEATED TO VERIFY (NOTE) At the manufacturer cut-off of 0.50 ug/mL FEU, this assay has been documented to exclude PE with a sensitivity and negative predictive value of 97 to 99%.  At this time, this assay has not been approved by the FDA to exclude DVT/VTE. Results should be correlated with clinical presentation.    Platelets 74 (L) 150 - 400 K/uL    Comment: REPEATED TO VERIFY Immature Platelet Fraction may be clinically indicated, consider ordering this additional test [id number] CONSISTENT WITH PREVIOUS RESULT    Smear Review NO SCHISTOCYTES SEEN     Comment: Performed at [facility], [address] [place], [place] [address]  Glucose, capillary     Status: None   Collection Time: 04/03/18  3:59 PM  Result Value Ref Range   Glucose-Capillary 91 70 - 99 mg/dL  Hepatitis B surface antigen     Status: None   Collection Time: 04/03/18  4:09 PM  Result Value Ref Range   Hepatitis B Surface Ag Negative Negative    Comment: (NOTE) Performed At: [facility] [address] [place], [place] [id number] Jolene Schimke MD [id number]   Glucose, capillary     Status: None   Collection Time: 04/03/18  7:35 PM  Result Value Ref Range   Glucose-Capillary 80 70 - 99 mg/dL  Potassium     Status: None   Collection Time: 04/03/18  7:36 PM  Result Value Ref Range   Potassium 3.8 3.5 - 5.1 mmol/L    Comment: Performed at [facility], [address] [place], [place] [address]  Lactate dehydrogenase     Status:  Abnormal   Collection  Time: 04/03/18  7:36 PM  Result Value Ref Range   LDH 1,598 (H) 98 - 192 U/L    Comment: Performed at [facility], [address], [place], [place] [address]  Hepatitis A antibody, IgM     Status: None   Collection Time: 04/03/18  7:36 PM  Result Value Ref Range   Hep A IgM Negative Negative    Comment: (NOTE) Performed At: [facility] [address] [place], [place] [id number] Jolene Schimke MD [id number]   Hepatitis B core antibody, IgM     Status: None   Collection Time: 04/03/18  7:36 PM  Result Value Ref Range   Hep B C IgM Negative Negative    Comment: (NOTE) Performed At: [facility] [address] [place], [place] [id number] Jolene Schimke MD [id number]   Hepatitis C antibody     Status: None   Collection Time: 04/03/18  7:36 PM  Result Value Ref Range   HCV Ab <0.1 0.0 - 0.9 s/co ratio    Comment: (NOTE)                                  Negative:     < 0.8                             Indeterminate: 0.8 - 0.9                                  Positive:     > 0.9 The CDC recommends that a positive HCV antibody result be followed up with a HCV Nucleic Acid Amplification test ([id number]). Performed At: [facility] [address] [place], [place] [id number] Jolene Schimke MD [id number]   Glucose, capillary     Status: None   Collection Time: 04/04/18 12:09 AM  Result Value Ref Range   Glucose-Capillary 86 70 - 99 mg/dL  Potassium     Status: None   Collection Time: 04/04/18  1:08 AM  Result Value Ref Range   Potassium 3.6 3.5 - 5.1 mmol/L    Comment: Performed at [facility], [address], [place], [place] [address]  Glucose, capillary     Status: None   Collection Time: 04/04/18  4:04 AM  Result Value Ref Range   Glucose-Capillary 85 70 - 99 mg/dL  Procalcitonin     Status: None   Collection Time: 04/04/18  5:38 AM  Result Value Ref Range   Procalcitonin 11.89 ng/mL    Comment:         Interpretation: PCT >= 10 ng/mL: Important systemic inflammatory response, almost exclusively due to severe bacterial sepsis or septic shock. (NOTE)       Sepsis PCT Algorithm           Lower Respiratory Tract                                      Infection PCT Algorithm    ----------------------------     ----------------------------         PCT < 0.25 ng/mL                PCT < 0.10 ng/mL  Strongly encourage             Strongly discourage   discontinuation of antibiotics    initiation of antibiotics    ----------------------------     -----------------------------       PCT 0.25 - 0.50 ng/mL            PCT 0.10 - 0.25 ng/mL               OR       >80% decrease in PCT            Discourage initiation of                                            antibiotics      Encourage discontinuation           of antibiotics    ----------------------------     -----------------------------         PCT >= 0.50 ng/mL              PCT 0.26 - 0.50 ng/mL                AND       <80% decrease in PCT             Encourage initiation of                                             antibiotics       Encourage continuation           of antibiotics    ----------------------------     -----------------------------        PCT >= 0.50 ng/mL                  PCT > 0.50 ng/mL               AND         increase in PCT                  Strongly encourage                                      initiation of antibiotics    Strongly encourage escalation           of antibiotics                                     -----------------------------                                           PCT <= 0.25 ng/mL                                                 OR                                        >  80% decrease in PCT                                     Discontinue / Do not initiate                                             antibiotics Performed at [facility], [address], [place],  [place] [address]   Comprehensive metabolic panel     Status: Abnormal   Collection Time: 04/04/18  5:38 AM  Result Value Ref Range   Sodium 143 135 - 145 mmol/L   Potassium 3.9 3.5 - 5.1 mmol/L   Chloride 103 98 - 111 mmol/L   CO2 23 22 - 32 mmol/L   Glucose, Bld 74 70 - 99 mg/dL   BUN 91 (H) 6 - 20 mg/dL   Creatinine, Ser 6.04 (H) 0.61 - 1.24 mg/dL   Calcium 8.4 (L) 8.9 - 10.3 mg/dL   Total Protein 5.5 (L) 6.5 - 8.1 g/dL   Albumin 2.9 (L) 3.5 - 5.0 g/dL   AST 5,409 (H) 15 - 41 U/L   ALT 3,630 (H) 0 - 44 U/L    Comment: RESULTS CONFIRMED BY MANUAL DILUTION   Alkaline Phosphatase 64 38 - 126 U/L   Total Bilirubin 0.4 0.3 - 1.2 mg/dL   GFR calc non Af Amer 8 (L) >60 mL/min   GFR calc Af Amer 9 (L) >60 mL/min   Anion gap 17 (H) 5 - 15    Comment: Performed at [facility], [address], [place], [place] [address]  CBC     Status: Abnormal   Collection Time: 04/04/18  5:38 AM  Result Value Ref Range   WBC 10.8 (H) 4.0 - 10.5 K/uL   RBC 3.19 (L) 4.22 - 5.81 MIL/uL   Hemoglobin 10.4 (L) 13.0 - 17.0 g/dL   HCT 47.8 (L) 29.5 - 62.1 %   MCV 94.0 80.0 - 100.0 fL   MCH 32.6 26.0 - 34.0 pg   MCHC 34.7 30.0 - 36.0 g/dL   RDW 30.8 65.7 - 84.6 %   Platelets 65 (L) 150 - 400 K/uL    Comment: REPEATED TO VERIFY SPECIMEN CHECKED FOR CLOTS Immature Platelet Fraction may be clinically indicated, consider ordering this additional test [id number] CONSISTENT WITH PREVIOUS RESULT    nRBC 0.5 (H) 0.0 - 0.2 %    Comment: Performed at [facility], [address] [place], [place] [address]  Protime-INR     Status: Abnormal   Collection Time: 04/04/18  5:38 AM  Result Value Ref Range   Prothrombin Time 19.7 (H) 11.4 - 15.2 seconds   INR 1.70     Comment: Performed at [facility], [address], [place], [place] [address]  APTT     Status: Abnormal   Collection Time: 04/04/18  5:38 AM  Result Value Ref Range   aPTT 37 (H) 24 - 36 seconds    Comment:        IF BASELINE aPTT  IS ELEVATED, SUGGEST PATIENT RISK ASSESSMENT BE USED TO DETERMINE APPROPRIATE ANTICOAGULANT THERAPY. Performed at [facility], [address], [place], [place] [address]   Glucose, capillary     Status: Abnormal   Collection Time: 04/04/18  7:25 AM  Result Value Ref Range   Glucose-Capillary 66 (L) 70 - 99 mg/dL  Potassium     Status: None   Collection Time: 04/04/18 10:47 AM  Result Value Ref Range   Potassium 3.9 3.5 - 5.1 mmol/L    Comment: Performed at [facility], [address], [place], [place] [address]  Glucose, capillary     Status: None   Collection Time: 04/04/18 11:24 AM  Result Value Ref Range   Glucose-Capillary 75 70 - 99 mg/dL    Current Facility-Administered Medications  Medication Dose Route Frequency Provider Last Rate Last Dose  . 0.9 %  sodium chloride infusion  250 mL Intravenous Continuous Ollis, Brandi L, NP 10 mL/hr at 04/03/18 1800    . cefTRIAXone (ROCEPHIN) 2 g in sodium chloride 0.9 % 100 mL IVPB  2 g Intravenous Q24H Luciano Cutter, MD   Stopped at 04/04/18 0801  . dextrose 10 % infusion   Intravenous Continuous [name], MD 10 mL/hr at 04/02/18 1500    . folic acid injection 1 mg  1 mg Intravenous Daily Luciano Cutter, MD   1 mg at 04/04/18 0955  . heparin injection 5,000 Units  5,000 Units Subcutaneous Q8H Desai, Rahul P, PA-C   5,000 Units at 04/04/18 0548  . hydrALAZINE (APRESOLINE) injection 10 mg  10 mg Intravenous Q6H PRN Alyson Reedy, MD   10 mg at 04/04/18 1200  . levETIRAcetam (KEPPRA) IVPB 500 mg/100 mL premix  500 mg Intravenous Q12H Desai, Rahul P, PA-C 400 mL/hr at 04/04/18 0845 500 mg at 04/04/18 0845  . MEDLINE mouth rinse  15 mL Mouth Rinse BID Luciano Cutter, MD   15 mL at 04/03/18 2158  . midazolam (VERSED) injection 2 mg  2 mg Intravenous Q2H PRN Celine Mans, Rahul P, PA-C   2 mg at 04/03/18 0158  . thiamine (B-1) injection 100 mg  100 mg Intravenous Q24H Luciano Cutter, MD   100 mg at 04/03/18 2139     Musculoskeletal: Strength & Muscle Tone: within normal limits Gait & Station: unsteady Patient leans: N/A  Psychiatric Specialty Exam: Physical Exam  Psychiatric: His mood appears anxious. His speech is delayed. He is slowed and withdrawn. Cognition and memory are normal. He expresses impulsivity. He exhibits a depressed mood. He expresses suicidal ideation. He expresses suicidal plans.    Review of Systems  Constitutional: Positive for malaise/fatigue.  HENT: Negative.   Eyes: Negative.   Respiratory: Negative.   Cardiovascular: Negative.   Gastrointestinal: Positive for nausea.  Genitourinary: Negative.   Musculoskeletal: Negative.   Skin: Negative.   Neurological: Negative.   Endo/Heme/Allergies: Negative.   Psychiatric/Behavioral: Positive for depression and suicidal ideas. The patient is nervous/anxious.     Blood pressure (!) 155/112, pulse 85, temperature 98.1 F (36.7 C), temperature source Oral, resp. rate 18, height 6\' 2"  (1.88 m), weight 79.2 kg, SpO2 96 %.Body mass index is 22.42 kg/m.  General Appearance: Casual  Eye Contact:  Good  Speech:  Clear and Coherent  Volume:  Decreased  Mood:  Depressed and Hopeless  Affect:  Constricted  Thought Process:  Coherent  Orientation:  Full (Time, Place, and Person)  Thought Content:  Logical  Suicidal Thoughts:  Yes.  with intent/plan  Homicidal Thoughts:  No  Memory:  Immediate;   Fair Recent;   Fair Remote;   Fair  Judgement:  Poor  Insight:  Shallow  Psychomotor Activity:  Psychomotor Retardation  Concentration:  Concentration: Fair and Attention Span: Fair  Recall:  Good  Fund of Knowledge:  Good  Language:  Good  Akathisia:  No  Handed:  Right  AIMS (if indicated):     Assets:  Communication Skills Desire for Improvement Others:  family support  ADL's:  Intact  Cognition:  WNL  Sleep:   fair     Treatment Plan Summary: 53 y.o. male with history of Depression, social anxiety, mood disorder,  seizure disorder and Alcohol use disorder who was admitted after he attempted suicide by overdosing on multiple medications. Patient remains depressed, anxious and unable to contract for safety.  Recommendations: -Continue 1:1 sitter for safety -Consider Ativan 1 mg every 6 hrs prn for Benzodiazepine withdrawal. -Consider Seroquel 25 mg daily at bedtime as needed for sleep. -Consider Gabapentin 100 mg BID for anxiety/mood when patient is alert and awake. -Consider social worker consult to assist with placement in an inpatient psychiatric facility. -Consider placing patient on IVC if he refuses Voluntary psychiatric admission.   Disposition: Recommend psychiatric Inpatient admission when medically cleared. Supportive therapy provided about ongoing stressors. Re-consult psych as needed   Thedore Mins, MD 04/04/2018 2:26 PM

## 2018-04-04 NOTE — Progress Notes (Signed)
NAME:  David Dennis, MRN:  [id number], DOB:  1965-11-29, LOS: 3 ADMISSION DATE:  04/01/2018, CONSULTATION DATE:  04/01/18 REFERRING MD:  Tegerler  CHIEF COMPLAINT:  Cardiac Arrest with probable overdose   Brief History   David Dennis is a 53 y.o. male with hx depression and anxiety who was admitted 2/19 after being found down, unresponsive, and pulseless.  He received 3 minutes of CPR prior to ROSC then intubated in ED.   Possible overdose of metoprolol, clonazepam, olanzapine; however, not confirmed as mother states that pt stopped taking his pills "cold [place]" in Nov 2019).   Past Medical History  HTN, depression, anxiety.  Significant Hospital Events   2/19 Admit 2/20 Extubate 2/21 Start NAC  Consults:  PCCM  Procedures:  ETT 2/19 > 2/20  Significant Diagnostic Tests:  CT head 2/19 > negative. Echo 2/19 > LVEF 20-25%, LV mildly dilated, LV diffuse hypokinesis, RV moderately reduced systolic function, RA pressure . Mild PR EEG 2/20 >>  Micro Data:  Blood 2/19 >  Sputum 2/19 >  UA 2/19 > negative  RVP 2/20 >  Antimicrobials:  Ceftriaxone 2/19 x1   Interim history/subjective:  No events overnight, sitting up in a chair with mother and safety sitter, no new complaints  Objective:  Blood pressure (!) 146/92, pulse 84, temperature 97.8 F (36.6 C), temperature source Oral, resp. rate (!) 24, height  (1.88 m), weight 79.2 kg, SpO2 98 %.    Vent Mode: CPAP;PSV FiO2 (%):  [50 %] 50 % PEEP:  [5 cmH20] 5 cmH20 Pressure Support:  [5 cmH20] 5 cmH20   Intake/Output Summary (Last 24 hours) at 04/04/2018 0949 Last data filed at 04/04/2018 0827 Gross per 24 hour  Intake 1960.59 ml  Output 425 ml  Net 1535.59 ml   Filed Weights   04/01/18 0923 04/03/18 0600 04/04/18 0500  Weight: 72 kg 75.9 kg 79.2 kg   Physical Exam: General: Chronically unwell appearing male, older than stated age HENT: /AT, PERRL, EOM-I and MMM Eyes: EOMI, no scleral  icterus Respiratory: CTA bilaterally  Cardiovascular: RRR, -M/R/G, no JVD GI: BS+, soft, nontender Extremities:-Edema,-tenderness Neuro: AAO x4, CNII-XII grossly intact Skin: Intact, no rashes or bruising  I reviewed CXR myself, no acute disease noted  Resolved Issues  Acute metabolic encephalopathy secondary to overdose Hyperkalemia  Assessment & Plan:    Acute Respiratory Insufficiency  -due to inability to protect the airway in the setting of above P: Titrate O2 for sat of 88-92% Monitor for airway protection  Transaminitis -Likely ischemic due to profound hypotension in setting overdose. Tylenol negative however may benefit from acetylcysteine as unclear other potential medications patient may have taken P: NAC per poison control Trend LFTs Trend INR/PT/PTT Hepatitis labs noted  New Cardiomyopathy EF 20-25% S/p Cardiac Arrest  -Etiology of cardiomyopathy unclear. Consider post arrest, ischemic, HTN, ETOH. -Peak troponin 0.13, BNP 257 -No previous echos for comparison P: Appreciate input from cardiology Tele monitoring  Fever -RVP negative. CXR clear. Likely related to cardiomyopathy/cardiac event -Afebrile today P: Follow fever curve / WBC trend  No need for abx at this time  AKI  -in setting of arrest -CK neg -Diuresed 2/20 P: Trend BMP / urinary output Replace electrolytes as indicated Avoid nephrotoxic agents, ensure adequate renal perfusion  Hx ETOH -reportedly drinks 12pk per day P: CIWA protocol  Suicidal Ideation ?Self-reported schizophrenia P: Sitter Psychiatry consult in place No discharge til cleared by psych  Transfer to tele and to  TRH service with PCCM off 2/23  Best Practice:  Diet: NPO. Pain/Anxiety/Delirium protocol (if indicated): Fentanyl PRN / Midazolam PRN. RASS goal 0 to -1. VAP protocol (if indicated): In place. DVT prophylaxis: SCD's / Heparin. GI prophylaxis: None due to prolonged QTc. Glucose control:  None. Mobility: Bedrset. Code Status: DNR, confirmed with pt's mother. Family Communication: Mother updated at bedside am 2/20 Disposition: ICU.  Labs   CBC: Recent Labs  Lab 04/01/18 0929 04/01/18 1026 04/02/18 0400 04/02/18 0744 04/03/18 0715 04/03/18 1425 04/04/18 0538  WBC 7.6  --   --  15.3* 11.3*  --  10.8*  HGB 10.2* 10.2* 12.2* 14.2 12.1*  --  10.4*  HCT 31.3* 30.0* 36.0* 42.2 36.7*  --  30.0*  MCV 100.0  --   --  98.4 97.1  --  94.0  PLT 131*  --   --  54* 77* 74* 65*   Basic Metabolic Panel: Recent Labs  Lab 04/01/18 1207 04/01/18 2221 04/02/18 0308 04/02/18 0400  04/02/18 1112 04/02/18 1544  04/03/18 0715 04/03/18 1009 04/03/18 1425 04/03/18 1936 04/04/18 0108 04/04/18 0538  NA  --  145 143 143  --  145 143  --  143  --   --   --   --  143  K  --  5.0 6.1* 6.2*   < > 6.0* 5.2*   < > 4.0 4.1 4.0 3.8 3.6 3.9  CL  --  112* 112*  --   --  113* 112*  --  108  --   --   --   --  103  CO2  --  24 20*  --   --  17* 17*  --  21*  --   --   --   --  23  GLUCOSE  --  103* 93  --   --  145* 147*  --  111*  --   --   --   --  74  BUN  --  35* 37*  --   --  47* 54*  --  75*  --   --   --   --  91*  CREATININE  --  3.13* 3.71*  --   --  4.65* 5.28*  --  6.36*  --   --   --   --  7.04*  CALCIUM  --  8.6* 8.5*  --   --  8.2* 8.2*  --  8.3*  --   --   --   --  8.4*  MG 2.1 2.7* 2.9*  --   --   --   --   --  2.6*  --   --   --   --   --   PHOS  --   --  6.0*  --   --   --   --   --  4.5  --   --   --   --   --    < > = values in this interval not displayed.   GFR: Estimated Creatinine Clearance: 13.9 mL/min (A) (by C-G formula based on SCr of 7.04 mg/dL (H)). Recent Labs  Lab 04/01/18 0929 04/02/18 0744 04/02/18 1112 04/02/18 1540 04/03/18 0715 04/04/18 0538  PROCALCITON  --   --  14.82  --  17.63 11.89  WBC 7.6 15.3*  --   --  11.3* 10.8*  LATICACIDVEN  --   --  3.9* 2.7*  --   --  Liver Function Tests: Recent Labs  Lab 04/01/18 0929 04/03/18 0715  04/04/18 0538  AST 15 6,128* 1,759*  ALT 12 5,392* 3,630*  ALKPHOS 49 66 64  BILITOT 0.7 0.6 0.4  PROT 5.6* 5.4* 5.5*  ALBUMIN 3.1* 3.0* 2.9*   No results for input(s): LIPASE, AMYLASE in the last 168 hours. No results for input(s): AMMONIA in the last 168 hours. ABG    Component Value Date/Time   PHART 7.321 (L) 04/02/2018 0400   PCO2ART 37.3 04/02/2018 0400   PO2ART 97.0 04/02/2018 0400   HCO3 19.0 (L) 04/02/2018 0400   TCO2 20 (L) 04/02/2018 0400   ACIDBASEDEF 6.0 (H) 04/02/2018 0400   O2SAT 96.0 04/02/2018 0400    Coagulation Profile: Recent Labs  Lab 04/03/18 1425 04/04/18 0538  INR 1.69 1.70    Cardiac Enzymes: Recent Labs  Lab 04/01/18 2103 04/02/18 0950 04/02/18 1112 04/02/18 1540 04/02/18 2301  CKTOTAL  --  738*  --   --   --   TROPONINI <0.03 QUANTITY NOT SUFFICIENT, UNABLE TO PERFORM TEST 0.10* 0.14* 0.13*   HbA1C: No results found for: HGBA1C   CBG: Recent Labs  Lab 04/03/18 1559 04/03/18 1935 04/04/18 0009 04/04/18 0404 04/04/18 0725  GLUCAP 91 80 86 85 66*   Discussed with PCCM-NP and TRH-MD  Alyson Reedy, M.D. [facility] Pulmonary/Critical Care Medicine. Pager: [phone]. After hours pager: [phone].

## 2018-04-04 NOTE — Plan of Care (Signed)

## 2018-04-05 DIAGNOSIS — J9601 Acute respiratory failure with hypoxia: Secondary | ICD-10-CM

## 2018-04-05 DIAGNOSIS — F332 Major depressive disorder, recurrent severe without psychotic features: Secondary | ICD-10-CM

## 2018-04-05 LAB — GLUCOSE, CAPILLARY
Glucose-Capillary: 66 mg/dL — ABNORMAL LOW (ref 70–99)
Glucose-Capillary: 72 mg/dL (ref 70–99)
Glucose-Capillary: 73 mg/dL (ref 70–99)
Glucose-Capillary: 73 mg/dL (ref 70–99)
Glucose-Capillary: 78 mg/dL (ref 70–99)
Glucose-Capillary: 82 mg/dL (ref 70–99)
Glucose-Capillary: 88 mg/dL (ref 70–99)
Glucose-Capillary: 98 mg/dL (ref 70–99)

## 2018-04-05 LAB — CBC
HCT: 31.4 % — ABNORMAL LOW (ref 39.0–52.0)
Hemoglobin: 10.9 g/dL — ABNORMAL LOW (ref 13.0–17.0)
MCH: 32.7 pg (ref 26.0–34.0)
MCHC: 34.7 g/dL (ref 30.0–36.0)
MCV: 94.3 fL (ref 80.0–100.0)
NRBC: 1.3 % — AB (ref 0.0–0.2)
Platelets: 74 10*3/uL — ABNORMAL LOW (ref 150–400)
RBC: 3.33 MIL/uL — ABNORMAL LOW (ref 4.22–5.81)
RDW: 13.5 % (ref 11.5–15.5)
WBC: 9.2 10*3/uL (ref 4.0–10.5)

## 2018-04-05 LAB — COMPREHENSIVE METABOLIC PANEL
ALT: 2879 U/L — ABNORMAL HIGH (ref 0–44)
AST: 788 U/L — ABNORMAL HIGH (ref 15–41)
Albumin: 3.4 g/dL — ABNORMAL LOW (ref 3.5–5.0)
Alkaline Phosphatase: 98 U/L (ref 38–126)
Anion gap: 17 — ABNORMAL HIGH (ref 5–15)
BUN: 92 mg/dL — ABNORMAL HIGH (ref 6–20)
CHLORIDE: 103 mmol/L (ref 98–111)
CO2: 21 mmol/L — ABNORMAL LOW (ref 22–32)
Calcium: 8.9 mg/dL (ref 8.9–10.3)
Creatinine, Ser: 5.51 mg/dL — ABNORMAL HIGH (ref 0.61–1.24)
GFR calc Af Amer: 13 mL/min — ABNORMAL LOW (ref >60)
GFR calc non Af Amer: 11 mL/min — ABNORMAL LOW (ref >60)
Glucose, Bld: 87 mg/dL (ref 70–99)
POTASSIUM: 3.6 mmol/L (ref 3.5–5.1)
Sodium: 141 mmol/L (ref 135–145)
Total Bilirubin: 0.7 mg/dL (ref 0.3–1.2)
Total Protein: 6.5 g/dL (ref 6.5–8.1)

## 2018-04-05 LAB — MAGNESIUM: Magnesium: 2.4 mg/dL (ref 1.7–2.4)

## 2018-04-05 LAB — CULTURE, RESPIRATORY W GRAM STAIN

## 2018-04-05 LAB — PHOSPHORUS: Phosphorus: 5 mg/dL — ABNORMAL HIGH (ref 2.5–4.6)

## 2018-04-05 LAB — APTT: aPTT: 34 seconds (ref 24–36)

## 2018-04-05 LAB — POTASSIUM: Potassium: 3.5 mmol/L (ref 3.5–5.1)

## 2018-04-05 LAB — PROTIME-INR
INR: 1.46
Prothrombin Time: 17.5 seconds — ABNORMAL HIGH (ref 11.4–15.2)

## 2018-04-05 MED ORDER — CARVEDILOL 3.125 MG PO TABS
3.1250 mg | ORAL_TABLET | Freq: Two times a day (BID) | ORAL | Status: DC
  Administered 2018-04-05 – 2018-04-06 (×3): 3.125 mg via ORAL
  Filled 2018-04-05 (×3): qty 1

## 2018-04-05 MED ORDER — LORAZEPAM 1 MG PO TABS: 1.0000 mg | ORAL_TABLET | Freq: Four times a day (QID) | ORAL | Status: DC | PRN

## 2018-04-05 MED ORDER — LEVETIRACETAM 500 MG PO TABS
500.0000 mg | ORAL_TABLET | Freq: Two times a day (BID) | ORAL | Status: DC
  Administered 2018-04-05 – 2018-04-08 (×6): 500 mg via ORAL
  Filled 2018-04-05 (×6): qty 1

## 2018-04-05 NOTE — Progress Notes (Signed)
PROGRESS NOTE        PATIENT DETAILS Name: David Dennis Age: 53 y.o. Sex: male Date of Birth: 01-Mar-1965 Admit Date: 04/01/2018 Admitting Physician Chi Mechele Collin, MD ZOX:[id number], [facility]  Brief Narrative: Patient is a 53 y.o. male with history of depression, anxiety, hypertension, seizure disorder-into the emergency room unresponsive, pulseless, received 3 minutes of CPR with ROSC-admitted to the ICU intubated-further evaluation revealed AKI, shock liver and cardiomyopathy.  Now thought that this was probably secondary to a suicidal attempt with drug overdose.  See below for further details.  Subjective: Awake and alert when seen earlier this morning.  Later seen ambulating in the hallway with sitter.  Denies any chest pain, nausea, vomiting or diarrhea.  Assessment/Plan: Acute hypoxic respiratory failure: Intubated in the emergency room due to failure to protect airway--subsequently extubated- 2/20-due to continued clinical improvement, has now been titrated to room air.  AKI: Likely ATN in the setting of cardiac arrest, and downtrending-other electrolytes stable, good urine output.  Follow electrolytes closely.  Elevated transaminases secondary to shock liver: Secondary to cardiac arrest-downtrending.  Hepatitis serology negative.  PEA arrest: Thought to be in the setting of presumed drug overdose.  Cardiology/CHF team recommending supportive care, once renal function improves further-cardiology will perform work-up in the future.  Acute systolic heart failure (EF 20-25%): Thought to be due to myocardial stunning secondary to cardiac arrest.  Volume status is currently stable.  Blood pressure now stable-start low-dose Coreg.  Unable to use ACEI/ARB due to renal function.  Fever likely secondary to aspiration pneumonia: Does have a lung infiltrate in right lower lobe on chest x-ray done on 2/21-sputum cultures positive for staph aureus-blood  cultures negative.  On Rocephin-await culture results before adjusting further.  Fever curve is much better-in fact he is afebrile since yesterday.  Hypertension: Blood pressure slowly creeping up-we will start low-dose Coreg  Seizure disorder: Continue Keppra  Drug overdose/suicidal attempt: Evaluated by psychiatry-recommendations are to transfer to inpatient psych.  Depression/anxiety: Appears depressed-but this morning claims his mood is stable.  Gabapentin 100 mg twice daily, and Seroquel as needed at bedtime.  See above regarding plans to transfer to inpatient psych when he is more stable  DVT Prophylaxis: Prophylactic Heparin   Code Status: DNR  Family Communication: None at bedside  Disposition Plan: Remain inpatient-inpatient psychiatry when renal/liver function improves further.  Antimicrobial agents: Anti-infectives (From admission, onward)   Start     Dose/Rate Route Frequency Ordered Stop   04/01/18 2200  cefTRIAXone (ROCEPHIN) 2 g in sodium chloride 0.9 % 100 mL IVPB     2 g 200 mL/hr over 30 Minutes Intravenous Every 24 hours 04/01/18 1851        Procedures: None  CONSULTS:  cardiology, pulmonary/intensive care and psychiatry  Time spent: 25 minutes-Greater than 50% of this time was spent in counseling, explanation of diagnosis, planning of further management, and coordination of care.  MEDICATIONS: Scheduled Meds: . folic acid  1 mg Intravenous Daily  . gabapentin  100 mg Oral BID  . heparin  5,000 Units Subcutaneous Q8H  . mouth rinse  15 mL Mouth Rinse BID  . thiamine injection  100 mg Intravenous Q24H   Continuous Infusions: . sodium chloride 10 mL/hr at 04/03/18 1800  . cefTRIAXone (ROCEPHIN)  IV 2 g (04/04/18 2149)  . dextrose 10 mL/hr at  04/04/18 2022  . levETIRAcetam 500 mg (04/05/18 1010)   PRN Meds:.hydrALAZINE, midazolam, QUEtiapine   PHYSICAL EXAM: Vital signs: Vitals:   04/04/18 1200 04/04/18 1327 04/05/18 0453 04/05/18 0500  BP:  (!) 155/102 (!) 155/112 (!) 147/97   Pulse:   78   Resp: (!) 27 18 20    Temp:  98.1 F (36.7 C) 98 F (36.7 C)   TempSrc:  Oral Oral   SpO2:  96% (!) 88%   Weight:    73.6 kg  Height:       Filed Weights   04/03/18 0600 04/04/18 0500 04/05/18 0500  Weight: 75.9 kg 79.2 kg 73.6 kg   Body mass index is 20.84 kg/m.   General appearance :Awake, alert, not in any distress. Speech Clear. HEENT: Atraumatic and Normocephalic Neck: supple, no JVD Resp:Good air entry bilaterally, no added sounds  CVS: S1 S2 regular, no murmurs.  GI: Bowel sounds present, Non tender and not distended with no gaurding, rigidity or rebound.No organomegaly Extremities: B/L Lower Ext shows no edema, both legs are warm to touch Neurology:  speech clear,Non focal, sensation is grossly intact. Musculoskeletal:No digital cyanosis Skin:No Rash, warm and dry Wounds:N/A  I have personally reviewed following labs and imaging studies  LABORATORY DATA: CBC: Recent Labs  Lab 04/01/18 0929  04/02/18 0400 04/02/18 0744 04/03/18 0715 04/03/18 1425 04/04/18 0538 04/05/18 0630  WBC 7.6  --   --  15.3* 11.3*  --  10.8* 9.2  HGB 10.2*   < > 12.2* 14.2 12.1*  --  10.4* 10.9*  HCT 31.3*   < > 36.0* 42.2 36.7*  --  30.0* 31.4*  MCV 100.0  --   --  98.4 97.1  --  94.0 94.3  PLT 131*  --   --  54* 77* 74* 65* 74*   < > = values in this interval not displayed.    Basic Metabolic Panel: Recent Labs  Lab 04/01/18 1207 04/01/18 2221 04/02/18 0308  04/02/18 1112 04/02/18 1544  04/03/18 0715  04/04/18 0108 04/04/18 0538 04/04/18 1047 04/05/18 0223 04/05/18 0630  NA  --  145 143   < > 145 143  --  143  --   --  143  --   --  141  K  --  5.0 6.1*   < > 6.0* 5.2*   < > 4.0   < > 3.6 3.9 3.9 3.5 3.6  CL  --  112* 112*  --  113* 112*  --  108  --   --  103  --   --  103  CO2  --  24 20*  --  17* 17*  --  21*  --   --  23  --   --  21*  GLUCOSE  --  103* 93  --  145* 147*  --  111*  --   --  74  --   --  87  BUN   --  35* 37*  --  47* 54*  --  75*  --   --  91*  --   --  92*  CREATININE  --  3.13* 3.71*  --  4.65* 5.28*  --  6.36*  --   --  7.04*  --   --  5.51*  CALCIUM  --  8.6* 8.5*  --  8.2* 8.2*  --  8.3*  --   --  8.4*  --   --  8.9  MG 2.1  2.7* 2.9*  --   --   --   --  2.6*  --   --   --   --   --  2.4  PHOS  --   --  6.0*  --   --   --   --  4.5  --   --   --   --   --  5.0*   < > = values in this interval not displayed.    GFR: Estimated Creatinine Clearance: 16.5 mL/min (A) (by C-G formula based on SCr of 5.51 mg/dL (H)).  Liver Function Tests: Recent Labs  Lab 04/01/18 0929 04/03/18 0715 04/04/18 0538 04/05/18 0630  AST 15 6,128* 1,759* 788*  ALT 12 5,392* 3,630* 2,879*  ALKPHOS 49 66 64 98  BILITOT 0.7 0.6 0.4 0.7  PROT 5.6* 5.4* 5.5* 6.5  ALBUMIN 3.1* 3.0* 2.9* 3.4*   No results for input(s): LIPASE, AMYLASE in the last 168 hours. No results for input(s): AMMONIA in the last 168 hours.  Coagulation Profile: Recent Labs  Lab 04/03/18 1425 04/04/18 0538 04/05/18 0630  INR 1.69 1.70 1.46    Cardiac Enzymes: Recent Labs  Lab 04/01/18 2103 04/02/18 0950 04/02/18 1112 04/02/18 1540 04/02/18 2301  CKTOTAL  --  738*  --   --   --   TROPONINI <0.03 QUANTITY NOT SUFFICIENT, UNABLE TO PERFORM TEST 0.10* 0.14* 0.13*    BNP (last 3 results) No results for input(s): PROBNP in the last 8760 hours.  HbA1C: No results for input(s): HGBA1C in the last 72 hours.  CBG: Recent Labs  Lab 04/05/18 0020 04/05/18 0112 04/05/18 0148 04/05/18 0401 04/05/18 0812  GLUCAP 66* 73 88 98 72    Lipid Profile: No results for input(s): CHOL, HDL, LDLCALC, TRIG, CHOLHDL, LDLDIRECT in the last 72 hours.  Thyroid Function Tests: No results for input(s): TSH, T4TOTAL, FREET4, T3FREE, THYROIDAB in the last 72 hours.  Anemia Panel: No results for input(s): VITAMINB12, FOLATE, FERRITIN, TIBC, IRON, RETICCTPCT in the last 72 hours.  Urine analysis:    Component Value Date/Time    COLORURINE YELLOW 04/02/2018 2054   APPEARANCEUR CLOUDY (A) 04/02/2018 2054   LABSPEC 1.013 04/02/2018 2054   PHURINE 5.0 04/02/2018 2054   GLUCOSEU NEGATIVE 04/02/2018 2054   HGBUR SMALL (A) 04/02/2018 2054   BILIRUBINUR NEGATIVE 04/02/2018 2054   KETONESUR NEGATIVE 04/02/2018 2054   PROTEINUR 30 (A) 04/02/2018 2054   NITRITE NEGATIVE 04/02/2018 2054   LEUKOCYTESUR TRACE (A) 04/02/2018 2054    Sepsis Labs: Lactic Acid, Venous    Component Value Date/Time   LATICACIDVEN 2.7 (HH) 04/02/2018 1540    MICROBIOLOGY: Recent Results (from the past 240 hour(s))  Culture, blood (routine x 2)     Status: None (Preliminary result)   Collection Time: 04/01/18 12:00 PM  Result Value Ref Range Status   Specimen Description BLOOD RIGHT HAND  Final   Special Requests   Final    BOTTLES DRAWN AEROBIC AND ANAEROBIC Blood Culture results may not be optimal due to an inadequate volume of blood received in culture bottles Performed at [facility], [address] [place], [place] [address]    Culture NO GROWTH 4 DAYS  Final   Report Status PENDING  Incomplete  Culture, blood (routine x 2)     Status: None (Preliminary result)   Collection Time: 04/01/18 12:07 PM  Result Value Ref Range Status   Specimen Description BLOOD RIGHT FOREARM  Final   Special Requests  Final    BOTTLES DRAWN AEROBIC AND ANAEROBIC Blood Culture adequate volume Performed at [facility], [address], [place], [place] [address]    Culture NO GROWTH 4 DAYS  Final   Report Status PENDING  Incomplete  Urine culture     Status: None   Collection Time: 04/01/18  1:01 PM  Result Value Ref Range Status   Specimen Description URINE, CATHETERIZED  Final   Special Requests NONE  Final   Culture   Final    NO GROWTH Performed at [facility], [address] [place], [place] [address]    Report Status 04/02/2018 FINAL  Final  MRSA PCR Screening     Status: None   Collection Time: 04/01/18  5:36 PM    Result Value Ref Range Status   MRSA by PCR NEGATIVE NEGATIVE Final    Comment:        The GeneXpert MRSA Assay (FDA approved for NASAL specimens only), is one component of a comprehensive MRSA colonization surveillance program. It is not intended to diagnose MRSA infection nor to guide or monitor treatment for MRSA infections. Performed at [facility], [address], [place], [place] [address]   Culture, respiratory (tracheal aspirate)     Status: None (Preliminary result)   Collection Time: 04/02/18 12:07 AM  Result Value Ref Range Status   Specimen Description TRACHEAL ASPIRATE  Final   Special Requests NONE  Final   Gram Stain   Final    RARE WBC PRESENT, PREDOMINANTLY PMN ABUNDANT GRAM POSITIVE COCCI    Culture   Final    MODERATE STAPHYLOCOCCUS AUREUS SUSCEPTIBILITIES TO FOLLOW Performed at [facility], [address], [place], [place] [address]    Report Status PENDING  Incomplete  Respiratory Panel by PCR     Status: None   Collection Time: 04/02/18  5:29 PM  Result Value Ref Range Status   Adenovirus NOT DETECTED NOT DETECTED Final   Coronavirus 229E NOT DETECTED NOT DETECTED Final    Comment: (NOTE) The Coronavirus on the Respiratory Panel, DOES NOT test for the novel  Coronavirus (2019 nCoV)    Coronavirus HKU1 NOT DETECTED NOT DETECTED Final   Coronavirus NL63 NOT DETECTED NOT DETECTED Final   Coronavirus OC43 NOT DETECTED NOT DETECTED Final   Metapneumovirus NOT DETECTED NOT DETECTED Final   Rhinovirus / Enterovirus NOT DETECTED NOT DETECTED Final   Influenza A NOT DETECTED NOT DETECTED Final   Influenza B NOT DETECTED NOT DETECTED Final   Parainfluenza Virus 1 NOT DETECTED NOT DETECTED Final   Parainfluenza Virus 2 NOT DETECTED NOT DETECTED Final   Parainfluenza Virus 3 NOT DETECTED NOT DETECTED Final   Parainfluenza Virus 4 NOT DETECTED NOT DETECTED Final   Respiratory Syncytial Virus NOT DETECTED NOT DETECTED Final   Bordetella  pertussis NOT DETECTED NOT DETECTED Final   Chlamydophila pneumoniae NOT DETECTED NOT DETECTED Final   Mycoplasma pneumoniae NOT DETECTED NOT DETECTED Final    Comment: Performed at [facility], [address] [place], [place] [address]    RADIOLOGY STUDIES/RESULTS: Dg Abd 1 View  Result Date: 04/02/2018 CLINICAL DATA:  OG tube placement. EXAM: ABDOMEN - 1 VIEW COMPARISON:  Chest x-ray 04/02/2018. FINDINGS: OG tube noted with tip over the stomach. No bowel distention. Stool noted throughout the colon. No free air. No acute bony abnormality. IMPRESSION: OG tube noted with tip over the stomach.  No acute abnormality. Electronically Signed   By:  Thomas  Register   On: 04/02/2018 10:35   Ct Head Wo Contrast  Result Date: 04/01/2018 CLINICAL DATA:  Post CPR EXAM: CT HEAD WITHOUT CONTRAST TECHNIQUE: Contiguous axial images were obtained from the base of the skull through the vertex without intravenous contrast. COMPARISON:  08/10/2015 FINDINGS: Brain: No evidence of acute infarction, hemorrhage, hydrocephalus, extra-axial collection or mass lesion/mass effect. Vascular: No hyperdense vessel or unexpected calcification. Skull: Normal. Negative for fracture or focal lesion. Sinuses/Orbits: No acute finding. Other: None. IMPRESSION: No acute intracranial pathology. Electronically Signed   By: Lauralyn Primes M.D.   On: 04/01/2018 10:51   US Renal  Result Date: 04/02/2018 CLINICAL DATA:  Acute kidney injury EXAM: RENAL / URINARY TRACT ULTRASOUND COMPLETE COMPARISON:  None. FINDINGS: Right Kidney: Renal measurements: 11 x 4.3 x 4.7 cm = volume: 120.1 mL. Cortical echogenicity normal. No hydronephrosis. Possible scarring at the midpole. Left Kidney: Renal measurements: 10.3 x 3.7 x 5.7 cm = volume: 115.7 mL. Echogenicity within normal limits. No mass or hydronephrosis visualized. Bladder: Foley catheter in the bladder which is empty. Small amount of free fluid in the pelvis IMPRESSION: 1. Negative for  hydronephrosis. There may be mild cortical scarring at the mid right kidney 2. Small amount of free fluid in the pelvis Electronically Signed   By: Jasmine Pang M.D.   On: 04/02/2018 19:46   Dg Chest Port 1 View  Result Date: 04/03/2018 CLINICAL DATA:  Endotracheal tube present. Acute respiratory failure with hypoxia. EXAM: PORTABLE CHEST 1 VIEW COMPARISON:  One-view chest x-ray 04/02/2018 FINDINGS: Heart size is normal. Endotracheal tube is in satisfactory position. NG tube courses off the inferior border of the film. Progressive right middle lobe airspace disease is present. Lung volumes are low. IMPRESSION: 1. Progressive right middle lobe airspace disease concerning for infection. 2. Support apparatus is stable. Electronically Signed   By: Marin Roberts M.D.   On: 04/03/2018 07:01   Dg Chest Port 1 View  Result Date: 04/02/2018 CLINICAL DATA:  Intubated. Acute respiratory failure. EXAM: PORTABLE CHEST 1 VIEW COMPARISON:  04/01/2018. FINDINGS: The endotracheal tube remains in satisfactory position. Nasogastric tube extending into the stomach with its side hole in the proximal stomach. Stable borderline enlarged cardiac silhouette and clear lungs. Unremarkable bones. IMPRESSION: Stable borderline cardiomegaly. No acute abnormalities. Electronically Signed   By: Beckie Salts M.D.   On: 04/02/2018 09:11   Dg Chest Portable 1 View  Result Date: 04/01/2018 CLINICAL DATA:  Status post intubation. EXAM: PORTABLE CHEST 1 VIEW COMPARISON:  None. FINDINGS: The heart size and mediastinal contours are within normal limits. Both lungs are clear. Endotracheal tube is seen projected over tracheal air shadow with distal tip 5 cm above the carina. Distal tip of nasogastric tube is seen in proximal stomach. The visualized skeletal structures are unremarkable. IMPRESSION: Endotracheal tube in grossly good position. Distal tip of nasogastric tube in proximal stomach. No acute cardiopulmonary abnormality seen.  Electronically Signed   By: Lupita Raider, M.D.   On: 04/01/2018 10:20     LOS: 4 days   Jeoffrey Massed, MD  [facility]  If 7PM-7AM, please contact night-coverage  Please page via [email or web address]  Go to [email or web address] and use [facility]'s universal password to access. If you do not have the password, please contact the hospital operator.  Locate the [facility] provider you are looking for under [facility] and page to a number that you can be directly reached. If you still have difficulty reaching the provider, please page  the [facility] (Director on Call) for the Hospitalists listed on amion for assistance.  04/05/2018, 10:21 AM

## 2018-04-05 NOTE — Progress Notes (Signed)
Admit: 04/01/2018 LOS: 4  53 M Anuric AKI after cardiac arrest  Subjective:   . Out of ICU, ambulating . > 2L UOP, > 1.6L today so far . SCr downtrending, K and HCO3 ok . Renal ultrasound 2/20 with normal-sized kidneys bilaterally, no evidence of hydronephrosis.  02/22 0701 - 02/23 0700 In: 2022.8 [P.O.:1705; NG/GT:12.8; IV Piggyback:300] Out: 2110 [Urine:2110]   Filed Weights   04/03/18 0600 04/04/18 0500 04/05/18 0500  Weight: 75.9 kg 79.2 kg 73.6 kg    Scheduled Meds: . carvedilol  3.125 mg Oral BID WC  . folic acid  1 mg Intravenous Daily  . gabapentin  100 mg Oral BID  . heparin  5,000 Units Subcutaneous Q8H  . levETIRAcetam  500 mg Oral BID  . mouth rinse  15 mL Mouth Rinse BID  . thiamine injection  100 mg Intravenous Q24H   Continuous Infusions: . sodium chloride 10 mL/hr at 04/03/18 1800  . cefTRIAXone (ROCEPHIN)  IV 2 g (04/04/18 2149)  . dextrose 10 mL/hr at 04/04/18 2022   PRN Meds:.hydrALAZINE, LORazepam, QUEtiapine  Current Labs: reviewed  Results for STEPHAN, STRANZ (MRN [id number]) as of 04/03/2018 09:16  Ref. Range 04/02/2018 09:50  CK Total Latest Ref Range: 49 - 397 U/L 738 (H)    Physical Exam:  Blood pressure (!) 147/97, pulse 78, temperature 98 F (36.7 C), temperature source Oral, resp. rate 20, height 6\' 2"  (1.88 m), weight 73.6 kg, SpO2 (!) 88 %. Intubated, awake, not interactive or following commands Regular, normal S1 and S2, normal rate, no rub Coarse bilaterally No significant peripheral edema Mild abdominal distention, soft, bowel sounds present No rashes or lesions EOMI NCAT with ET tube in place  A 1. Resolving AKI, presumed ATN from cardiac arrest and shock, nonoliguric; seems will recover, perhaps completely.  No further suggestions.  No outpt f/u unless has sustained GFR decrease once levels out.  2. PEA cardiac arrest with acute systolic heart failure, AHF following.   3. Suspicion of misuse of prescription  medication/overdose 4. Acute respiratory failure, VDRF; resolved 5. Transaminitis; massive, ?ischemic, improving  P . Continue supportive care . Will sign off for now.  Please call with any questions or concerns.  Pt does not need follow up with nephrology unless sustained low GFR  . Medication Issues; o Preferred narcotic agents for pain control are hydromorphone, fentanyl, and methadone. Morphine should not be used.  o Baclofen should be avoided o Avoid oral sodium phosphate and magnesium citrate based laxatives / bowel preps    Sabra Heck MD 04/05/2018, 11:28 AM  Recent Labs  Lab 04/02/18 0308  04/03/18 0715  04/04/18 0538 04/04/18 1047 04/05/18 0223 04/05/18 0630  NA 143   < > 143  --  143  --   --  141  K 6.1*   < > 4.0   < > 3.9 3.9 3.5 3.6  CL 112*   < > 108  --  103  --   --  103  CO2 20*   < > 21*  --  23  --   --  21*  GLUCOSE 93   < > 111*  --  74  --   --  87  BUN 37*   < > 75*  --  91*  --   --  92*  CREATININE 3.71*   < > 6.36*  --  7.04*  --   --  5.51*  CALCIUM 8.5*   < > 8.3*  --  8.4*  --   --  8.9  PHOS 6.0*  --  4.5  --   --   --   --  5.0*   < > = values in this interval not displayed.   Recent Labs  Lab 04/03/18 0715 04/03/18 1425 04/04/18 0538 04/05/18 0630  WBC 11.3*  --  10.8* 9.2  HGB 12.1*  --  10.4* 10.9*  HCT 36.7*  --  30.0* 31.4*  MCV 97.1  --  94.0 94.3  PLT 77* 74* 65* 74*

## 2018-04-05 NOTE — Progress Notes (Signed)
  Speech Language Pathology Treatment: Dysphagia  Patient Details Name: David Dennis MRN: [id number] DOB: [birth year] Today's Date: 04/05/2018 Time: 1700-1707 SLP Time Calculation (min) (ACUTE ONLY): 7 min  Assessment / Plan / Recommendation Clinical Impression  Pt was seen for dysphagia treatment to assess tolerance of the current diet and to determine his ability to tolerate more advanced consistencies. Pt's tech/sitter reported that he was been tolerating the current diet well but has not been eating much. The pt indicated that this is due to his poor appetite. Pt tolerated regular texture solids and consecutive swallows of thin liquids without overt s/sx of aspiration or any other symptoms of oropharyngeal dysphagia. His diet will be upgraded to regular consistency at this time and SLP will follow to ensure tolerance of the upgraded diet.    HPI HPI: Pt is a 53 year old male who presented to the ED on 2/19 after experiencing a cardiac arrest. CPR x 3 minutes with ROSC. Empty medication bottles were found in pt's room including metoprolol, clonazepam, and olanzapine. Concern for overdose. Pt was intubated 2/19-2/21. PMH significant for HTN, depression, and anxiety.       SLP Plan  Goals updated       Recommendations  Diet recommendations: Regular;Thin liquid Liquids provided via: Cup;Straw Medication Administration: Whole meds with puree Supervision: Patient able to self feed Compensations: Slow rate Postural Changes and/or Swallow Maneuvers: Seated upright 90 degrees                Oral Care Recommendations: Oral care BID Follow up Recommendations: 24 hour supervision/assistance SLP Visit Diagnosis: Dysphagia, unspecified (R13.10) Plan: Goals updated       Jobie Popp I. Vear Clock, MS, CCC-SLP Acute Rehabilitation Services Office number [phone] Pager [phone]              Scheryl Marten 04/05/2018, 5:35 PM

## 2018-04-06 LAB — RENAL FUNCTION PANEL
ANION GAP: 16 — AB (ref 5–15)
Albumin: 3.4 g/dL — ABNORMAL LOW (ref 3.5–5.0)
BUN: 75 mg/dL — ABNORMAL HIGH (ref 6–20)
CO2: 22 mmol/L (ref 22–32)
Calcium: 9.1 mg/dL (ref 8.9–10.3)
Chloride: 103 mmol/L (ref 98–111)
Creatinine, Ser: 3.63 mg/dL — ABNORMAL HIGH (ref 0.61–1.24)
GFR calc Af Amer: 21 mL/min — ABNORMAL LOW (ref >60)
GFR calc non Af Amer: 18 mL/min — ABNORMAL LOW (ref >60)
Glucose, Bld: 85 mg/dL (ref 70–99)
Phosphorus: 4.1 mg/dL (ref 2.5–4.6)
Potassium: 3.1 mmol/L — ABNORMAL LOW (ref 3.5–5.1)
SODIUM: 141 mmol/L (ref 135–145)

## 2018-04-06 LAB — CBC
HCT: 33.1 % — ABNORMAL LOW (ref 39.0–52.0)
HEMOGLOBIN: 11.4 g/dL — AB (ref 13.0–17.0)
MCH: 32.3 pg (ref 26.0–34.0)
MCHC: 34.4 g/dL (ref 30.0–36.0)
MCV: 93.8 fL (ref 80.0–100.0)
Platelets: 82 10*3/uL — ABNORMAL LOW (ref 150–400)
RBC: 3.53 MIL/uL — AB (ref 4.22–5.81)
RDW: 13.3 % (ref 11.5–15.5)
WBC: 8.5 10*3/uL (ref 4.0–10.5)
nRBC: 0.5 % — ABNORMAL HIGH (ref 0.0–0.2)

## 2018-04-06 LAB — GLUCOSE, CAPILLARY
Glucose-Capillary: 80 mg/dL (ref 70–99)
Glucose-Capillary: 76 mg/dL (ref 70–99)
Glucose-Capillary: 84 mg/dL (ref 70–99)
Glucose-Capillary: 118 mg/dL — ABNORMAL HIGH (ref 70–99)
Glucose-Capillary: 112 mg/dL — ABNORMAL HIGH (ref 70–99)
Glucose-Capillary: 94 mg/dL (ref 70–99)

## 2018-04-06 LAB — HEPATIC FUNCTION PANEL
ALT: 1993 U/L — ABNORMAL HIGH (ref 0–44)
AST: 375 U/L — ABNORMAL HIGH (ref 15–41)
Albumin: 3.4 g/dL — ABNORMAL LOW (ref 3.5–5.0)
Alkaline Phosphatase: 109 U/L (ref 38–126)
BILIRUBIN TOTAL: 0.8 mg/dL (ref 0.3–1.2)
Bilirubin, Direct: 0.3 mg/dL — ABNORMAL HIGH (ref 0.0–0.2)
Indirect Bilirubin: 0.5 mg/dL (ref 0.3–0.9)
Total Protein: 6.6 g/dL (ref 6.5–8.1)

## 2018-04-06 LAB — CULTURE, BLOOD (ROUTINE X 2)
Culture: NO GROWTH
Culture: NO GROWTH
Special Requests: ADEQUATE

## 2018-04-06 MED ORDER — FOLIC ACID 1 MG PO TABS
1.0000 mg | ORAL_TABLET | Freq: Every day | ORAL | Status: DC
  Administered 2018-04-06 – 2018-04-07 (×2): 1 mg via ORAL
  Filled 2018-04-06 (×2): qty 1

## 2018-04-06 MED ORDER — ENSURE ENLIVE PO LIQD
237.0000 mL | Freq: Two times a day (BID) | ORAL | Status: DC
  Administered 2018-04-06 – 2018-04-08 (×4): 237 mL via ORAL

## 2018-04-06 MED ORDER — POTASSIUM CHLORIDE CRYS ER 20 MEQ PO TBCR
40.0000 meq | EXTENDED_RELEASE_TABLET | Freq: Every day | ORAL | Status: DC
  Administered 2018-04-06 – 2018-04-08 (×3): 40 meq via ORAL
  Filled 2018-04-06 (×3): qty 2

## 2018-04-06 MED ORDER — ADULT MULTIVITAMIN W/MINERALS CH
1.0000 | ORAL_TABLET | Freq: Every day | ORAL | Status: DC
  Administered 2018-04-06 – 2018-04-08 (×3): 1 via ORAL
  Filled 2018-04-06 (×3): qty 1

## 2018-04-06 MED ORDER — VITAMIN B-1 100 MG PO TABS
100.0000 mg | ORAL_TABLET | Freq: Every day | ORAL | Status: DC
  Administered 2018-04-06 – 2018-04-07 (×2): 100 mg via ORAL
  Filled 2018-04-06 (×2): qty 1

## 2018-04-06 MED ORDER — CEPHALEXIN 500 MG PO CAPS
500.0000 mg | ORAL_CAPSULE | Freq: Two times a day (BID) | ORAL | Status: DC
  Administered 2018-04-06 – 2018-04-07 (×4): 500 mg via ORAL
  Filled 2018-04-06 (×5): qty 1

## 2018-04-06 NOTE — Progress Notes (Signed)
Physical Therapy Discharge Patient Details Name: AIDRIAN CUFFE MRN: [id number] DOB: 1965-11-21 Today's Date: 04/06/2018  Per chart review, pt has been ambulating multiple times today without assistance. Observed pt ambulating in hallway with OT and appeared independent without any gait deviations. Discussed pt case with OT after session and OT agreed pt has functionally progressed and no longer has acute PT needs. Will sign off at this time. If needs change, please reconsult.    Marylynn Pearson 04/06/2018, 1:51 PM   Conni Slipper, PT, DPT Acute Rehabilitation Services Pager: [phone] Office: [phone]

## 2018-04-06 NOTE — Progress Notes (Signed)
  Speech Language Pathology Treatment: Dysphagia  Patient Details Name: David Dennis MRN: [id number] DOB: 1965-10-13 Today's Date: 04/06/2018 Time: 4818-5909 SLP Time Calculation (min) (ACUTE ONLY): 9 min  Assessment / Plan / Recommendation Clinical Impression  Pt is tolerating regular solids, thin liquids well per observation today.  There was no further evidence of dysphagia, no s/s of aspiration.  Lung sounds are clear.  There are no further risk factors for aspiration. SLP services to sign off.   HPI HPI: Pt is a 53 year old male who presented to the ED on 2/19 after experiencing a cardiac arrest. CPR x 3 minutes with ROSC. Empty medication bottles were found in pt's room including metoprolol, clonazepam, and olanzapine. Concern for overdose. Pt was intubated 2/19-2/21. PMH significant for HTN, depression, and anxiety.       SLP Plan  All goals met       Recommendations  Diet recommendations: Regular;Thin liquid Liquids provided via: Cup;Straw Medication Administration: Whole meds with liquid Supervision: Patient able to self feed                Oral Care Recommendations: Oral care BID Follow up Recommendations: None SLP Visit Diagnosis: Dysphagia, unspecified (R13.10) Plan: All goals met       GO               Ramez Arrona L. Tivis Ringer, [place] Office number [phone] Pager [phone]  Assunta Curtis 04/06/2018, 2:49 PM

## 2018-04-06 NOTE — Progress Notes (Signed)
Patient ambulated multiple times in the hallway this shift, with no help. No acute distress noted, no complaints. Will continue to monitor.

## 2018-04-06 NOTE — Progress Notes (Signed)
4pm-CSW received confirmation of IVC from magistrate and placed on shadow chart. Patient will be served.   2pm-CSW notified that patient needs to be IVC'd for [facility] placement. Will begin paperwork.   Osborne Casco Reise Hietala LCSW [phone]

## 2018-04-06 NOTE — Evaluation (Signed)
Occupational Therapy Evaluation Patient Details Name: David Dennis MRN: [id number] DOB: 04-20-1965 Today's Date: 04/06/2018    History of Present Illness 53 year old male who presented to the ED on 2/19 after experiencing a cardiac arrest. CPR x 3 minutes with ROSC. Empty medication bottles were found in pt's room including metoprolol, clonazepam, and olanzapine. Concern for overdose. Pt was intubated 2/19-2/21. PMH significant for HTN, depression, and anxiety.   Clinical Impression   Pt admitted with above dx and now presenting with very mild balance deficits. Pt able to perform 500+ ft of functional mobility without AD during session and complete ADLs at mod I level. Extensive conversation with pt re regintegration/participation in leisure activities and IADLs of choice. Used motivational interviewing techniques to guide pt toward self-directed/established goals for managing depression and self-reported social anxiety. Pt receptive and would benefit from either behavioral health or general counseling follow up if this can be arranged. Otherwise, no further acute OT needs at this time.   Of note: pt has limited L shoulder flexion (~85 degrees) and (+) Drop arm test indicating potential rotator cuff dysfunction. No pain or report of injury and pt reports this is acute.     Follow Up Recommendations  No OT follow up    Equipment Recommendations  None recommended by OT       Precautions / Restrictions Precautions Precautions: Other (comment)(1:1 sitter) Restrictions Weight Bearing Restrictions: No      Mobility Bed Mobility Overal bed mobility: Independent Bed Mobility: Supine to Sit Rolling: Independent         General bed mobility comments: Pt able to move around in bed, supine to sitting and then sitting to EOB with no difficulties  Transfers Overall transfer level: Modified independent Equipment used: None Transfers: Sit to/from Stand Sit to Stand: Modified  independent (Device/Increase time)              Balance Overall balance assessment: Mild deficits observed, not formally tested                                         ADL either performed or assessed with clinical judgement   ADL Overall ADL's : At baseline;Modified independent                                       General ADL Comments: Overall at modified independent, instructed pt in general energy conservation strategies      Vision Baseline Vision/History: No visual deficits Patient Visual Report: No change from baseline Vision Assessment?: No apparent visual deficits            Pertinent Vitals/Pain Pain Assessment: 0-10     Hand Dominance Right   Extremity/Trunk Assessment Upper Extremity Assessment Upper Extremity Assessment: LUE deficits/detail LUE Deficits / Details: Pt reported deficits in L UE AROM, positive for drop arm test, no pain otherwise. WFL R UE and all other LUE joints.  LUE Sensation: WNL LUE Coordination: decreased gross motor   Lower Extremity Assessment Lower Extremity Assessment: Overall WFL for tasks assessed   Cervical / Trunk Assessment Cervical / Trunk Assessment: Normal   Communication Communication Communication: No difficulties   Cognition Arousal/Alertness: Awake/alert Behavior During Therapy: Flat affect Overall Cognitive Status: Within Functional Limits for tasks assessed  General Comments: Flat affect overall but reports he is uncomfortable around people a lot and very reserved    General Comments  Extensive conversation with pt re regintegration/participation in leisure activities and IADLs of choice. Used motivational interviewing techniques to guide pt toward self-directed/established goals for managing depression and self-reported social anxiety            Home Living Family/patient expects to be discharged to:: Private  residence Living Arrangements: Parent Available Help at Discharge: Family Type of Home: House Home Access: Stairs to enter Secretary/administrator of Steps: 3 Entrance Stairs-Rails: None Home Layout: Two level;Bed/bath upstairs Alternate Level Stairs-Number of Steps: flight Alternate Level Stairs-Rails: Can reach both Bathroom Shower/Tub: Chief Strategy Officer: Standard     Home Equipment: None          Prior Functioning/Environment Level of Independence: Independent        Comments: Pt reports he was "not doing much" and hopes to get better         OT Problem List: Decreased range of motion;Impaired balance (sitting and/or standing)         OT Goals(Current goals can be found in the care plan section) Acute Rehab OT Goals Patient Stated Goal: "to get my life better" OT Goal Formulation: All assessment and education complete, DC therapy   AM-PAC OT "6 Clicks" Daily Activity     Outcome Measure Help from another person eating meals?: None Help from another person taking care of personal grooming?: None Help from another person toileting, which includes using toliet, bedpan, or urinal?: None Help from another person bathing (including washing, rinsing, drying)?: None Help from another person to put on and taking off regular upper body clothing?: None Help from another person to put on and taking off regular lower body clothing?: None 6 Click Score: 24   End of Session Equipment Utilized During Treatment: Gait belt  Activity Tolerance: Patient tolerated treatment well Patient left: in bed;with call bell/phone within reach;with nursing/sitter in room  OT Visit Diagnosis: Unsteadiness on feet (R26.81)                Time: 3335-4562 OT Time Calculation (min): 24 min Charges:  OT General Charges $OT Visit: 1 Visit OT Evaluation $OT Eval Low Complexity: 1 Low OT Treatments $Self Care/Home Management : 8-22 mins  Crissie Reese OTR/L  04/06/2018, 2:23  PM

## 2018-04-06 NOTE — Progress Notes (Signed)
Nutrition Follow-up  DOCUMENTATION CODES:   Non-severe (moderate) malnutrition in context of social or environmental circumstances  INTERVENTION:   - Ensure Enlive po BID, each supplement provides 350 kcal and 20 grams of protein  - MVI with minerals daily  NUTRITION DIAGNOSIS:   Moderate Malnutrition related to social / environmental circumstances as evidenced by mild fat depletion, mild muscle depletion, moderate muscle depletion.  New diagnosis  GOAL:   Patient will meet greater than or equal to 90% of their needs  Progressing  MONITOR:   PO intake, Supplement acceptance, Weight trends, I & O's, Labs  REASON FOR ASSESSMENT:   Ventilator, Consult Enteral/tube feeding initiation and management  ASSESSMENT:   53 year old male who presented to the ED on 2/19 after experiencing a cardiac arrest. CPR x 3 minutes with ROSC. Empty medication bottles were found in pt's room including metoprolol, clonazepam, and olanzapine. Concern for overdose. Pt was intubated in the ED. PMH significant for HTN, depression, and anxiety.  2/21 - extubated, diet advanced to Dysphagia 3 2/23 - diet advanced to Regular  Psychiatry has recommended psychiatric inpatient admission when medically cleared.  Weight has fluctuated between 158-174 lbs during admission which could be related to fluid status and difference in bed weights after transfer out of ICU.  Spoke with pt at bedside. Sitter in room. Pt states that his appetite is improving but that he is still having some soreness with swallowing. Pt shares that he had his first bowel movement in several days yesterday. Pt reports eating eggs, bacon, and fruit this morning for breakfast and completed 100%. Confirmed in flowsheets.  RD was able to obtain diet and weight history PTA. On initial assessment, pt was intubated with no family present.  Pt reports that he usually has a good appetite at home. Pt shares that when he is stressed, he does  notice that he eats less and loses weight.  Pt reports typically eating 3 meals daily with snacks.  Breakfast: cereal Lunch: hotdog Dinner: hotdog or TV dinner with canned vegetables or "out with my folks" Snacks: banana or apple Drinks: diet Coke and water  Pt reports that his UBW is 175-180 lbs and has noted that he has lost weight. Pt is unsure of timeframe. Weight history in chart is limited as there are no weights recorded prior to this admission.  Pt is amenable to trying Ensure Enlive oral nutrition supplements to aid in meeting kcal and protein needs. RD to order.  Per [facility] on initial assessment, pt with mild fat depletions and mild to moderate muscle depletions. Given psych history, pt meets criteria for moderate malnutrition in the context of social/environmental circumstances.  Meal Completion: 0-100% x 7 meals  Medications reviewed and include: folic acid, thiamine, IV antibiotics  Labs reviewed: potassium 3.1 (L), BUN 75 (H), creatinine 3.63 (H), elevated LFTs CBG's: 84, 76, 80, 78, 73 x 24 hours  UOP: 3875 ml x 24 hours I/O's: +2.1 L since admit  Diet Order:   Diet Order            Diet regular Room service appropriate? Yes; Fluid consistency: Thin  Diet effective now              EDUCATION NEEDS:   Education needs have been addressed  Skin:  Skin Assessment: Reviewed RN Assessment  Last BM:  2/22  Height:   Ht Readings from Last 1 Encounters:  04/01/18 6\' 2"  (1.88 m)    Weight:   Wt Readings from Last 1  Encounters:  04/06/18 73 kg    Ideal Body Weight:  86.4 kg  BMI:  Body mass index is 20.67 kg/m.  Estimated Nutritional Needs:   Kcal:  2300-2500  Protein:  110-125 grams  Fluid:  >/= 2.3 L    Earma Reading, MS, RD, LDN Inpatient Clinical Dietitian Pager: [phone] Weekend/After Hours: [phone]

## 2018-04-06 NOTE — Progress Notes (Signed)
PHARMACIST - PHYSICIAN COMMUNICATION  DR:   Jerral Ralph  CONCERNING: IV to Oral Route Change Policy  RECOMMENDATION: This patient is receiving folic acid/thiamine by the intravenous route.  Based on criteria approved by the Pharmacy and Therapeutics Committee, the intravenous medication(s) is/are being converted to the equivalent oral dose form(s).   DESCRIPTION: These criteria include:  The patient is eating (either orally or via tube) and/or has been taking other orally administered medications for a least 24 hours  The patient has no evidence of active gastrointestinal bleeding or impaired GI absorption (gastrectomy, short bowel, patient on TNA or NPO).  If you have questions about this conversion, please contact the Pharmacy Department  []   [phone] )  [facility] []   [phone] )  [facility] [x]   [phone] )  [facility] []   [phone] )  [facility] []   [phone] )  [facility]   [place], [place] 04/06/2018 9:27 AM

## 2018-04-06 NOTE — Progress Notes (Addendum)
PROGRESS NOTE        PATIENT DETAILS Name: David Dennis Age: 53 y.o. Sex: male Date of Birth: 07/19/1965 Admit Date: 04/01/2018 Admitting Physician Chi Mechele Collin, MD [id number], [facility]  Brief Narrative: Patient is a 53 y.o. male with history of depression, anxiety, hypertension, seizure disorder-into the emergency room unresponsive, pulseless, received 3 minutes of CPR with ROSC-admitted to the ICU intubated-further evaluation revealed AKI, shock liver and cardiomyopathy.  Now thought that this was probably secondary to a suicidal attempt with drug overdose.  See below for further details.  Subjective: Awake and alert-mother at bedside.  No major issues overnight.  Denies any chest pain or shortness of breath  Assessment/Plan: Acute hypoxic respiratory failure: Intubated in the emergency room due to failure to protect airway--subsequently extubated- 2/20-due to continued clinical improvement, has now been titrated to room air.  AKI: Likely ATN in the setting of cardiac arrest-creatinine continues to to downtrend.  Other electrolytes stable-follow electrolytes periodically.  Elevated transaminases secondary to shock liver: LFTs continue to downtrend, hepatitis serology was negative.  Follow LFTs periodically.    PEA arrest: Thought to be in the setting of presumed drug overdose.  Cardiology/CHF team recommending supportive care, once renal function improves further-cardiology will perform work-up in the future.  Acute systolic heart failure (EF 20-25%): Thought to be due to myocardial stunning secondary to cardiac arrest.  Volume status is currently stable.  Blood pressure much stable-continue low-dose Coreg.  Continue to avoid ACEI/ARB due to renal function.  Per CHF team-they will perform further work-up in the future-once patient's renal function stabilizes further.   Fever likely secondary to aspiration pneumonia: Does have a documented  right lung infiltrate on chest x-ray on 2/21-sputum cultures positive for MSSA.  On Rocephin-we will switch over to Keflex.  Afebrile for the past few days.    Hypertension: Blood pressure stable-continue low-dose Coreg which she seems to be tolerating well.  Seizure disorder: Continue Keppra  Drug overdose/suicidal attempt: Evaluated by psychiatry-recommendations are to transfer to inpatient psych.  Depression/anxiety: Appears depressed-but this morning claims his mood is stable.  Gabapentin 100 mg twice daily, and Seroquel as needed at bedtime.  See above regarding plans to transfer to inpatient psych when he is more stable  Moderate Malnutrition   DVT Prophylaxis: Prophylactic Heparin   Code Status: DNR  Family Communication: Mother at bedside  Disposition Plan: Remain inpatient-inpatient psychiatry when renal/liver function improves further.  Antimicrobial agents: Anti-infectives (From admission, onward)   Start     Dose/Rate Route Frequency Ordered Stop   04/01/18 2200  cefTRIAXone (ROCEPHIN) 2 g in sodium chloride 0.9 % 100 mL IVPB     2 g 200 mL/hr over 30 Minutes Intravenous Every 24 hours 04/01/18 1851        Procedures: None  CONSULTS:  cardiology, pulmonary/intensive care and psychiatry  Time spent: 25 minutes-Greater than 50% of this time was spent in counseling, explanation of diagnosis, planning of further management, and coordination of care.  MEDICATIONS: Scheduled Meds: . carvedilol  3.125 mg Oral BID WC  . folic acid  1 mg Oral QHS  . gabapentin  100 mg Oral BID  . heparin  5,000 Units Subcutaneous Q8H  . levETIRAcetam  500 mg Oral BID  . mouth rinse  15 mL Mouth Rinse BID  . thiamine  100 mg Oral  QHS   Continuous Infusions: . sodium chloride 10 mL/hr at 04/03/18 1800  . cefTRIAXone (ROCEPHIN)  IV 2 g (04/05/18 2152)  . dextrose 10 mL/hr at 04/04/18 2022   PRN Meds:.hydrALAZINE, LORazepam, QUEtiapine   PHYSICAL EXAM: Vital signs: Vitals:    04/05/18 2121 04/06/18 0101 04/06/18 0457 04/06/18 0647  BP: (!) 158/110 (!) 152/107 (!) 158/110   Pulse: 76 80 75   Resp: 18 19 16    Temp: 98.2 F (36.8 C) 98.5 F (36.9 C) 98 F (36.7 C)   TempSrc: Oral Oral Oral   SpO2: 94% 93% 95%   Weight:    73 kg  Height:       Filed Weights   04/04/18 0500 04/05/18 0500 04/06/18 0647  Weight: 79.2 kg 73.6 kg 73 kg   Body mass index is 20.67 kg/m.   General appearance:Awake, alert, not in any distress.  Eyes:no scleral icterus. HEENT: Atraumatic and Normocephalic Neck: supple, no JVD. Resp:Good air entry bilaterally,no rales or rhonchi CVS: S1 S2 regular, no murmurs.  GI: Bowel sounds present, Non tender and not distended with no gaurding, rigidity or rebound. Extremities: B/L Lower Ext shows no edema, both legs are warm to touch Neurology:  Non focal Psychiatric: Normal judgment and insight. Normal mood. Musculoskeletal:No digital cyanosis Skin:No Rash, warm and dry Wounds:N/A  I have personally reviewed following labs and imaging studies  LABORATORY DATA: CBC: Recent Labs  Lab 04/02/18 0744 04/03/18 0715 04/03/18 1425 04/04/18 0538 04/05/18 0630 04/06/18 0412  WBC 15.3* 11.3*  --  10.8* 9.2 8.5  HGB 14.2 12.1*  --  10.4* 10.9* 11.4*  HCT 42.2 36.7*  --  30.0* 31.4* 33.1*  MCV 98.4 97.1  --  94.0 94.3 93.8  PLT 54* 77* 74* 65* 74* 82*    Basic Metabolic Panel: Recent Labs  Lab 04/01/18 1207 04/01/18 2221 04/02/18 0308  04/02/18 1544  04/03/18 0715  04/04/18 0538 04/04/18 1047 04/05/18 0223 04/05/18 0630 04/06/18 0412  NA  --  145 143   < > 143  --  143  --  143  --   --  141 141  K  --  5.0 6.1*   < > 5.2*   < > 4.0   < > 3.9 3.9 3.5 3.6 3.1*  CL  --  112* 112*   < > 112*  --  108  --  103  --   --  103 103  CO2  --  24 20*   < > 17*  --  21*  --  23  --   --  21* 22  GLUCOSE  --  103* 93   < > 147*  --  111*  --  74  --   --  87 85  BUN  --  35* 37*   < > 54*  --  75*  --  91*  --   --  92* 75*    CREATININE  --  3.13* 3.71*   < > 5.28*  --  6.36*  --  7.04*  --   --  5.51* 3.63*  CALCIUM  --  8.6* 8.5*   < > 8.2*  --  8.3*  --  8.4*  --   --  8.9 9.1  MG 2.1 2.7* 2.9*  --   --   --  2.6*  --   --   --   --  2.4  --   PHOS  --   --  6.0*  --   --   --  4.5  --   --   --   --  5.0* 4.1   < > = values in this interval not displayed.    GFR: Estimated Creatinine Clearance: 24.9 mL/min (A) (by C-G formula based on SCr of 3.63 mg/dL (H)).  Liver Function Tests: Recent Labs  Lab 04/01/18 0929 04/03/18 0715 04/04/18 0538 04/05/18 0630 04/06/18 0412  AST 15 6,128* 1,759* 788* 375*  ALT 12 5,392* 3,630* 2,879* 1,993*  ALKPHOS 49 66 64 98 109  BILITOT 0.7 0.6 0.4 0.7 0.8  PROT 5.6* 5.4* 5.5* 6.5 6.6  ALBUMIN 3.1* 3.0* 2.9* 3.4* 3.4*  3.4*   No results for input(s): LIPASE, AMYLASE in the last 168 hours. No results for input(s): AMMONIA in the last 168 hours.  Coagulation Profile: Recent Labs  Lab 04/03/18 1425 04/04/18 0538 04/05/18 0630  INR 1.69 1.70 1.46    Cardiac Enzymes: Recent Labs  Lab 04/01/18 2103 04/02/18 0950 04/02/18 1112 04/02/18 1540 04/02/18 2301  CKTOTAL  --  738*  --   --   --   TROPONINI <0.03 QUANTITY NOT SUFFICIENT, UNABLE TO PERFORM TEST 0.10* 0.14* 0.13*    BNP (last 3 results) No results for input(s): PROBNP in the last 8760 hours.  HbA1C: No results for input(s): HGBA1C in the last 72 hours.  CBG: Recent Labs  Lab 04/05/18 1620 04/05/18 2135 04/06/18 0058 04/06/18 0558 04/06/18 0742  GLUCAP 73 78 80 76 84    Lipid Profile: No results for input(s): CHOL, HDL, LDLCALC, TRIG, CHOLHDL, LDLDIRECT in the last 72 hours.  Thyroid Function Tests: No results for input(s): TSH, T4TOTAL, FREET4, T3FREE, THYROIDAB in the last 72 hours.  Anemia Panel: No results for input(s): VITAMINB12, FOLATE, FERRITIN, TIBC, IRON, RETICCTPCT in the last 72 hours.  Urine analysis:    Component Value Date/Time   COLORURINE YELLOW 04/02/2018  2054   APPEARANCEUR CLOUDY (A) 04/02/2018 2054   LABSPEC 1.013 04/02/2018 2054   PHURINE 5.0 04/02/2018 2054   GLUCOSEU NEGATIVE 04/02/2018 2054   HGBUR SMALL (A) 04/02/2018 2054   BILIRUBINUR NEGATIVE 04/02/2018 2054   KETONESUR NEGATIVE 04/02/2018 2054   PROTEINUR 30 (A) 04/02/2018 2054   NITRITE NEGATIVE 04/02/2018 2054   LEUKOCYTESUR TRACE (A) 04/02/2018 2054    Sepsis Labs: Lactic Acid, Venous    Component Value Date/Time   LATICACIDVEN 2.7 (HH) 04/02/2018 1540    MICROBIOLOGY: Recent Results (from the past 240 hour(s))  Culture, blood (routine x 2)     Status: None (Preliminary result)   Collection Time: 04/01/18 12:00 PM  Result Value Ref Range Status   Specimen Description BLOOD RIGHT HAND  Final   Special Requests   Final    BOTTLES DRAWN AEROBIC AND ANAEROBIC Blood Culture results may not be optimal due to an inadequate volume of blood received in culture bottles Performed at [facility], [address] [place], [place] [address]    Culture NO GROWTH 4 DAYS  Final   Report Status PENDING  Incomplete  Culture, blood (routine x 2)     Status: None (Preliminary result)   Collection Time: 04/01/18 12:07 PM  Result Value Ref Range Status   Specimen Description BLOOD RIGHT FOREARM  Final   Special Requests   Final    BOTTLES DRAWN AEROBIC AND ANAEROBIC Blood Culture adequate volume Performed at [facility], [address] [place], [place] [address]    Culture NO GROWTH 4  DAYS  Final   Report Status PENDING  Incomplete  Urine culture     Status: None   Collection Time: 04/01/18  1:01 PM  Result Value Ref Range Status   Specimen Description URINE, CATHETERIZED  Final   Special Requests NONE  Final   Culture   Final    NO GROWTH Performed at  [facility], [address] [place], [place] [address]    Report Status 04/02/2018 FINAL  Final  MRSA PCR Screening     Status: None   Collection Time: 04/01/18  5:36 PM  Result Value Ref Range Status    MRSA by PCR NEGATIVE NEGATIVE Final    Comment:        The GeneXpert MRSA Assay (FDA approved for NASAL specimens only), is one component of a comprehensive MRSA colonization surveillance program. It is not intended to diagnose MRSA infection nor to guide or monitor treatment for MRSA infections. Performed at [facility], [address], [place], [place] [address]   Culture, respiratory (tracheal aspirate)     Status: None   Collection Time: 04/02/18 12:07 AM  Result Value Ref Range Status   Specimen Description TRACHEAL ASPIRATE  Final   Special Requests NONE  Final   Gram Stain   Final    RARE WBC PRESENT, PREDOMINANTLY PMN ABUNDANT GRAM POSITIVE COCCI Performed at [facility], [address] [place], [place] [address]    Culture MODERATE STAPHYLOCOCCUS AUREUS  Final   Report Status 04/05/2018 FINAL  Final   Organism ID, Bacteria STAPHYLOCOCCUS AUREUS  Final      Susceptibility   Staphylococcus aureus - MIC*    CIPROFLOXACIN <=0.5 SENSITIVE Sensitive     ERYTHROMYCIN RESISTANT Resistant     GENTAMICIN <=0.5 SENSITIVE Sensitive     OXACILLIN 0.5 SENSITIVE Sensitive     TETRACYCLINE <=1 SENSITIVE Sensitive     VANCOMYCIN <=0.5 SENSITIVE Sensitive     TRIMETH/SULFA <=10 SENSITIVE Sensitive     CLINDAMYCIN RESISTANT Resistant     RIFAMPIN <=0.5 SENSITIVE Sensitive     Inducible Clindamycin POSITIVE Resistant     * MODERATE STAPHYLOCOCCUS AUREUS  Respiratory Panel by PCR     Status: None   Collection Time: 04/02/18  5:29 PM  Result Value Ref Range Status   Adenovirus NOT DETECTED NOT DETECTED Final   Coronavirus 229E NOT DETECTED NOT DETECTED Final    Comment: (NOTE) The Coronavirus on the Respiratory Panel, DOES NOT test for the novel  Coronavirus (2019 nCoV)    Coronavirus HKU1 NOT DETECTED NOT DETECTED Final   Coronavirus NL63 NOT DETECTED NOT DETECTED Final   Coronavirus OC43 NOT DETECTED NOT DETECTED Final   Metapneumovirus NOT DETECTED NOT  DETECTED Final   Rhinovirus / Enterovirus NOT DETECTED NOT DETECTED Final   Influenza A NOT DETECTED NOT DETECTED Final   Influenza B NOT DETECTED NOT DETECTED Final   Parainfluenza Virus 1 NOT DETECTED NOT DETECTED Final   Parainfluenza Virus 2 NOT DETECTED NOT DETECTED Final   Parainfluenza Virus 3 NOT DETECTED NOT DETECTED Final   Parainfluenza Virus 4 NOT DETECTED NOT DETECTED Final   Respiratory Syncytial Virus NOT DETECTED NOT DETECTED Final   Bordetella pertussis NOT DETECTED NOT DETECTED Final   Chlamydophila pneumoniae NOT DETECTED NOT DETECTED Final   Mycoplasma pneumoniae NOT DETECTED NOT DETECTED Final    Comment: Performed at [facility], [address], [place], [place] [address]    RADIOLOGY STUDIES/RESULTS: Dg Abd 1  View  Result Date: 04/02/2018 CLINICAL DATA:  OG tube placement. EXAM: ABDOMEN - 1 VIEW COMPARISON:  Chest x-ray 04/02/2018. FINDINGS: OG tube noted with tip over the stomach. No bowel distention. Stool noted throughout the colon. No free air. No acute bony abnormality. IMPRESSION: OG tube noted with tip over the stomach.  No acute abnormality. Electronically Signed   By: Maisie Fus  Register   On: 04/02/2018 10:35   Ct Head Wo Contrast  Result Date: 04/01/2018 CLINICAL DATA:  Post CPR EXAM: CT HEAD WITHOUT CONTRAST TECHNIQUE: Contiguous axial images were obtained from the base of the skull through the vertex without intravenous contrast. COMPARISON:  08/10/2015 FINDINGS: Brain: No evidence of acute infarction, hemorrhage, hydrocephalus, extra-axial collection or mass lesion/mass effect. Vascular: No hyperdense vessel or unexpected calcification. Skull: Normal. Negative for fracture or focal lesion. Sinuses/Orbits: No acute finding. Other: None. IMPRESSION: No acute intracranial pathology. Electronically Signed   By: Lauralyn Primes M.D.   On: 04/01/2018 10:51   US Renal  Result Date: 04/02/2018 CLINICAL DATA:  Acute kidney injury EXAM: RENAL / URINARY TRACT  ULTRASOUND COMPLETE COMPARISON:  None. FINDINGS: Right Kidney: Renal measurements: 11 x 4.3 x 4.7 cm = volume: 120.1 mL. Cortical echogenicity normal. No hydronephrosis. Possible scarring at the midpole. Left Kidney: Renal measurements: 10.3 x 3.7 x 5.7 cm = volume: 115.7 mL. Echogenicity within normal limits. No mass or hydronephrosis visualized. Bladder: Foley catheter in the bladder which is empty. Small amount of free fluid in the pelvis IMPRESSION: 1. Negative for hydronephrosis. There may be mild cortical scarring at the mid right kidney 2. Small amount of free fluid in the pelvis Electronically Signed   By: Jasmine Pang M.D.   On: 04/02/2018 19:46   Dg Chest Port 1 View  Result Date: 04/03/2018 CLINICAL DATA:  Endotracheal tube present. Acute respiratory failure with hypoxia. EXAM: PORTABLE CHEST 1 VIEW COMPARISON:  One-view chest x-ray 04/02/2018 FINDINGS: Heart size is normal. Endotracheal tube is in satisfactory position. NG tube courses off the inferior border of the film. Progressive right middle lobe airspace disease is present. Lung volumes are low. IMPRESSION: 1. Progressive right middle lobe airspace disease concerning for infection. 2. Support apparatus is stable. Electronically Signed   By: Marin Roberts M.D.   On: 04/03/2018 07:01   Dg Chest Port 1 View  Result Date: 04/02/2018 CLINICAL DATA:  Intubated. Acute respiratory failure. EXAM: PORTABLE CHEST 1 VIEW COMPARISON:  04/01/2018. FINDINGS: The endotracheal tube remains in satisfactory position. Nasogastric tube extending into the stomach with its side hole in the proximal stomach. Stable borderline enlarged cardiac silhouette and clear lungs. Unremarkable bones. IMPRESSION: Stable borderline cardiomegaly. No acute abnormalities. Electronically Signed   By: Beckie Salts M.D.   On: 04/02/2018 09:11   Dg Chest Portable 1 View  Result Date: 04/01/2018 CLINICAL DATA:  Status post intubation. EXAM: PORTABLE CHEST 1 VIEW  COMPARISON:  None. FINDINGS: The heart size and mediastinal contours are within normal limits. Both lungs are clear. Endotracheal tube is seen projected over tracheal air shadow with distal tip 5 cm above the carina. Distal tip of nasogastric tube is seen in proximal stomach. The visualized skeletal structures are unremarkable. IMPRESSION: Endotracheal tube in grossly good position. Distal tip of nasogastric tube in proximal stomach. No acute cardiopulmonary abnormality seen. Electronically Signed   By: Lupita Raider, M.D.   On: 04/01/2018 10:20     LOS: 5 days   Jeoffrey Massed, MD  [facility]  If 7PM-7AM, please contact  night-coverage  Please page via [email or web address]  Go to [email or web address] and use [facility] universal password to access. If you do not have the password, please contact the hospital operator.  Locate the [facility] provider you are looking for under [facility] and page to a number that you can be directly reached. If you still have difficulty reaching the provider, please page the [facility] (Director on Call) for the Hospitalists listed on amion for assistance.  04/06/2018, 10:39 AM

## 2018-04-07 DIAGNOSIS — E440 Moderate protein-calorie malnutrition: Secondary | ICD-10-CM

## 2018-04-07 DIAGNOSIS — N179 Acute kidney failure, unspecified: Secondary | ICD-10-CM

## 2018-04-07 LAB — COMPREHENSIVE METABOLIC PANEL
ALT: 1476 U/L — AB (ref 0–44)
AST: 171 U/L — AB (ref 15–41)
Albumin: 3.8 g/dL (ref 3.5–5.0)
Alkaline Phosphatase: 131 U/L — ABNORMAL HIGH (ref 38–126)
Anion gap: 13 (ref 5–15)
BUN: 60 mg/dL — ABNORMAL HIGH (ref 6–20)
CO2: 23 mmol/L (ref 22–32)
CREATININE: 2.44 mg/dL — AB (ref 0.61–1.24)
Calcium: 9.7 mg/dL (ref 8.9–10.3)
Chloride: 105 mmol/L (ref 98–111)
GFR calc Af Amer: 34 mL/min — ABNORMAL LOW (ref >60)
GFR calc non Af Amer: 30 mL/min — ABNORMAL LOW (ref >60)
GLUCOSE: 90 mg/dL (ref 70–99)
Potassium: 3.7 mmol/L (ref 3.5–5.1)
Sodium: 141 mmol/L (ref 135–145)
Total Bilirubin: 1 mg/dL (ref 0.3–1.2)
Total Protein: 7.1 g/dL (ref 6.5–8.1)

## 2018-04-07 LAB — GLUCOSE, CAPILLARY
GLUCOSE-CAPILLARY: 94 mg/dL (ref 70–99)
GLUCOSE-CAPILLARY: 120 mg/dL — AB (ref 70–99)
Glucose-Capillary: 109 mg/dL — ABNORMAL HIGH (ref 70–99)
Glucose-Capillary: 126 mg/dL — ABNORMAL HIGH (ref 70–99)
Glucose-Capillary: 144 mg/dL — ABNORMAL HIGH (ref 70–99)
Glucose-Capillary: 90 mg/dL (ref 70–99)

## 2018-04-07 LAB — MAGNESIUM: Magnesium: 2 mg/dL (ref 1.7–2.4)

## 2018-04-07 MED ORDER — CARVEDILOL 6.25 MG PO TABS
6.2500 mg | ORAL_TABLET | Freq: Two times a day (BID) | ORAL | Status: DC
  Administered 2018-04-07 – 2018-04-08 (×3): 6.25 mg via ORAL
  Filled 2018-04-07 (×3): qty 1

## 2018-04-07 NOTE — Progress Notes (Signed)
PROGRESS NOTE        PATIENT DETAILS Name: David Dennis Age: 53 y.o. Sex: male Date of Birth: April 10, 1965 Admit Date: 04/01/2018 Admitting Physician Chi Mechele Collin, MD ZOX:[id number], [facility]  Brief Narrative: Patient is a 53 y.o. male with history of depression, anxiety, hypertension, seizure disorder-into the emergency room unresponsive, pulseless, received 3 minutes of CPR with ROSC-admitted to the ICU intubated-further evaluation revealed AKI, shock liver and cardiomyopathy.  Now thought that this was probably secondary to a suicidal attempt with drug overdose.  See below for further details.  Subjective: No major issues overnight-inquiring about how long he needs to be in the hospital.  Denies any chest pain or shortness of breath.  Assessment/Plan: Acute hypoxic respiratory failure: Intubated in the emergency room due to failure to protect airway--subsequently extubated- 2/20-due to continued clinical improvement, has now been titrated to room air.  AKI: Likely ATN in the setting of cardiac arrest-creatinine continues to improve.  Continue to follow and monitor electrolytes.   Elevated transaminases secondary to shock liver: LFTs continue to downtrend, hepatitis serology was negative.  Follow LFTs periodically.    PEA arrest: Thought to be in the setting of presumed drug overdose.  Cardiology/CHF team recommending supportive care, once renal function improves further-cardiology will perform work-up in the future.  Acute systolic heart failure (EF 20-25%): Thought to be due to myocardial stunning secondary to cardiac arrest.  Volume status is stable.  Blood pressure on the higher side-seems to be tolerating low-dose Coreg-increase Coreg to 6.25 mg twice daily.  Continue to avoid ACEI/ARB due to renal function.  Per CHF team-they will perform further work-up in the future-once patient's renal function stabilizes further.   Fever likely  secondary to aspiration pneumonia: Does have a documented right lung infiltrate on chest x-ray on 2/21-sputum cultures positive for MSSA.  No longer on IV Rocephin-has been switched to Keflex-we will stop antimicrobial therapy in the next few days.  Hypertension: BP on the higher side-increasing Coreg-follow and optimize.   Seizure disorder: Continue Keppra  Drug overdose/suicidal attempt: Evaluated by psychiatry-recommendations are to transfer to inpatient psych.  Depression/anxiety: Appears depressed-but this morning claims his mood is stable.  Gabapentin 100 mg twice daily, and Seroquel as needed at bedtime.  See above regarding plans to transfer to inpatient psych when he is more stable  Moderate Malnutrition   DVT Prophylaxis: Prophylactic Heparin   Code Status: DNR  Family Communication: Mother at bedside  Disposition Plan: Remain inpatient-suspect in the next 1-2 days he will be medically stable for discharge to inpatient psych.  Antimicrobial agents: Anti-infectives (From admission, onward)   Start     Dose/Rate Route Frequency Ordered Stop   04/06/18 1100  cephALEXin (KEFLEX) capsule 500 mg     500 mg Oral Every 12 hours 04/06/18 1040     04/01/18 2200  cefTRIAXone (ROCEPHIN) 2 g in sodium chloride 0.9 % 100 mL IVPB  Status:  Discontinued     2 g 200 mL/hr over 30 Minutes Intravenous Every 24 hours 04/01/18 1851 04/06/18 1040      Procedures: None  CONSULTS:  cardiology, pulmonary/intensive care and psychiatry  Time spent: 25 minutes-Greater than 50% of this time was spent in counseling, explanation of diagnosis, planning of further management, and coordination of care.  MEDICATIONS: Scheduled Meds: . carvedilol  6.25 mg Oral BID  WC  . cephALEXin  500 mg Oral Q12H  . feeding supplement (ENSURE ENLIVE)  237 mL Oral BID BM  . folic acid  1 mg Oral QHS  . gabapentin  100 mg Oral BID  . heparin  5,000 Units Subcutaneous Q8H  . levETIRAcetam  500 mg Oral BID    . mouth rinse  15 mL Mouth Rinse BID  . multivitamin with minerals  1 tablet Oral Daily  . potassium chloride  40 mEq Oral Daily  . thiamine  100 mg Oral QHS   Continuous Infusions: . sodium chloride 10 mL/hr at 04/03/18 1800  . dextrose 10 mL/hr at 04/04/18 2022   PRN Meds:.hydrALAZINE, LORazepam, QUEtiapine   PHYSICAL EXAM: Vital signs: Vitals:   04/06/18 2111 04/07/18 0401 04/07/18 0500 04/07/18 0638  BP: (!) 152/106 (!) 148/105    Pulse: 69 73    Resp:  18    Temp:  98 F (36.7 C)    TempSrc:  Oral    SpO2:  92%    Weight:   71.9 kg 68.4 kg  Height:       Filed Weights   04/06/18 0647 04/07/18 0500 04/07/18 0638  Weight: 73 kg 71.9 kg 68.4 kg   Body mass index is 19.37 kg/m.   General appearance:Awake, alert, not in any distress.  Eyes:no scleral icterus. HEENT: Atraumatic and Normocephalic Neck: supple, no JVD. Resp:Good air entry bilaterally,no rales or rhonchi CVS: S1 S2 regular, no murmurs.  GI: Bowel sounds present, Non tender and not distended with no gaurding, rigidity or rebound. Extremities: B/L Lower Ext shows no edema, both legs are warm to touch Neurology:  Non focal Psychiatric: Normal judgment and insight. Normal mood. Musculoskeletal:No digital cyanosis Skin:No Rash, warm and dry Wounds:N/A  I have personally reviewed following labs and imaging studies  LABORATORY DATA: CBC: Recent Labs  Lab 04/02/18 0744 04/03/18 0715 04/03/18 1425 04/04/18 0538 04/05/18 0630 04/06/18 0412  WBC 15.3* 11.3*  --  10.8* 9.2 8.5  HGB 14.2 12.1*  --  10.4* 10.9* 11.4*  HCT 42.2 36.7*  --  30.0* 31.4* 33.1*  MCV 98.4 97.1  --  94.0 94.3 93.8  PLT 54* 77* 74* 65* 74* 82*    Basic Metabolic Panel: Recent Labs  Lab 04/01/18 2221 04/02/18 0308  04/03/18 0715  04/04/18 0538 04/04/18 1047 04/05/18 0223 04/05/18 0630 04/06/18 0412 04/07/18 0350  NA 145 143   < > 143  --  143  --   --  141 141 141  K 5.0 6.1*   < > 4.0   < > 3.9 3.9 3.5 3.6 3.1*  3.7  CL 112* 112*   < > 108  --  103  --   --  103 103 105  CO2 24 20*   < > 21*  --  23  --   --  21* 22 23  GLUCOSE 103* 93   < > 111*  --  74  --   --  87 85 90  BUN 35* 37*   < > 75*  --  91*  --   --  92* 75* 60*  CREATININE 3.13* 3.71*   < > 6.36*  --  7.04*  --   --  5.51* 3.63* 2.44*  CALCIUM 8.6* 8.5*   < > 8.3*  --  8.4*  --   --  8.9 9.1 9.7  MG 2.7* 2.9*  --  2.6*  --   --   --   --  2.4  --  2.0  PHOS  --  6.0*  --  4.5  --   --   --   --  5.0* 4.1  --    < > = values in this interval not displayed.    GFR: Estimated Creatinine Clearance: 34.7 mL/min (A) (by C-G formula based on SCr of 2.44 mg/dL (H)).  Liver Function Tests: Recent Labs  Lab 04/03/18 0715 04/04/18 0538 04/05/18 0630 04/06/18 0412 04/07/18 0350  AST 6,128* 1,759* 788* 375* 171*  ALT 5,392* 3,630* 2,879* 1,993* 1,476*  ALKPHOS 66 64 98 109 131*  BILITOT 0.6 0.4 0.7 0.8 1.0  PROT 5.4* 5.5* 6.5 6.6 7.1  ALBUMIN 3.0* 2.9* 3.4* 3.4*  3.4* 3.8   No results for input(s): LIPASE, AMYLASE in the last 168 hours. No results for input(s): AMMONIA in the last 168 hours.  Coagulation Profile: Recent Labs  Lab 04/03/18 1425 04/04/18 0538 04/05/18 0630  INR 1.69 1.70 1.46    Cardiac Enzymes: Recent Labs  Lab 04/01/18 2103 04/02/18 0950 04/02/18 1112 04/02/18 1540 04/02/18 2301  CKTOTAL  --  738*  --   --   --   TROPONINI <0.03 QUANTITY NOT SUFFICIENT, UNABLE TO PERFORM TEST 0.10* 0.14* 0.13*    BNP (last 3 results) No results for input(s): PROBNP in the last 8760 hours.  HbA1C: No results for input(s): HGBA1C in the last 72 hours.  CBG: Recent Labs  Lab 04/06/18 1657 04/06/18 1956 04/07/18 0005 04/07/18 0358 04/07/18 0812  GLUCAP 112* 94 90 94 120*    Lipid Profile: No results for input(s): CHOL, HDL, LDLCALC, TRIG, CHOLHDL, LDLDIRECT in the last 72 hours.  Thyroid Function Tests: No results for input(s): TSH, T4TOTAL, FREET4, T3FREE, THYROIDAB in the last 72 hours.  Anemia  Panel: No results for input(s): VITAMINB12, FOLATE, FERRITIN, TIBC, IRON, RETICCTPCT in the last 72 hours.  Urine analysis:    Component Value Date/Time   COLORURINE YELLOW 04/02/2018 2054   APPEARANCEUR CLOUDY (A) 04/02/2018 2054   LABSPEC 1.013 04/02/2018 2054   PHURINE 5.0 04/02/2018 2054   GLUCOSEU NEGATIVE 04/02/2018 2054   HGBUR SMALL (A) 04/02/2018 2054   BILIRUBINUR NEGATIVE 04/02/2018 2054   KETONESUR NEGATIVE 04/02/2018 2054   PROTEINUR 30 (A) 04/02/2018 2054   NITRITE NEGATIVE 04/02/2018 2054   LEUKOCYTESUR TRACE (A) 04/02/2018 2054    Sepsis Labs: Lactic Acid, Venous    Component Value Date/Time   LATICACIDVEN 2.7 (HH) 04/02/2018 1540    MICROBIOLOGY: Recent Results (from the past 240 hour(s))  Culture, blood (routine x 2)     Status: None   Collection Time: 04/01/18 12:00 PM  Result Value Ref Range Status   Specimen Description BLOOD RIGHT HAND  Final   Special Requests   Final    BOTTLES DRAWN AEROBIC AND ANAEROBIC Blood Culture results may not be optimal due to an inadequate volume of blood received in culture bottles   Culture   Final    NO GROWTH 5 DAYS Performed at [facility], [address], [place], [place] [address]    Report Status 04/06/2018 FINAL  Final  Culture, blood (routine x 2)     Status: None   Collection Time: 04/01/18 12:07 PM  Result Value Ref Range Status   Specimen Description BLOOD RIGHT FOREARM  Final   Special Requests   Final    BOTTLES DRAWN AEROBIC AND ANAEROBIC Blood Culture adequate volume   Culture   Final    NO GROWTH 5  DAYS Performed at [facility], [address], [place], [place] [address]    Report Status 04/06/2018 FINAL  Final  Urine culture     Status: None   Collection Time: 04/01/18  1:01 PM  Result Value Ref Range Status   Specimen Description URINE, CATHETERIZED  Final   Special Requests NONE  Final   Culture   Final    NO GROWTH Performed at [facility], [address],  [place], [place] [address]    Report Status 04/02/2018 FINAL  Final  MRSA PCR Screening     Status: None   Collection Time: 04/01/18  5:36 PM  Result Value Ref Range Status   MRSA by PCR NEGATIVE NEGATIVE Final    Comment:        The GeneXpert MRSA Assay (FDA approved for NASAL specimens only), is one component of a comprehensive MRSA colonization surveillance program. It is not intended to diagnose MRSA infection nor to guide or monitor treatment for MRSA infections. Performed at [facility], [address], [place], [place] [address]   Culture, respiratory (tracheal aspirate)     Status: None   Collection Time: 04/02/18 12:07 AM  Result Value Ref Range Status   Specimen Description TRACHEAL ASPIRATE  Final   Special Requests NONE  Final   Gram Stain   Final    RARE WBC PRESENT, PREDOMINANTLY PMN ABUNDANT GRAM POSITIVE COCCI Performed at [facility], [address] [place], [place] [address]    Culture MODERATE STAPHYLOCOCCUS AUREUS  Final   Report Status 04/05/2018 FINAL  Final   Organism ID, Bacteria STAPHYLOCOCCUS AUREUS  Final      Susceptibility   Staphylococcus aureus - MIC*    CIPROFLOXACIN <=0.5 SENSITIVE Sensitive     ERYTHROMYCIN RESISTANT Resistant     GENTAMICIN <=0.5 SENSITIVE Sensitive     OXACILLIN 0.5 SENSITIVE Sensitive     TETRACYCLINE <=1 SENSITIVE Sensitive     VANCOMYCIN <=0.5 SENSITIVE Sensitive     TRIMETH/SULFA <=10 SENSITIVE Sensitive     CLINDAMYCIN RESISTANT Resistant     RIFAMPIN <=0.5 SENSITIVE Sensitive     Inducible Clindamycin POSITIVE Resistant     * MODERATE STAPHYLOCOCCUS AUREUS  Respiratory Panel by PCR     Status: None   Collection Time: 04/02/18  5:29 PM  Result Value Ref Range Status   Adenovirus NOT DETECTED NOT DETECTED Final   Coronavirus 229E NOT DETECTED NOT DETECTED Final    Comment: (NOTE) The Coronavirus on the Respiratory Panel, DOES NOT test for the novel  Coronavirus (2019 nCoV)    Coronavirus HKU1 NOT  DETECTED NOT DETECTED Final   Coronavirus NL63 NOT DETECTED NOT DETECTED Final   Coronavirus OC43 NOT DETECTED NOT DETECTED Final   Metapneumovirus NOT DETECTED NOT DETECTED Final   Rhinovirus / Enterovirus NOT DETECTED NOT DETECTED Final   Influenza A NOT DETECTED NOT DETECTED Final   Influenza B NOT DETECTED NOT DETECTED Final   Parainfluenza Virus 1 NOT DETECTED NOT DETECTED Final   Parainfluenza Virus 2 NOT DETECTED NOT DETECTED Final   Parainfluenza Virus 3 NOT DETECTED NOT DETECTED Final   Parainfluenza Virus 4 NOT DETECTED NOT DETECTED Final   Respiratory Syncytial Virus NOT DETECTED NOT DETECTED Final   Bordetella pertussis NOT DETECTED NOT DETECTED Final   Chlamydophila pneumoniae NOT DETECTED NOT DETECTED Final   Mycoplasma pneumoniae NOT DETECTED NOT DETECTED Final    Comment: Performed at [facility], [address], [place], [place] [address]    RADIOLOGY STUDIES/RESULTS: Dg Abd 1 View  Result Date: 04/02/2018 CLINICAL DATA:  OG tube placement. EXAM: ABDOMEN - 1 VIEW COMPARISON:  Chest x-ray 04/02/2018. FINDINGS: OG tube noted with tip over the stomach. No bowel distention. Stool noted throughout the colon. No free air. No acute bony abnormality. IMPRESSION: OG tube noted with tip over the stomach.  No acute abnormality. Electronically Signed   By: Maisie Fus  Register   On: 04/02/2018 10:35   Ct Head Wo Contrast  Result Date: 04/01/2018 CLINICAL DATA:  Post CPR EXAM: CT HEAD WITHOUT CONTRAST TECHNIQUE: Contiguous axial images were obtained from the base of the skull through the vertex without intravenous contrast. COMPARISON:  08/10/2015 FINDINGS: Brain: No evidence of acute infarction, hemorrhage, hydrocephalus, extra-axial collection or mass lesion/mass effect. Vascular: No hyperdense vessel or unexpected calcification. Skull: Normal. Negative for fracture or focal lesion. Sinuses/Orbits: No acute finding. Other: None. IMPRESSION: No acute intracranial pathology.  Electronically Signed   By: Lauralyn Primes M.D.   On: 04/01/2018 10:51   US Renal  Result Date: 04/02/2018 CLINICAL DATA:  Acute kidney injury EXAM: RENAL / URINARY TRACT ULTRASOUND COMPLETE COMPARISON:  None. FINDINGS: Right Kidney: Renal measurements: 11 x 4.3 x 4.7 cm = volume: 120.1 mL. Cortical echogenicity normal. No hydronephrosis. Possible scarring at the midpole. Left Kidney: Renal measurements: 10.3 x 3.7 x 5.7 cm = volume: 115.7 mL. Echogenicity within normal limits. No mass or hydronephrosis visualized. Bladder: Foley catheter in the bladder which is empty. Small amount of free fluid in the pelvis IMPRESSION: 1. Negative for hydronephrosis. There may be mild cortical scarring at the mid right kidney 2. Small amount of free fluid in the pelvis Electronically Signed   By: Jasmine Pang M.D.   On: 04/02/2018 19:46   Dg Chest Port 1 View  Result Date: 04/03/2018 CLINICAL DATA:  Endotracheal tube present. Acute respiratory failure with hypoxia. EXAM: PORTABLE CHEST 1 VIEW COMPARISON:  One-view chest x-ray 04/02/2018 FINDINGS: Heart size is normal. Endotracheal tube is in satisfactory position. NG tube courses off the inferior border of the film. Progressive right middle lobe airspace disease is present. Lung volumes are low. IMPRESSION: 1. Progressive right middle lobe airspace disease concerning for infection. 2. Support apparatus is stable. Electronically Signed   By: Marin Roberts M.D.   On: 04/03/2018 07:01   Dg Chest Port 1 View  Result Date: 04/02/2018 CLINICAL DATA:  Intubated. Acute respiratory failure. EXAM: PORTABLE CHEST 1 VIEW COMPARISON:  04/01/2018. FINDINGS: The endotracheal tube remains in satisfactory position. Nasogastric tube extending into the stomach with its side hole in the proximal stomach. Stable borderline enlarged cardiac silhouette and clear lungs. Unremarkable bones. IMPRESSION: Stable borderline cardiomegaly. No acute abnormalities. Electronically Signed   By:  Beckie Salts M.D.   On: 04/02/2018 09:11   Dg Chest Portable 1 View  Result Date: 04/01/2018 CLINICAL DATA:  Status post intubation. EXAM: PORTABLE CHEST 1 VIEW COMPARISON:  None. FINDINGS: The heart size and mediastinal contours are within normal limits. Both lungs are clear. Endotracheal tube is seen projected over tracheal air shadow with distal tip 5 cm above the carina. Distal tip of nasogastric tube is seen in proximal stomach. The visualized skeletal structures are unremarkable. IMPRESSION: Endotracheal tube in grossly good position. Distal tip of nasogastric tube in proximal stomach. No acute cardiopulmonary abnormality seen. Electronically Signed   By: Lupita Raider, M.D.   On: 04/01/2018 10:20     LOS: 6 days  Jeoffrey Massed, MD  [facility]  If 7PM-7AM, please contact night-coverage  Please page via [email or web address]  Go to [email or web address] and use [facility]'s universal password to access. If you do not have the password, please contact the hospital operator.  Locate the [facility] provider you are looking for under [facility] and page to a number that you can be directly reached. If you still have difficulty reaching the provider, please page the [facility] (Director on Call) for the Hospitalists listed on amion for assistance.  04/07/2018, 9:18 AM

## 2018-04-08 ENCOUNTER — Other Ambulatory Visit: Payer: Self-pay

## 2018-04-08 ENCOUNTER — Other Ambulatory Visit: Payer: Self-pay | Admitting: Registered Nurse

## 2018-04-08 ENCOUNTER — Encounter (HOSPITAL_COMMUNITY): Payer: Self-pay

## 2018-04-08 ENCOUNTER — Inpatient Hospital Stay (HOSPITAL_COMMUNITY)
Admission: AD | Admit: 2018-04-08 | Discharge: 2018-04-21 | DRG: 885 | Disposition: A | Discharge status: Home / Self Care | Payer: Federal, State, Local not specified - Other | Source: Intra-hospital | Attending: Psychiatry | Admitting: Psychiatry

## 2018-04-08 DIAGNOSIS — Z79899 Other long term (current) drug therapy: Secondary | ICD-10-CM

## 2018-04-08 DIAGNOSIS — F322 Major depressive disorder, single episode, severe without psychotic features: Secondary | ICD-10-CM | POA: Diagnosis present

## 2018-04-08 DIAGNOSIS — T50901A Poisoning by unspecified drugs, medicaments and biological substances, accidental (unintentional), initial encounter: Secondary | ICD-10-CM | POA: Diagnosis present

## 2018-04-08 DIAGNOSIS — G4700 Insomnia, unspecified: Secondary | ICD-10-CM | POA: Diagnosis present

## 2018-04-08 DIAGNOSIS — F4010 Social phobia, unspecified: Secondary | ICD-10-CM | POA: Diagnosis present

## 2018-04-08 DIAGNOSIS — F200 Paranoid schizophrenia: Secondary | ICD-10-CM | POA: Diagnosis present

## 2018-04-08 DIAGNOSIS — Z915 Personal history of self-harm: Secondary | ICD-10-CM | POA: Diagnosis not present

## 2018-04-08 DIAGNOSIS — F411 Generalized anxiety disorder: Secondary | ICD-10-CM | POA: Diagnosis present

## 2018-04-08 DIAGNOSIS — Z7289 Other problems related to lifestyle: Secondary | ICD-10-CM

## 2018-04-08 DIAGNOSIS — N179 Acute kidney failure, unspecified: Secondary | ICD-10-CM | POA: Diagnosis present

## 2018-04-08 DIAGNOSIS — F17210 Nicotine dependence, cigarettes, uncomplicated: Secondary | ICD-10-CM | POA: Diagnosis present

## 2018-04-08 DIAGNOSIS — I10 Essential (primary) hypertension: Secondary | ICD-10-CM | POA: Diagnosis present

## 2018-04-08 LAB — COMPREHENSIVE METABOLIC PANEL
ALBUMIN: 3.4 g/dL — AB (ref 3.5–5.0)
ALT: 881 U/L — ABNORMAL HIGH (ref 0–44)
AST: 71 U/L — ABNORMAL HIGH (ref 15–41)
Alkaline Phosphatase: 112 U/L (ref 38–126)
Anion gap: 14 (ref 5–15)
BUN: 53 mg/dL — ABNORMAL HIGH (ref 6–20)
CO2: 21 mmol/L — ABNORMAL LOW (ref 22–32)
Calcium: 9 mg/dL (ref 8.9–10.3)
Chloride: 104 mmol/L (ref 98–111)
Creatinine, Ser: 1.88 mg/dL — ABNORMAL HIGH (ref 0.61–1.24)
GFR calc Af Amer: 47 mL/min — ABNORMAL LOW (ref >60)
GFR calc non Af Amer: 40 mL/min — ABNORMAL LOW (ref >60)
GLUCOSE: 93 mg/dL (ref 70–99)
Potassium: 3.7 mmol/L (ref 3.5–5.1)
Sodium: 139 mmol/L (ref 135–145)
Total Bilirubin: 1 mg/dL (ref 0.3–1.2)
Total Protein: 6.5 g/dL (ref 6.5–8.1)

## 2018-04-08 LAB — CBC
HCT: 33.6 % — ABNORMAL LOW (ref 39.0–52.0)
Hemoglobin: 11.8 g/dL — ABNORMAL LOW (ref 13.0–17.0)
MCH: 33.1 pg (ref 26.0–34.0)
MCHC: 35.1 g/dL (ref 30.0–36.0)
MCV: 94.1 fL (ref 80.0–100.0)
Platelets: 86 10*3/uL — ABNORMAL LOW (ref 150–400)
RBC: 3.57 MIL/uL — ABNORMAL LOW (ref 4.22–5.81)
RDW: 13.2 % (ref 11.5–15.5)
WBC: 10.7 10*3/uL — ABNORMAL HIGH (ref 4.0–10.5)
nRBC: 0 % (ref 0.0–0.2)

## 2018-04-08 LAB — GLUCOSE, CAPILLARY
GLUCOSE-CAPILLARY: 101 mg/dL — AB (ref 70–99)
Glucose-Capillary: 114 mg/dL — ABNORMAL HIGH (ref 70–99)
Glucose-Capillary: 122 mg/dL — ABNORMAL HIGH (ref 70–99)
Glucose-Capillary: 90 mg/dL (ref 70–99)

## 2018-04-08 MED ORDER — CARVEDILOL 6.25 MG PO TABS: 6.2500 mg | ORAL_TABLET | Freq: Two times a day (BID) | ORAL | Status: DC

## 2018-04-08 MED ORDER — THIAMINE HCL 100 MG PO TABS: 100.0000 mg | ORAL_TABLET | Freq: Every day | ORAL | Status: DC

## 2018-04-08 MED ORDER — QUETIAPINE FUMARATE 25 MG PO TABS: 25.0000 mg | ORAL_TABLET | Freq: Every evening | ORAL | Status: DC | PRN

## 2018-04-08 MED ORDER — GABAPENTIN 100 MG PO CAPS: 100.0000 mg | ORAL_CAPSULE | Freq: Two times a day (BID) | ORAL | Status: DC

## 2018-04-08 MED ORDER — LEVETIRACETAM 500 MG PO TABS: 500.0000 mg | ORAL_TABLET | Freq: Two times a day (BID) | ORAL | 0 refills | Status: DC

## 2018-04-08 MED ORDER — FOLIC ACID 1 MG PO TABS: 1.0000 mg | ORAL_TABLET | Freq: Every day | ORAL | Status: DC

## 2018-04-08 MED ORDER — QUETIAPINE FUMARATE 25 MG PO TABS: 25.0000 mg | ORAL_TABLET | Freq: Every day | ORAL | Status: DC | PRN

## 2018-04-08 MED ORDER — HYDROXYZINE HCL 25 MG PO TABS
25.0000 mg | ORAL_TABLET | Freq: Four times a day (QID) | ORAL | Status: DC | PRN
  Administered 2018-04-09 – 2018-04-21 (×30): 25 mg via ORAL
  Filled 2018-04-08: qty 1
  Filled 2018-04-08: qty 4
  Filled 2018-04-08 (×4): qty 1
  Filled 2018-04-08: qty 28
  Filled 2018-04-08 (×24): qty 1

## 2018-04-08 MED ORDER — ENSURE ENLIVE PO LIQD: 237.0000 mL | Freq: Two times a day (BID) | ORAL | 12 refills | Status: DC

## 2018-04-08 NOTE — Progress Notes (Signed)
Patient described his day as having been "pretty bad". He complained of sleeping poorly last night and for dealing with anxiety when he is around large numbers of people. His goal for tomorrow is to try to make more progress. He admits to isolating himself prior to admission and being lonely. He is agreeable to trying to spend more time in the dayroom.

## 2018-04-08 NOTE — Progress Notes (Signed)
D: Pt was in hallway upon initial approach.  Pt presents with anxious, depressed affect and mood.  He reports he is "all right, stressing out a little bit."  His goal is to "go over the paperwork" the nurse gave him earlier.  Pt denies SI/HI, denies hallucinations, denies pain.  Pt has been visible in milieu interacting with peers and staff cautiously.  Pt attended evening group.    A: Introduced self to pt.  Met with pt 1:1.  Actively listened to pt and offered support and encouragement. On-site provider contacted for medication orders and new orders were placed.  Q15 minute safety checks maintained.  R: Pt is safe on the unit.  Pt reports he wants to "take as little medications as possible."  Pt verbally contracts for safety.  Will continue to monitor and assess.

## 2018-04-08 NOTE — Progress Notes (Addendum)
PROGRESS NOTE        PATIENT DETAILS Name: David Dennis Age: 53 y.o. Sex: male Date of Birth: Apr 10, 1965 Admit Date: 04/01/2018 Admitting Physician Chi Mechele Collin, MD [id number], [facility]  Brief Narrative: Patient is a 53 y.o. male with history of depression, anxiety, hypertension, seizure disorder-into the emergency room unresponsive, pulseless, received 3 minutes of CPR with ROSC-admitted to the ICU intubated-further evaluation revealed AKI, shock liver and cardiomyopathy.  Now thought that this was probably secondary to a suicidal attempt with drug overdose.  See below for further details.  Subjective: Lying comfortably in bed-claims he ambulated around the hallway multiple times yesterday without any shortness of breath.  Anxious to get to inpatient psych to begin treatment.  Assessment/Plan: Acute hypoxic respiratory failure: Intubated in the emergency room due to failure to protect airway--subsequently extubated- 2/20-due to continued clinical improvement, has now been titrated to room air.  AKI: Likely ATN in the setting of cardiac arrest-creatinine tenuous to improve rapidly with just supportive care-he is not on IV fluids.  Continue to follow electrolytes-suspect creatinine will normalize in the next few days.  Since numbers improving spontaneously-I suspect he is stable to be transferred to inpatient psych if bed available.  Elevated transaminases secondary to shock liver: LFTs continue to downtrend with just supportive care, hepatitis serology was negative.  Follow LFTs periodically-this should normalize over the next few days.  Since numbers improving spontaneously-I suspect he is stable to be transferred to inpatient psych if bed available.  PEA arrest: Thought to be in the setting of presumed drug overdose.  Cardiology/CHF team recommending supportive care, once renal function improves further-cardiology will perform work-up in the  future.  Acute systolic heart failure (EF 20-25%): Thought to be due to myocardial stunning secondary to cardiac arrest.  Volume status is stable.  Blood pressure on the higher side-seems to be tolerating Coreg.  Continue to avoid ACEI/ARB due to renal function-but suspect that we can probably initiate these agents in the next few days..  Per CHF team-they will perform further work-up in the future-once patient's renal function stabilizes further.   Fever likely secondary to aspiration pneumonia: Does have a documented right lung infiltrate on chest x-ray on 2/21-sputum cultures positive for MSSA.  Patiently on IV Rocephin-switch to Keflex-has completed more than 7 days of treatment.  Stop all antimicrobial therapy today.    Hypertension: BP slightly on the higher side but Coreg just increased on 2/25-follow for now.  Seizure disorder: Continue Keppra  Drug overdose/suicidal attempt: Evaluated by psychiatry-recommendations are to transfer to inpatient psych.  Depression/anxiety: Appears depressed-but this morning claims his mood is stable.  Gabapentin 100 mg twice daily, and Seroquel as needed at bedtime.  See above regarding plans to transfer to inpatient psych when he is more stable  Moderate Malnutrition   DVT Prophylaxis: Prophylactic Heparin   Code Status: DNR  Family Communication: Mother at bedside  Disposition Plan: Remain inpatient-renal and liver function improving rapidly daily-these numbers are improving on their own-I suspect he is stable to be discharged to inpatient psych if bed available.  Antimicrobial agents: Anti-infectives (From admission, onward)   Start     Dose/Rate Route Frequency Ordered Stop   04/06/18 1100  cephALEXin (KEFLEX) capsule 500 mg     500 mg Oral Every 12 hours 04/06/18 1040     04/01/18  2200  cefTRIAXone (ROCEPHIN) 2 g in sodium chloride 0.9 % 100 mL IVPB  Status:  Discontinued     2 g 200 mL/hr over 30 Minutes Intravenous Every 24 hours  04/01/18 1851 04/06/18 1040      Procedures: None  CONSULTS:  cardiology, pulmonary/intensive care and psychiatry  Time spent: 25 minutes-Greater than 50% of this time was spent in counseling, explanation of diagnosis, planning of further management, and coordination of care.  MEDICATIONS: Scheduled Meds: . carvedilol  6.25 mg Oral BID WC  . cephALEXin  500 mg Oral Q12H  . feeding supplement (ENSURE ENLIVE)  237 mL Oral BID BM  . folic acid  1 mg Oral QHS  . gabapentin  100 mg Oral BID  . heparin  5,000 Units Subcutaneous Q8H  . levETIRAcetam  500 mg Oral BID  . mouth rinse  15 mL Mouth Rinse BID  . multivitamin with minerals  1 tablet Oral Daily  . potassium chloride  40 mEq Oral Daily  . thiamine  100 mg Oral QHS   Continuous Infusions: . dextrose 10 mL/hr at 04/04/18 2022   PRN Meds:.hydrALAZINE, LORazepam, QUEtiapine   PHYSICAL EXAM: Vital signs: Vitals:   04/07/18 1919 04/07/18 2027 04/08/18 0410 04/08/18 0500  BP:  (!) 156/104 (!) 154/108   Pulse:  64 75   Resp:  18 18   Temp:  99.1 F (37.3 C) 98.5 F (36.9 C)   TempSrc:  Oral Oral   SpO2:  99% 99%   Weight: 68.8 kg   67.8 kg  Height:       Filed Weights   04/07/18 0638 04/07/18 1919 04/08/18 0500  Weight: 68.4 kg 68.8 kg 67.8 kg   Body mass index is 19.18 kg/m.   General appearance:Awake, alert, not in any distress.  Eyes:no scleral icterus. HEENT: Atraumatic and Normocephalic Neck: supple, no JVD. Resp:Good air entry bilaterally,no rales or rhonchi CVS: S1 S2 regular, no murmurs.  GI: Bowel sounds present, Non tender and not distended with no gaurding, rigidity or rebound. Extremities: B/L Lower Ext shows no edema, both legs are warm to touch Neurology:  Non focal Psychiatric: Normal judgment and insight. Normal mood. Musculoskeletal:No digital cyanosis Skin:No Rash, warm and dry Wounds:N/A  I have personally reviewed following labs and imaging studies  LABORATORY DATA: CBC: Recent  Labs  Lab 04/03/18 0715 04/03/18 1425 04/04/18 0538 04/05/18 0630 04/06/18 0412 04/08/18 0332  WBC 11.3*  --  10.8* 9.2 8.5 10.7*  HGB 12.1*  --  10.4* 10.9* 11.4* 11.8*  HCT 36.7*  --  30.0* 31.4* 33.1* 33.6*  MCV 97.1  --  94.0 94.3 93.8 94.1  PLT 77* 74* 65* 74* 82* 86*    Basic Metabolic Panel: Recent Labs  Lab 04/01/18 2221 04/02/18 0308  04/03/18 0715  04/04/18 0538  04/05/18 0223 04/05/18 0630 04/06/18 0412 04/07/18 0350 04/08/18 0332  NA 145 143   < > 143  --  143  --   --  141 141 141 139  K 5.0 6.1*   < > 4.0   < > 3.9   < > 3.5 3.6 3.1* 3.7 3.7  CL 112* 112*   < > 108  --  103  --   --  103 103 105 104  CO2 24 20*   < > 21*  --  23  --   --  21* 22 23 21*  GLUCOSE 103* 93   < > 111*  --  74  --   --  87 85 90 93  BUN 35* 37*   < > 75*  --  91*  --   --  92* 75* 60* 53*  CREATININE 3.13* 3.71*   < > 6.36*  --  7.04*  --   --  5.51* 3.63* 2.44* 1.88*  CALCIUM 8.6* 8.5*   < > 8.3*  --  8.4*  --   --  8.9 9.1 9.7 9.0  MG 2.7* 2.9*  --  2.6*  --   --   --   --  2.4  --  2.0  --   PHOS  --  6.0*  --  4.5  --   --   --   --  5.0* 4.1  --   --    < > = values in this interval not displayed.    GFR: Estimated Creatinine Clearance: 44.6 mL/min (A) (by C-G formula based on SCr of 1.88 mg/dL (H)).  Liver Function Tests: Recent Labs  Lab 04/04/18 0538 04/05/18 0630 04/06/18 0412 04/07/18 0350 04/08/18 0332  AST 1,759* 788* 375* 171* 71*  ALT 3,630* 2,879* 1,993* 1,476* 881*  ALKPHOS 64 98 109 131* 112  BILITOT 0.4 0.7 0.8 1.0 1.0  PROT 5.5* 6.5 6.6 7.1 6.5  ALBUMIN 2.9* 3.4* 3.4*  3.4* 3.8 3.4*   No results for input(s): LIPASE, AMYLASE in the last 168 hours. No results for input(s): AMMONIA in the last 168 hours.  Coagulation Profile: Recent Labs  Lab 04/03/18 1425 04/04/18 0538 04/05/18 0630  INR 1.69 1.70 1.46    Cardiac Enzymes: Recent Labs  Lab 04/01/18 2103 04/02/18 0950 04/02/18 1112 04/02/18 1540 04/02/18 2301  CKTOTAL  --  738*  --    --   --   TROPONINI <0.03 QUANTITY NOT SUFFICIENT, UNABLE TO PERFORM TEST 0.10* 0.14* 0.13*    BNP (last 3 results) No results for input(s): PROBNP in the last 8760 hours.  HbA1C: No results for input(s): HGBA1C in the last 72 hours.  CBG: Recent Labs  Lab 04/07/18 1657 04/07/18 2023 04/08/18 0008 04/08/18 0403 04/08/18 0805  GLUCAP 109* 126* 90 101* 122*    Lipid Profile: No results for input(s): CHOL, HDL, LDLCALC, TRIG, CHOLHDL, LDLDIRECT in the last 72 hours.  Thyroid Function Tests: No results for input(s): TSH, T4TOTAL, FREET4, T3FREE, THYROIDAB in the last 72 hours.  Anemia Panel: No results for input(s): VITAMINB12, FOLATE, FERRITIN, TIBC, IRON, RETICCTPCT in the last 72 hours.  Urine analysis:    Component Value Date/Time   COLORURINE YELLOW 04/02/2018 2054   APPEARANCEUR CLOUDY (A) 04/02/2018 2054   LABSPEC 1.013 04/02/2018 2054   PHURINE 5.0 04/02/2018 2054   GLUCOSEU NEGATIVE 04/02/2018 2054   HGBUR SMALL (A) 04/02/2018 2054   BILIRUBINUR NEGATIVE 04/02/2018 2054   KETONESUR NEGATIVE 04/02/2018 2054   PROTEINUR 30 (A) 04/02/2018 2054   NITRITE NEGATIVE 04/02/2018 2054   LEUKOCYTESUR TRACE (A) 04/02/2018 2054    Sepsis Labs: Lactic Acid, Venous    Component Value Date/Time   LATICACIDVEN 2.7 (HH) 04/02/2018 1540    MICROBIOLOGY: Recent Results (from the past 240 hour(s))  Culture, blood (routine x 2)     Status: None   Collection Time: 04/01/18 12:00 PM  Result Value Ref Range Status   Specimen Description BLOOD RIGHT HAND  Final   Special Requests   Final    BOTTLES DRAWN AEROBIC AND ANAEROBIC Blood Culture results may not be optimal due to an inadequate volume of blood received in culture bottles  Culture   Final    NO GROWTH 5 DAYS Performed at [facility], [address] [place], [place] [address]    Report Status 04/06/2018 FINAL  Final  Culture, blood (routine x 2)     Status: None   Collection Time: 04/01/18 12:07 PM    Result Value Ref Range Status   Specimen Description BLOOD RIGHT FOREARM  Final   Special Requests   Final    BOTTLES DRAWN AEROBIC AND ANAEROBIC Blood Culture adequate volume   Culture   Final    NO GROWTH 5 DAYS Performed at [facility], [address] [place], [place] [address]    Report Status 04/06/2018 FINAL  Final  Urine culture     Status: None   Collection Time: 04/01/18  1:01 PM  Result Value Ref Range Status   Specimen Description URINE, CATHETERIZED  Final   Special Requests NONE  Final   Culture   Final    NO GROWTH Performed at [facility], [address] [place], [place] [address]    Report Status 04/02/2018 FINAL  Final  MRSA PCR Screening     Status: None   Collection Time: 04/01/18  5:36 PM  Result Value Ref Range Status   MRSA by PCR NEGATIVE NEGATIVE Final    Comment:        The GeneXpert MRSA Assay (FDA approved for NASAL specimens only), is one component of a comprehensive MRSA colonization surveillance program. It is not intended to diagnose MRSA infection nor to guide or monitor treatment for MRSA infections. Performed at [facility], [address] [place], [place] [address]   Culture, respiratory (tracheal aspirate)     Status: None   Collection Time: 04/02/18 12:07 AM  Result Value Ref Range Status   Specimen Description TRACHEAL ASPIRATE  Final   Special Requests NONE  Final   Gram Stain   Final    RARE WBC PRESENT, PREDOMINANTLY PMN ABUNDANT GRAM POSITIVE COCCI Performed at [facility], [address] [place], [place] [address]    Culture MODERATE STAPHYLOCOCCUS AUREUS  Final   Report Status 04/05/2018 FINAL  Final   Organism ID, Bacteria STAPHYLOCOCCUS AUREUS  Final      Susceptibility   Staphylococcus aureus - MIC*    CIPROFLOXACIN <=0.5 SENSITIVE Sensitive     ERYTHROMYCIN RESISTANT Resistant     GENTAMICIN <=0.5 SENSITIVE Sensitive     OXACILLIN 0.5 SENSITIVE Sensitive     TETRACYCLINE <=1  SENSITIVE Sensitive     VANCOMYCIN <=0.5 SENSITIVE Sensitive     TRIMETH/SULFA <=10 SENSITIVE Sensitive     CLINDAMYCIN RESISTANT Resistant     RIFAMPIN <=0.5 SENSITIVE Sensitive     Inducible Clindamycin POSITIVE Resistant     * MODERATE STAPHYLOCOCCUS AUREUS  Respiratory Panel by PCR     Status: None   Collection Time: 04/02/18  5:29 PM  Result Value Ref Range Status   Adenovirus NOT DETECTED NOT DETECTED Final   Coronavirus 229E NOT DETECTED NOT DETECTED Final    Comment: (NOTE) The Coronavirus on the Respiratory Panel, DOES NOT test for the novel  Coronavirus (2019 nCoV)    Coronavirus HKU1 NOT DETECTED NOT DETECTED Final   Coronavirus NL63 NOT DETECTED NOT DETECTED Final   Coronavirus OC43 NOT DETECTED NOT DETECTED Final   Metapneumovirus NOT DETECTED NOT DETECTED Final   Rhinovirus / Enterovirus NOT DETECTED NOT DETECTED Final   Influenza A NOT DETECTED NOT DETECTED  Final   Influenza B NOT DETECTED NOT DETECTED Final   Parainfluenza Virus 1 NOT DETECTED NOT DETECTED Final   Parainfluenza Virus 2 NOT DETECTED NOT DETECTED Final   Parainfluenza Virus 3 NOT DETECTED NOT DETECTED Final   Parainfluenza Virus 4 NOT DETECTED NOT DETECTED Final   Respiratory Syncytial Virus NOT DETECTED NOT DETECTED Final   Bordetella pertussis NOT DETECTED NOT DETECTED Final   Chlamydophila pneumoniae NOT DETECTED NOT DETECTED Final   Mycoplasma pneumoniae NOT DETECTED NOT DETECTED Final    Comment: Performed at [facility], [address], [place], [place] [address]    RADIOLOGY STUDIES/RESULTS: Dg Abd 1 View  Result Date: 04/02/2018 CLINICAL DATA:  OG tube placement. EXAM: ABDOMEN - 1 VIEW COMPARISON:  Chest x-ray 04/02/2018. FINDINGS: OG tube noted with tip over the stomach. No bowel distention. Stool noted throughout the colon. No free air. No acute bony abnormality. IMPRESSION: OG tube noted with tip over the stomach.  No acute abnormality. Electronically Signed   By: Maisie Fus  Register    On: 04/02/2018 10:35   Ct Head Wo Contrast  Result Date: 04/01/2018 CLINICAL DATA:  Post CPR EXAM: CT HEAD WITHOUT CONTRAST TECHNIQUE: Contiguous axial images were obtained from the base of the skull through the vertex without intravenous contrast. COMPARISON:  08/10/2015 FINDINGS: Brain: No evidence of acute infarction, hemorrhage, hydrocephalus, extra-axial collection or mass lesion/mass effect. Vascular: No hyperdense vessel or unexpected calcification. Skull: Normal. Negative for fracture or focal lesion. Sinuses/Orbits: No acute finding. Other: None. IMPRESSION: No acute intracranial pathology. Electronically Signed   By: Lauralyn Primes M.D.   On: 04/01/2018 10:51   US Renal  Result Date: 04/02/2018 CLINICAL DATA:  Acute kidney injury EXAM: RENAL / URINARY TRACT ULTRASOUND COMPLETE COMPARISON:  None. FINDINGS: Right Kidney: Renal measurements: 11 x 4.3 x 4.7 cm = volume: 120.1 mL. Cortical echogenicity normal. No hydronephrosis. Possible scarring at the midpole. Left Kidney: Renal measurements: 10.3 x 3.7 x 5.7 cm = volume: 115.7 mL. Echogenicity within normal limits. No mass or hydronephrosis visualized. Bladder: Foley catheter in the bladder which is empty. Small amount of free fluid in the pelvis IMPRESSION: 1. Negative for hydronephrosis. There may be mild cortical scarring at the mid right kidney 2. Small amount of free fluid in the pelvis Electronically Signed   By: Jasmine Pang M.D.   On: 04/02/2018 19:46   Dg Chest Port 1 View  Result Date: 04/03/2018 CLINICAL DATA:  Endotracheal tube present. Acute respiratory failure with hypoxia. EXAM: PORTABLE CHEST 1 VIEW COMPARISON:  One-view chest x-ray 04/02/2018 FINDINGS: Heart size is normal. Endotracheal tube is in satisfactory position. NG tube courses off the inferior border of the film. Progressive right middle lobe airspace disease is present. Lung volumes are low. IMPRESSION: 1. Progressive right middle lobe airspace disease concerning for  infection. 2. Support apparatus is stable. Electronically Signed   By: Marin Roberts M.D.   On: 04/03/2018 07:01   Dg Chest Port 1 View  Result Date: 04/02/2018 CLINICAL DATA:  Intubated. Acute respiratory failure. EXAM: PORTABLE CHEST 1 VIEW COMPARISON:  04/01/2018. FINDINGS: The endotracheal tube remains in satisfactory position. Nasogastric tube extending into the stomach with its side hole in the proximal stomach. Stable borderline enlarged cardiac silhouette and clear lungs. Unremarkable bones. IMPRESSION: Stable borderline cardiomegaly. No acute abnormalities. Electronically Signed   By: Beckie Salts M.D.   On: 04/02/2018 09:11   Dg Chest Portable 1 View  Result Date: 04/01/2018 CLINICAL DATA:  Status post  intubation. EXAM: PORTABLE CHEST 1 VIEW COMPARISON:  None. FINDINGS: The heart size and mediastinal contours are within normal limits. Both lungs are clear. Endotracheal tube is seen projected over tracheal air shadow with distal tip 5 cm above the carina. Distal tip of nasogastric tube is seen in proximal stomach. The visualized skeletal structures are unremarkable. IMPRESSION: Endotracheal tube in grossly good position. Distal tip of nasogastric tube in proximal stomach. No acute cardiopulmonary abnormality seen. Electronically Signed   By: Lupita Raider, M.D.   On: 04/01/2018 10:20     LOS: 7 days   Jeoffrey Massed, MD  [facility]  If 7PM-7AM, please contact night-coverage  Please page via [email or web address]  Go to [email or web address] and use [facility]'s universal password to access. If you do not have the password, please contact the hospital operator.  Locate the [facility] provider you are looking for under [facility] and page to a number that you can be directly reached. If you still have difficulty reaching the provider, please page the [facility] (Director on Call) for the Hospitalists listed on amion for assistance.  04/08/2018, 8:43 AM

## 2018-04-08 NOTE — Progress Notes (Signed)
CSW alerted that patient is medically stable today to pursue inpatient psych placement. CSW contacted [facility] and spoke with Inetta Fermo to provide referral on patient. They will call CSW back after review and when a bed is available.  CSW to follow.  Blenda Nicely, [place] Clinical Social Worker [phone]

## 2018-04-08 NOTE — Discharge Summary (Signed)
PATIENT DETAILS Name: David Dennis Age: 53 y.o. Sex: male Date of Birth: [birth year] MRN: [id number]. Admitting Physician: Luciano Cutter, MD [id number], [facility]  Admit Date: 04/01/2018 Discharge date: 04/08/2018  Recommendations for Outpatient Follow-up:  1. Follow up with PCP in 1-2 weeks 2. Please obtain renal function in 2 to 3 days, repeat LFTs in 2 to 3 days 3. Ensure follow-up with cardiology-see appointment below  Admitted From:  Home  Disposition: Inpatient psychiatry   Home Health: No  Equipment/Devices: None  Discharge Condition: Stable  CODE STATUS: FULL CODE  Diet recommendation:  Heart Healthy   Brief Summary: See H&P, Labs, Consult and Test reports for all details in brief, Patient is a 53 y.o. male with history of depression, anxiety, hypertension, seizure disorder-into the emergency room unresponsive, pulseless, received 3 minutes of CPR with ROSC-admitted to the ICU intubated-further evaluation revealed AKI, shock liver and cardiomyopathy.  Now thought that this was probably secondary to a suicidal attempt with drug overdose.  See below for further details  Brief Hospital Course: Acute hypoxic respiratory failure: Intubated in the emergency room due to failure to protect airway--subsequently extubated- 2/20-due to continued clinical improvement, has now been titrated to room air.  AKI: Likely ATN in the setting of cardiac arrest-creatinine tenuous to improve rapidly with just supportive care-he is not on IV fluids.  Continue to follow electrolytes-suspect creatinine will normalize in the next few days.   Please repeat chemistry panel and 2-3 days.  Elevated transaminases secondary to shock liver: LFTs continue to downtrend with just supportive care, hepatitis serology was negative.  Follow LFTs periodically-this should normalize over the next few days.    PEA arrest: Thought to be in the setting of presumed drug overdose.   Cardiology/CHF team recommending supportive care, once renal function improves further-cardiology will perform work-up in the future.  Acute systolic heart failure (EF 20-25%): Thought to be due to myocardial stunning secondary to cardiac arrest.  Volume status is stable.  Blood pressure on the higher side-seems to be tolerating Coreg.  Continue to avoid ACEI/ARB due to renal function-but suspect that we can probably initiate these agents in the next few days once renal function normalizes.  Per CHF team-they will perform further work-up in the future-once patient's renal function stabilizes further-they have arranged for outpatient follow-up-see below  Fever likely secondary to aspiration pneumonia: Does have a documented right lung infiltrate on chest x-ray on 2/21-sputum cultures positive for MSSA.  Patiently on IV Rocephin-switch to Keflex-has completed more than 7 days of treatment.    All antibiotics were discontinued on 2/26  Hypertension: BP relatively well controlled-continue Coreg.  Seizure disorder: Continue Keppra  Drug overdose/suicidal attempt: Evaluated by psychiatry-recommendations are to transfer to inpatient psych.  Depression/anxiety: Appears depressed-but this morning claims his mood is stable.  Gabapentin 100 mg twice daily, and Seroquel as needed at bedtime.  See above regarding plans to transfer to inpatient psych when he is more stable  Moderate Malnutrition  Procedures/Studies: None  Discharge Diagnoses:  Principal Problem:   Major depressive disorder, recurrent episode, severe (HCC) Active Problems:   Overdose   Cardiac arrest [facility])   Social anxiety disorder   Intermittent explosive disorder   Acute respiratory failure with hypoxia (HCC)   AKI (acute kidney injury) (HCC)   Malnutrition of moderate degree   Discharge Instructions:  Activity:  As tolerated   Discharge Instructions    (HEART FAILURE PATIENTS) Call MD:  Anytime you have any of  the  following symptoms: 1) 3 pound weight gain in 24 hours or 5 pounds in 1 week 2) shortness of breath, with or without a dry hacking cough 3) swelling in the hands, feet or stomach 4) if you have to sleep on extra pillows at night in order to breathe.   Complete by:  As directed    Diet - low sodium heart healthy   Complete by:  As directed    Increase activity slowly   Complete by:  As directed      Allergies as of 04/08/2018   No Known Allergies     Medication List    STOP taking these medications   amLODipine 10 MG tablet Commonly known as:  NORVASC   chlordiazePOXIDE 25 MG capsule Commonly known as:  LIBRIUM   clonazePAM 1 MG tablet Commonly known as:  KLONOPIN   metoprolol tartrate 50 MG tablet Commonly known as:  LOPRESSOR   OLANZapine 20 MG tablet Commonly known as:  ZYPREXA   venlafaxine XR 150 MG 24 hr capsule Commonly known as:  EFFEXOR-XR     TAKE these medications   carvedilol 6.25 MG tablet Commonly known as:  COREG Take 1 tablet (6.25 mg total) by mouth 2 (two) times daily with a meal.   feeding supplement (ENSURE ENLIVE) Liqd Take 237 mLs by mouth 2 (two) times daily between meals.   folic acid 1 MG tablet Commonly known as:  FOLVITE Take 1 tablet (1 mg total) by mouth at bedtime.   gabapentin 100 MG capsule Commonly known as:  NEURONTIN Take 1 capsule (100 mg total) by mouth 2 (two) times daily.   levETIRAcetam 500 MG tablet Commonly known as:  KEPPRA Take 1 tablet (500 mg total) by mouth 2 (two) times daily.   MULTIVITAL PO Take 1 tablet by mouth daily.   QUEtiapine 25 MG tablet Commonly known as:  SEROQUEL Take 1 tablet (25 mg total) by mouth daily as needed (at bedtime for sleep).   thiamine 100 MG tablet Take 1 tablet (100 mg total) by mouth at bedtime.      Follow-up Information    [facility] Follow up on 04/22/2018.   Specialty:  Cardiology Why:  Heart Failure F/U 04/22/18   -Parking  in ER lot (enter under blue awning to left of ER), or underneath [facility] in the [place] on [address] (garage code:[id number], elevator to 1st floor).  -Take all am meds and bring all med bottles  Contact information: [address] [id number] mc [place] [address] [phone]       Center, [facility]. Schedule an appointment as soon as possible for a visit in 1 week(s).   Specialty:  General Practice Contact information: [address] [place] [address] [phone]          No Known Allergies  Consultations:   psychiatry   Other Procedures/Studies: Dg Abd 1 View  Result Date: 04/02/2018 CLINICAL DATA:  OG tube placement. EXAM: ABDOMEN - 1 VIEW COMPARISON:  Chest x-ray 04/02/2018. FINDINGS: OG tube noted with tip over the stomach. No bowel distention. Stool noted throughout the colon. No free air. No acute bony abnormality. IMPRESSION: OG tube noted with tip over the stomach.  No acute abnormality. Electronically Signed   By: Maisie Fus  Register   On: 04/02/2018 10:35   Ct Head Wo Contrast  Result Date: 04/01/2018 CLINICAL DATA:  Post CPR EXAM: CT HEAD WITHOUT CONTRAST TECHNIQUE: Contiguous axial  images were obtained from the base of the skull through the vertex without intravenous contrast. COMPARISON:  08/10/2015 FINDINGS: Brain: No evidence of acute infarction, hemorrhage, hydrocephalus, extra-axial collection or mass lesion/mass effect. Vascular: No hyperdense vessel or unexpected calcification. Skull: Normal. Negative for fracture or focal lesion. Sinuses/Orbits: No acute finding. Other: None. IMPRESSION: No acute intracranial pathology. Electronically Signed   By: Lauralyn Primes M.D.   On: 04/01/2018 10:51   US Renal  Result Date: 04/02/2018 CLINICAL DATA:  Acute kidney injury EXAM: RENAL / URINARY TRACT ULTRASOUND COMPLETE COMPARISON:  None. FINDINGS: Right Kidney: Renal measurements: 11 x 4.3 x 4.7 cm = volume: 120.1 mL. Cortical  echogenicity normal. No hydronephrosis. Possible scarring at the midpole. Left Kidney: Renal measurements: 10.3 x 3.7 x 5.7 cm = volume: 115.7 mL. Echogenicity within normal limits. No mass or hydronephrosis visualized. Bladder: Foley catheter in the bladder which is empty. Small amount of free fluid in the pelvis IMPRESSION: 1. Negative for hydronephrosis. There may be mild cortical scarring at the mid right kidney 2. Small amount of free fluid in the pelvis Electronically Signed   By: Jasmine Pang M.D.   On: 04/02/2018 19:46   Dg Chest Port 1 View  Result Date: 04/03/2018 CLINICAL DATA:  Endotracheal tube present. Acute respiratory failure with hypoxia. EXAM: PORTABLE CHEST 1 VIEW COMPARISON:  One-view chest x-ray 04/02/2018 FINDINGS: Heart size is normal. Endotracheal tube is in satisfactory position. NG tube courses off the inferior border of the film. Progressive right middle lobe airspace disease is present. Lung volumes are low. IMPRESSION: 1. Progressive right middle lobe airspace disease concerning for infection. 2. Support apparatus is stable. Electronically Signed   By: Marin Roberts M.D.   On: 04/03/2018 07:01   Dg Chest Port 1 View  Result Date: 04/02/2018 CLINICAL DATA:  Intubated. Acute respiratory failure. EXAM: PORTABLE CHEST 1 VIEW COMPARISON:  04/01/2018. FINDINGS: The endotracheal tube remains in satisfactory position. Nasogastric tube extending into the stomach with its side hole in the proximal stomach. Stable borderline enlarged cardiac silhouette and clear lungs. Unremarkable bones. IMPRESSION: Stable borderline cardiomegaly. No acute abnormalities. Electronically Signed   By: Beckie Salts M.D.   On: 04/02/2018 09:11   Dg Chest Portable 1 View  Result Date: 04/01/2018 CLINICAL DATA:  Status post intubation. EXAM: PORTABLE CHEST 1 VIEW COMPARISON:  None. FINDINGS: The heart size and mediastinal contours are within normal limits. Both lungs are clear. Endotracheal tube is  seen projected over tracheal air shadow with distal tip 5 cm above the carina. Distal tip of nasogastric tube is seen in proximal stomach. The visualized skeletal structures are unremarkable. IMPRESSION: Endotracheal tube in grossly good position. Distal tip of nasogastric tube in proximal stomach. No acute cardiopulmonary abnormality seen. Electronically Signed   By: Lupita Raider, M.D.   On: 04/01/2018 10:20      TODAY-DAY OF DISCHARGE:  Subjective:   David Dennis today has no headache,no chest abdominal pain,no new weakness tingling or numbness, feels much better wants to go home today.   Objective:   Blood pressure (!) 154/108, pulse 75, temperature 98.5 F (36.9 C), temperature source Oral, resp. rate 18, height 6\' 2"  (1.88 m), weight 67.8 kg, SpO2 99 %.  Intake/Output Summary (Last 24 hours) at 04/08/2018 1354 Last data filed at 04/08/2018 0730 Gross per 24 hour  Intake 780 ml  Output -  Net 780 ml   Filed Weights   04/07/18 0638 04/07/18 1919 04/08/18 0500  Weight: 68.4 kg 68.8  kg 67.8 kg    Exam: Awake Alert, Oriented *3, No new F.N deficits, Normal affect [place].AT,PERRAL Supple Neck,No JVD, No cervical lymphadenopathy appriciated.  Symmetrical Chest wall movement, Good air movement bilaterally, CTAB RRR,No Gallops,Rubs or new Murmurs, No Parasternal Heave +ve B.Sounds, Abd Soft, Non tender, No organomegaly appriciated, No rebound -guarding or rigidity. No Cyanosis, Clubbing or edema, No new Rash or bruise   PERTINENT RADIOLOGIC STUDIES: Dg Abd 1 View  Result Date: 04/02/2018 CLINICAL DATA:  OG tube placement. EXAM: ABDOMEN - 1 VIEW COMPARISON:  Chest x-ray 04/02/2018. FINDINGS: OG tube noted with tip over the stomach. No bowel distention. Stool noted throughout the colon. No free air. No acute bony abnormality. IMPRESSION: OG tube noted with tip over the stomach.  No acute abnormality. Electronically Signed   By: Maisie Fus  Register   On: 04/02/2018 10:35   Ct Head Wo  Contrast  Result Date: 04/01/2018 CLINICAL DATA:  Post CPR EXAM: CT HEAD WITHOUT CONTRAST TECHNIQUE: Contiguous axial images were obtained from the base of the skull through the vertex without intravenous contrast. COMPARISON:  08/10/2015 FINDINGS: Brain: No evidence of acute infarction, hemorrhage, hydrocephalus, extra-axial collection or mass lesion/mass effect. Vascular: No hyperdense vessel or unexpected calcification. Skull: Normal. Negative for fracture or focal lesion. Sinuses/Orbits: No acute finding. Other: None. IMPRESSION: No acute intracranial pathology. Electronically Signed   By: Lauralyn Primes M.D.   On: 04/01/2018 10:51   US Renal  Result Date: 04/02/2018 CLINICAL DATA:  Acute kidney injury EXAM: RENAL / URINARY TRACT ULTRASOUND COMPLETE COMPARISON:  None. FINDINGS: Right Kidney: Renal measurements: 11 x 4.3 x 4.7 cm = volume: 120.1 mL. Cortical echogenicity normal. No hydronephrosis. Possible scarring at the midpole. Left Kidney: Renal measurements: 10.3 x 3.7 x 5.7 cm = volume: 115.7 mL. Echogenicity within normal limits. No mass or hydronephrosis visualized. Bladder: Foley catheter in the bladder which is empty. Small amount of free fluid in the pelvis IMPRESSION: 1. Negative for hydronephrosis. There may be mild cortical scarring at the mid right kidney 2. Small amount of free fluid in the pelvis Electronically Signed   By: Jasmine Pang M.D.   On: 04/02/2018 19:46   Dg Chest Port 1 View  Result Date: 04/03/2018 CLINICAL DATA:  Endotracheal tube present. Acute respiratory failure with hypoxia. EXAM: PORTABLE CHEST 1 VIEW COMPARISON:  One-view chest x-ray 04/02/2018 FINDINGS: Heart size is normal. Endotracheal tube is in satisfactory position. NG tube courses off the inferior border of the film. Progressive right middle lobe airspace disease is present. Lung volumes are low. IMPRESSION: 1. Progressive right middle lobe airspace disease concerning for infection. 2. Support apparatus is  stable. Electronically Signed   By: Marin Roberts M.D.   On: 04/03/2018 07:01   Dg Chest Port 1 View  Result Date: 04/02/2018 CLINICAL DATA:  Intubated. Acute respiratory failure. EXAM: PORTABLE CHEST 1 VIEW COMPARISON:  04/01/2018. FINDINGS: The endotracheal tube remains in satisfactory position. Nasogastric tube extending into the stomach with its side hole in the proximal stomach. Stable borderline enlarged cardiac silhouette and clear lungs. Unremarkable bones. IMPRESSION: Stable borderline cardiomegaly. No acute abnormalities. Electronically Signed   By: Beckie Salts M.D.   On: 04/02/2018 09:11   Dg Chest Portable 1 View  Result Date: 04/01/2018 CLINICAL DATA:  Status post intubation. EXAM: PORTABLE CHEST 1 VIEW COMPARISON:  None. FINDINGS: The heart size and mediastinal contours are within normal limits. Both lungs are clear. Endotracheal tube is seen projected over tracheal air shadow with distal tip 5  cm above the carina. Distal tip of nasogastric tube is seen in proximal stomach. The visualized skeletal structures are unremarkable. IMPRESSION: Endotracheal tube in grossly good position. Distal tip of nasogastric tube in proximal stomach. No acute cardiopulmonary abnormality seen. Electronically Signed   By: Lupita Raider, M.D.   On: 04/01/2018 10:20     PERTINENT LAB RESULTS: CBC: Recent Labs    04/06/18 0412 04/08/18 0332  WBC 8.5 10.7*  HGB 11.4* 11.8*  HCT 33.1* 33.6*  PLT 82* 86*   CMET CMP     Component Value Date/Time   NA 139 04/08/2018 0332   K 3.7 04/08/2018 0332   CL 104 04/08/2018 0332   CO2 21 (L) 04/08/2018 0332   GLUCOSE 93 04/08/2018 0332   BUN 53 (H) 04/08/2018 0332   CREATININE 1.88 (H) 04/08/2018 0332   CALCIUM 9.0 04/08/2018 0332   PROT 6.5 04/08/2018 0332   ALBUMIN 3.4 (L) 04/08/2018 0332   AST 71 (H) 04/08/2018 0332   ALT 881 (H) 04/08/2018 0332   ALKPHOS 112 04/08/2018 0332   BILITOT 1.0 04/08/2018 0332   GFRNONAA 40 (L) 04/08/2018 0332    GFRAA 47 (L) 04/08/2018 0332    GFR Estimated Creatinine Clearance: 44.6 mL/min (A) (by C-G formula based on SCr of 1.88 mg/dL (H)). No results for input(s): LIPASE, AMYLASE in the last 72 hours. No results for input(s): CKTOTAL, CKMB, CKMBINDEX, TROPONINI in the last 72 hours. Invalid input(s): POCBNP No results for input(s): DDIMER in the last 72 hours. No results for input(s): HGBA1C in the last 72 hours. No results for input(s): CHOL, HDL, LDLCALC, TRIG, CHOLHDL, LDLDIRECT in the last 72 hours. No results for input(s): TSH, T4TOTAL, T3FREE, THYROIDAB in the last 72 hours.  Invalid input(s): FREET3 No results for input(s): VITAMINB12, FOLATE, FERRITIN, TIBC, IRON, RETICCTPCT in the last 72 hours. Coags: No results for input(s): INR in the last 72 hours.  Invalid input(s): PT Microbiology: Recent Results (from the past 240 hour(s))  Culture, blood (routine x 2)     Status: None   Collection Time: 04/01/18 12:00 PM  Result Value Ref Range Status   Specimen Description BLOOD RIGHT HAND  Final   Special Requests   Final    BOTTLES DRAWN AEROBIC AND ANAEROBIC Blood Culture results may not be optimal due to an inadequate volume of blood received in culture bottles   Culture   Final    NO GROWTH 5 DAYS Performed at [facility], [address] [place], [place] [address]    Report Status 04/06/2018 FINAL  Final  Culture, blood (routine x 2)     Status: None   Collection Time: 04/01/18 12:07 PM  Result Value Ref Range Status   Specimen Description BLOOD RIGHT FOREARM  Final   Special Requests   Final    BOTTLES DRAWN AEROBIC AND ANAEROBIC Blood Culture adequate volume   Culture   Final    NO GROWTH 5 DAYS Performed at [facility], [address] [place], [place] [address]    Report Status 04/06/2018 FINAL  Final  Urine culture     Status: None   Collection Time: 04/01/18  1:01 PM  Result Value Ref Range Status   Specimen Description URINE, CATHETERIZED  Final    Special Requests NONE  Final   Culture   Final    NO GROWTH Performed at [facility], [address] [place], [place] [address]    Report Status 04/02/2018  FINAL  Final  MRSA PCR Screening     Status: None   Collection Time: 04/01/18  5:36 PM  Result Value Ref Range Status   MRSA by PCR NEGATIVE NEGATIVE Final    Comment:        The GeneXpert MRSA Assay (FDA approved for NASAL specimens only), is one component of a comprehensive MRSA colonization surveillance program. It is not intended to diagnose MRSA infection nor to guide or monitor treatment for MRSA infections. Performed at [facility], [address], [place], [place] [address]   Culture, respiratory (tracheal aspirate)     Status: None   Collection Time: 04/02/18 12:07 AM  Result Value Ref Range Status   Specimen Description TRACHEAL ASPIRATE  Final   Special Requests NONE  Final   Gram Stain   Final    RARE WBC PRESENT, PREDOMINANTLY PMN ABUNDANT GRAM POSITIVE COCCI Performed at [facility], [address], [place], [place] [address]    Culture MODERATE STAPHYLOCOCCUS AUREUS  Final   Report Status 04/05/2018 FINAL  Final   Organism ID, Bacteria STAPHYLOCOCCUS AUREUS  Final      Susceptibility   Staphylococcus aureus - MIC*    CIPROFLOXACIN <=0.5 SENSITIVE Sensitive     ERYTHROMYCIN RESISTANT Resistant     GENTAMICIN <=0.5 SENSITIVE Sensitive     OXACILLIN 0.5 SENSITIVE Sensitive     TETRACYCLINE <=1 SENSITIVE Sensitive     VANCOMYCIN <=0.5 SENSITIVE Sensitive     TRIMETH/SULFA <=10 SENSITIVE Sensitive     CLINDAMYCIN RESISTANT Resistant     RIFAMPIN <=0.5 SENSITIVE Sensitive     Inducible Clindamycin POSITIVE Resistant     * MODERATE STAPHYLOCOCCUS AUREUS  Respiratory Panel by PCR     Status: None   Collection Time: 04/02/18  5:29 PM  Result Value Ref Range Status   Adenovirus NOT DETECTED NOT DETECTED Final   Coronavirus 229E NOT DETECTED NOT DETECTED Final    Comment:  (NOTE) The Coronavirus on the Respiratory Panel, DOES NOT test for the novel  Coronavirus (2019 nCoV)    Coronavirus HKU1 NOT DETECTED NOT DETECTED Final   Coronavirus NL63 NOT DETECTED NOT DETECTED Final   Coronavirus OC43 NOT DETECTED NOT DETECTED Final   Metapneumovirus NOT DETECTED NOT DETECTED Final   Rhinovirus / Enterovirus NOT DETECTED NOT DETECTED Final   Influenza A NOT DETECTED NOT DETECTED Final   Influenza B NOT DETECTED NOT DETECTED Final   Parainfluenza Virus 1 NOT DETECTED NOT DETECTED Final   Parainfluenza Virus 2 NOT DETECTED NOT DETECTED Final   Parainfluenza Virus 3 NOT DETECTED NOT DETECTED Final   Parainfluenza Virus 4 NOT DETECTED NOT DETECTED Final   Respiratory Syncytial Virus NOT DETECTED NOT DETECTED Final   Bordetella pertussis NOT DETECTED NOT DETECTED Final   Chlamydophila pneumoniae NOT DETECTED NOT DETECTED Final   Mycoplasma pneumoniae NOT DETECTED NOT DETECTED Final    Comment: Performed at [facility], [address] [place], [place] [address]    FURTHER DISCHARGE INSTRUCTIONS:  Get Medicines reviewed and adjusted: Please take all your medications with you for your next visit with your Primary MD  Laboratory/radiological data: Please request your Primary MD to go over all hospital tests and procedure/radiological results at the follow up, please ask your Primary MD to get all Hospital records sent to his/her office.  In some cases, they will be blood work, cultures and biopsy results pending at the time of your discharge. Please request that your primary  care M.D. goes through all the records of your hospital data and follows up on these results.  Also Note the following: If you experience worsening of your admission symptoms, develop shortness of breath, life threatening emergency, suicidal or homicidal thoughts you must seek medical attention immediately by calling 911 or calling your MD immediately  if symptoms less severe.  You must  read complete instructions/literature along with all the possible adverse reactions/side effects for all the Medicines you take and that have been prescribed to you. Take any new Medicines after you have completely understood and accpet all the possible adverse reactions/side effects.   Do not drive when taking Pain medications or sleeping medications (Benzodaizepines)  Do not take more than prescribed Pain, Sleep and Anxiety Medications. It is not advisable to combine anxiety,sleep and pain medications without talking with your primary care practitioner  Special Instructions: If you have smoked or chewed Tobacco  in the last 2 yrs please stop smoking, stop any regular Alcohol  and or any Recreational drug use.  Wear Seat belts while driving.  Please note: You were cared for by a hospitalist during your hospital stay. Once you are discharged, your primary care physician will handle any further medical issues. Please note that NO REFILLS for any discharge medications will be authorized once you are discharged, as it is imperative that you return to your primary care physician (or establish a relationship with a primary care physician if you do not have one) for your post hospital discharge needs so that they can reassess your need for medications and monitor your lab values.  Total Time spent coordinating discharge including counseling, education and face to face time equals 35 minutes.  SignedJeoffrey Massed 04/08/2018 1:54 PM

## 2018-04-08 NOTE — Progress Notes (Signed)
CSW rescinded IVC (patient willing to go to [facility] voluntarily-witnessed by MD). [facility] has abed available for patient today at 4pm. CSW faxing voluntary form.   Osborne Casco Ikram Riebe LCSW [phone]

## 2018-04-08 NOTE — Tx Team (Signed)
Initial Treatment Plan 04/08/2018 6:59 PM [name] Dennis XFQ:[id number]    PATIENT STRESSORS: Medication change or noncompliance Substance abuse   PATIENT STRENGTHS: Communication skills Motivation for treatment/growth Supportive family/friends   PATIENT IDENTIFIED PROBLEMS: "More than I can handle."  "I'm nervous around people"  Depression  Anxiety               DISCHARGE CRITERIA:  Ability to meet basic life and health needs Improved stabilization in mood, thinking, and/or behavior Medical problems require only outpatient monitoring  PRELIMINARY DISCHARGE PLAN: Outpatient therapy Return to previous living arrangement  PATIENT/FAMILY INVOLVEMENT: This treatment plan has been presented to and reviewed with the patient, David Dennis.  The patient and family have been given the opportunity to ask questions and make suggestions.  Dewayne Shorter, RN 04/08/2018, 6:59 PM

## 2018-04-08 NOTE — Progress Notes (Signed)
Patient discharged to [facility] via transportation service. IV's Removed.

## 2018-04-08 NOTE — Progress Notes (Signed)
Patient will DC to: [facility] Anticipated DC date: 04/08/18 Family notified: At bedside Transport by: Leata Mouse  306 Bed 1 Accepting MD: Dr. Jola Babinski   Per MD patient ready for DC to [facility]. RN, patient, patient's family, and facility notified of DC. Discharge Summary  And voluntary form sent to facility. RN to call report prior to discharge [phone]). DNR on chart.   CSW will sign off for now as social work intervention is no longer needed. Please consult [place] again if new needs arise.  Cristobal Goldmann, LCSW Clinical Social Worker [phone]

## 2018-04-08 NOTE — Progress Notes (Signed)
Report called to [facility] 

## 2018-04-09 DIAGNOSIS — F200 Paranoid schizophrenia: Secondary | ICD-10-CM | POA: Diagnosis present

## 2018-04-09 LAB — GLUCOSE, CAPILLARY: Glucose-Capillary: 84 mg/dL (ref 70–99)

## 2018-04-09 MED ORDER — LEVETIRACETAM 500 MG PO TABS
500.0000 mg | ORAL_TABLET | Freq: Two times a day (BID) | ORAL | Status: DC
  Administered 2018-04-09 – 2018-04-21 (×25): 500 mg via ORAL
  Filled 2018-04-09 (×4): qty 1
  Filled 2018-04-09: qty 14
  Filled 2018-04-09 (×23): qty 1
  Filled 2018-04-09: qty 14
  Filled 2018-04-09 (×3): qty 1

## 2018-04-09 MED ORDER — CARVEDILOL 6.25 MG PO TABS
6.2500 mg | ORAL_TABLET | Freq: Two times a day (BID) | ORAL | Status: DC
  Administered 2018-04-09: 6.25 mg via ORAL
  Filled 2018-04-09 (×3): qty 1

## 2018-04-09 MED ORDER — VENLAFAXINE HCL ER 150 MG PO CP24
150.0000 mg | ORAL_CAPSULE | Freq: Every day | ORAL | Status: DC
  Administered 2018-04-09 – 2018-04-13 (×5): 150 mg via ORAL
  Filled 2018-04-09 (×7): qty 1

## 2018-04-09 MED ORDER — CARVEDILOL 12.5 MG PO TABS
12.5000 mg | ORAL_TABLET | Freq: Two times a day (BID) | ORAL | Status: DC
  Administered 2018-04-09 – 2018-04-10 (×2): 12.5 mg via ORAL
  Filled 2018-04-09 (×4): qty 1

## 2018-04-09 MED ORDER — OLANZAPINE 10 MG PO TABS
20.0000 mg | ORAL_TABLET | Freq: Every day | ORAL | Status: DC
  Administered 2018-04-09 – 2018-04-20 (×12): 20 mg via ORAL
  Filled 2018-04-09 (×12): qty 2
  Filled 2018-04-09: qty 14
  Filled 2018-04-09 (×2): qty 2

## 2018-04-09 NOTE — [facility] Suicide Risk Assessment (Signed)
[facility] Admission Suicide Risk Assessment   Nursing information obtained from:  Patient Demographic factors:  Male, Caucasian, Unemployed Current Mental Status:  Self-harm thoughts Loss Factors:  NA Historical Factors:  Prior suicide attempts, Impulsivity Risk Reduction Factors:  Sense of responsibility to family, Positive social support, Positive therapeutic relationship  Total Time spent with patient: 30 minutes Principal Problem: <principal problem not specified> Diagnosis:  Active Problems:   MDD (major depressive disorder), severe (HCC)  Subjective Data: Patient is seen and examined.  Patient is a 53 year old male with a reported past psychiatric history significant for depression, anxiety and schizophrenia who originally presented to the [facility] emergency department on 04/01/2018 after an intentional overdose.  The patient was found unresponsive with several medication bottles that were empty around him.  His medication bottles included metoprolol, clonazepam and olanzapine.  He was unresponsive in the field, and was intubated.  He remained in the medical side of the hospital until 04/08/2018.  During the course of the hospitalization he suffered significant issues including shock liver, acute kidney injury, and cardiomyopathy.  On 04/06/2018 his creatinine was at 3.63, his AST was at 375, and his ALT was at 1993.  On 2/26 his creatinine had dropped to 1.88, his AST down to 71, and his ALT at 881.  The patient stated that he had had psychiatric problems for several years.  Much of this did not occur until after he had been discharged from the [facility].  He has been followed at the [facility] for several years.  He most recently has been at the [facility].  He stated he had been previously diagnosed with depression, social anxiety disorder and schizophrenia.  He denied any auditory or visual hallucinations.  He stated that much of what had led to the diagnosis of schizophrenia was what  sounds like ideas of reference and looseness of associations.  This seem to be significantly paranoid in nature.  He stated on the date that he took the overdose he was tired of being alone, and was searching for a social relationship that would make him feel a value.  He currently lives with his mother and is disabled.  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  He has been on a stable dose of venlafaxine extended release, olanzapine and occasional clonazepam.  His last clonazepam prescription per the PMP database was in 2018.  He stated that he had drank alcohol significantly in the past, but had quit alcohol and benzodiazepines on the same night in November 2019.  He suffered seizures after that, and is been on seizure medication ever since.  He denied any other substances.  His drug screen on admission was significant only for the benzodiazepines that he had overdosed on.  His blood alcohol was less than 10.  Currently he is not sure how he feels about having survived the suicide attempt.  He stated he had one previous significant episode where he was suicidal.  He had gone and purchased a weapon, but had to wait 10 days to pick it up.  When he discussed this with his psychiatrist he was hospitalized at that time.  This was in [place].  He denied any previous exposure to compact circumstances.  He was admitted to the hospital for evaluation and stabilization.  Continued Clinical Symptoms:  Alcohol Use Disorder Identification Test Final Score (AUDIT): 30 The "Alcohol Use Disorders Identification Test", Guidelines for Use in Primary Care, Second Edition.  World Science writer [facility]).  Score between 0-7:  no or low risk or alcohol related problems. Score between 8-15:  moderate risk of alcohol related problems. Score between 16-19:  high risk of alcohol related problems. Score 20 or above:  warrants further diagnostic evaluation for alcohol dependence and  treatment.   CLINICAL FACTORS:   Severe Anxiety and/or Agitation Depression:   Hopelessness Impulsivity Insomnia Schizophrenia:   Depressive state More than one psychiatric diagnosis Previous Psychiatric Diagnoses and Treatments Medical Diagnoses and Treatments/Surgeries   Musculoskeletal: Strength & Muscle Tone: within normal limits Gait & Station: normal Patient leans: N/A  Psychiatric Specialty Exam: Physical Exam  Nursing note and vitals reviewed. Constitutional: He is oriented to person, place, and time. He appears well-developed and well-nourished.  HENT:  Head: Normocephalic and atraumatic.  Respiratory: Effort normal.  Neurological: He is alert and oriented to person, place, and time.    ROS  Blood pressure (!) 144/100, pulse 69, temperature 99.4 F (37.4 C), temperature source Oral, resp. rate 18, height 6' (1.829 m), weight 68 kg, SpO2 97 %.Body mass index is 20.34 kg/m.  General Appearance: Disheveled  Eye Contact:  Fair  Speech:  Normal Rate  Volume:  Decreased  Mood:  Anxious and Depressed  Affect:  Congruent  Thought Process:  Coherent, Goal Directed and Descriptions of Associations: Circumstantial  Orientation:  Full (Time, Place, and Person)  Thought Content:  Logical  Suicidal Thoughts:  No  Homicidal Thoughts:  No  Memory:  Immediate;   Fair Recent;   Fair Remote;   Fair  Judgement:  Impaired  Insight:  Fair  Psychomotor Activity:  Psychomotor Retardation  Concentration:  Concentration: Fair and Attention Span: Fair  Recall:  [employer] of Knowledge:  Fair  Language:  Fair  Akathisia:  Negative  Handed:  Right  AIMS (if indicated):     Assets:  Desire for Improvement Financial Resources/Insurance Housing Leisure Time Resilience Social Support  ADL's:  Intact  Cognition:  WNL  Sleep:  Number of Hours: 5.75      COGNITIVE FEATURES THAT CONTRIBUTE TO RISK:  None    SUICIDE RISK:   Moderate:  Frequent suicidal ideation with  limited intensity, and duration, some specificity in terms of plans, no associated intent, good self-control, limited dysphoria/symptomatology, some risk factors present, and identifiable protective factors, including available and accessible social support.  PLAN OF CARE: Patient is seen and examined.  Patient is a 53 year old male with the above-stated past psychiatric history who is seen on transfer from the medical hospital after a significant intentional overdose that was significantly lethal in nature.  He suffered intubation, shock liver, acute kidney injury, and some degree of heart failure from the event.  He will be admitted to the psychiatric hospital.  He will be integrated into the milieu.  He will be encouraged to attend groups.  We will attempt to get a release of information signed and discussed the case with his current psychiatrist at the [facility].  For now we will restart the venlafaxine XR 150 mg p.o. daily and the Zyprexa 20 mg p.o. nightly.  We will also restart his anticonvulsant medications of Keppra 500 mg p.o. twice daily.  He stated he had not been drinking alcohol prior to the event, and cited social stressors to lead him to overdose.  He is unclear at this point whether he is happy or sad over his own survival.  We will also attempt to get a release of information signed to be able to  discuss with his parents any triggers or things that may have occurred prior to the overdose.  He is currently hypertensive, but his heart rate is 66.  He is currently on carvedilol 6.25 mg p.o. twice daily, and we will attempt to titrate that.  He also has been started on Neurontin 100 mg p.o. twice daily and Seroquel 25 mg p.o. nightly.  The Seroquel did not help him sleep last night, and review of the records on his primary care notes shows that he had been on a stable dose of Zyprexa for a while.  We will restart that.  We will also continue the folic acid and thiamine to assist with  potential anoxic brain injury issues.  We will repeat his labs to continue to track improvement in his renal function as well as his liver function.  We will also move him to the 400 hall so he can take advantage of groups and treatment of mood and anxiety disorders.  I certify that inpatient services furnished can reasonably be expected to improve the patient's condition.   Antonieta Pert, MD 04/09/2018, 9:43 AM

## 2018-04-09 NOTE — [facility] Group Notes (Signed)
[facility] Group Notes:  Nursing Psychoeducation  Date:  04/09/2018  Time:  4:00 PM  Type of Therapy:  Psychoeducational Skills   Group Topic: Anxiety: Triggers & Coping Skills Patients are asked to identify an upcoming event that makes them anxious. Patient are then asked to share the worst thing that could happen, the best thing that could happen, and steps they can take to prevent the worst from happening as well as ways to cope.  Participation Level:  None  Participation Quality:  Resistant  Affect:  Anxious, and Depressed  Cognitive:  Alert and Oriented  Insight:  Limited  Engagement in Group:  Withdrawn; Minimal  Modes of Intervention:  Discussion, Socialization and Support  Summary of Progress/Problems: Patient was asked to share with the group but politely declined and stated, "No, thank you."  Ferrel Logan 04/09/2018, 5:00 PM

## 2018-04-09 NOTE — [facility] Counselor (Signed)
Adult Comprehensive Assessment  Patient ID: David Dennis, male   DOB: 1965/05/27, 53 y.o.   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 Educational / Learning stressors: Denies Employment / Job issues: Denies, full V.A. disability Family Relationships: Lives with parents but reports his famiy is emotionally cold/not Nurse, learning disability / Lack of resources (include bankruptcy): Denies, gets disaility income Housing / Lack of housing: Denies, lives with parents, has most of his life. Intends to return at discharge Physical health (include injuries & life threatening diseases): "That's the least of my concerns." Patient was in the medical hospital for several days after a very serious intentional overdose, ongoing testing to determine what longterm damage if any was sustained to patient's organs or cognitive functioning. Social relationships: Severely socially anxious and agoraphobic. "I'm a recluse." Substance abuse: Reports he was drinking alcohol heavily for 10 years but stopped cold [place] in November 2019. This is also when he stopped seeing his psychiatrist and stopped taking his psych meds. Bereavement / Loss: Denies  Living/Environment/Situation:  Living Arrangements: Parent Living conditions (as described by patient or guardian): "I live with my folks." Single family home in [place]. 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 What is atmosphere in current home: Comfortable  Family History:  Marital status: Single Are you  sexually active?: No What is your sexual orientation?: Heterosexual Has your sexual activity been affected by drugs, alcohol, medication, or emotional stress?: Anxiety keeps him from meeting people. Does patient have children?: No  Childhood History:  By whom was/is the patient raised?: Both parents Additional childhood history information: Born in [place], lived in [place] and [place], but parents moved back here.  Description of patient's relationship with caregiver when they were a child: "When I think back on it now, my parents were very isolative and cold." Reports there was no warmth or affection in the home. Patient's description of current relationship with people who raised him/her: Same How were you disciplined when you got in trouble as a child/adolescent?: Appropriate Does patient have siblings?: Yes Number of Siblings: 1 Description of patient's current relationship with siblings: Younger brother, lives in [place]. Their relationship is fine, patient reports that his brother is married and has children, so he's often busy. Did patient suffer any verbal/emotional/physical/sexual abuse as a child?: Yes(Emotional abuse from parents, relating to the lack of warmth and affection) Did patient suffer from severe childhood neglect?: No Has patient ever been sexually abused/assaulted/raped as an adolescent or adult?: No Was the patient ever a victim of a crime or a disaster?: No Witnessed domestic violence?: No Has patient been effected by domestic violence as an adult?: No  Education:  Highest grade of school patient has completed: Some college Currently a Consulting civil engineer?: No Learning disability?: No  Employment/Work Situation:   Employment situation: On disability Why is patient on disability: PTSD, mental health issues How long has patient been on disability: 15+ years Patient's job has been impacted by current illness: No What is the longest time patient has a held a job?:  Has spoaradically worked in Stage manager  stores, fast food, etc. Hard to work due to social anxiety. Where was the patient employed at that time?: n/a Did You Receive Any Psychiatric Treatment/Services While in the Military?: Yes Type of Psychiatric Treatment/Services in Military: 3 inpatient psychiatric hospitalizations in the early 1990's in [place] Are There Guns or Other Weapons in Your Home?: No  Financial Resources:   Financial resources: [employer] SSDI Does patient have a Lawyer or guardian?: No  Alcohol/Substance Abuse:   What has been your use of drugs/alcohol within the last 12 months?: Reports he drank daily (a lot) until he stopped in November 2019  If attempted suicide, did drugs/alcohol play a role in this?: Yes(Prescription medications.) Alcohol/Substance Abuse Treatment Hx: Denies past history Has alcohol/substance abuse ever caused legal problems?: No  Social Support System:   Forensic psychologist System: Poor Describe Community Support System: Parents Type of faith/religion: Raised Christian, but no strong affiliation now How does patient's faith help to cope with current illness?: n/a  Leisure/Recreation:   Leisure and Hobbies: None. "Trying to keep my head together."  Strengths/Needs:   What is the patient's perception of their strengths?: "There's so much I don't like about myself. I'm empty, there's nothing. I'm trying." Patient states they can use these personal strengths during their treatment to contribute to their recovery: N/a Patient states these barriers may affect/interfere with their treatment: Unsure Patient states these barriers may affect their return to the community: Unsure Other important information patient would like considered in planning for their treatment: Denies  Discharge Plan:   Currently receiving community mental health services: Yes (From Whom) Patient states concerns and preferences for aftercare planning are: Patient  reports he was going to the [place] in [place] every 3 months or so, but stopped around November 2019. He also stopped taking his medications at this time. He may be receptive to more frequent outpatient follow up at the [facility].  Patient states they will know when they are safe and ready for discharge when: "I'd like to have a counselor lined up and some medications." Hoping for better managed social anxiety. Does patient have access to transportation?: Yes Does patient have financial barriers related to discharge medications?: No Patient description of barriers related to discharge medications: none Will patient be returning to same living situation after discharge?: Yes  Summary/Recommendations:   Summary and Recommendations (to be completed by the evaluator): David Dennis is a 53 year old male from [facility] [place]). He presents voluntarily to [facility] from [facility] following an intentional overdose of prescribed medications on 04/01/2018. He shares during assessment that he had been planning to commit suicide for some time and his original plan was to use a pairing knife but did not want to leave a bloody mess. Patient reports struggling with severe social anxiety and depression throughout his entire life. Patient reports he was going to the [place] in [place] every 3 months or so, but stopped around November 2019. He also stopped taking his medications at this time. He may be receptive to more frequent outpatient follow up at the [facility]. and he intends to return home with his parents at discharge. Patient will benefit from crisis stabilization, medication management, therapeutic milieu, and referral services.   David Dennis. 04/09/2018

## 2018-04-09 NOTE — H&P (Signed)
Psychiatric Admission Assessment Adult  Patient Identification: David Dennis MRN:  [id number] Date of Evaluation:  04/09/2018 Chief Complaint:  MDD Principal Diagnosis: MDD (major depressive diAndree Elksorder), severe (HCC) Diagnosis:  Principal Problem:   MDD (major depressive disorder), severe (HCC) Active Problems:   Social anxiety disorder   Paranoid schizophrenia (HCC)  History of Present Illness: Patient is seen and examined.  Patient is a 53 year old male with a reported past psychiatric history significant for depression, anxiety and schizophrenia who originally presented to the [facility] emergency department on 04/01/2018 after an intentional overdose.  The patient was found unresponsive with several medication bottles that were empty around him.  His medication bottles included metoprolol, clonazepam and olanzapine.  He was unresponsive in the field, and was intubated.  He remained in the medical side of the hospital until 04/08/2018.  During the course of the hospitalization he suffered significant issues including shock liver, acute kidney injury, and cardiomyopathy.  On 04/06/2018 his creatinine was at 3.63, his AST was at 375, and his ALT was at 1993.  On 2/26 his creatinine had dropped to 1.88, his AST down to 71, and his ALT at 881.  The patient stated that he had had psychiatric problems for several years.  Much of this did not occur until after he had been discharged from the [facility]  He has been followed at the [facility] for several years.  He most recently has been at the [facility].  He stated he had been previously diagnosed with depression, social anxiety disorder and schizophrenia.  He denied any auditory or visual hallucinations.  He stated that much of what had led to the diagnosis of schizophrenia was what sounds like ideas of reference and looseness of associations.  This seem to be significantly paranoid in nature.  He stated on the date that he took the overdose he  was tired of being alone, and was searching for a social relationship that would make him feel a value.  He currently lives with his mother and is disabled.  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  He has been on a stable dose of venlafaxine extended release, olanzapine and occasional clonazepam.  His last clonazepam prescription per the PMP database was in 2018.  He stated that he had drank alcohol significantly in the past, but had quit alcohol and benzodiazepines on the same night in November 2019.  He suffered seizures after that, and is been on seizure medication ever since.  He denied any other substances.  His drug screen on admission was significant only for the benzodiazepines that he had overdosed on.  His blood alcohol was less than 10.  Currently he is not sure how he feels about having survived the suicide attempt.  He stated he had one previous significant episode where he was suicidal.  He had gone and purchased a weapon, but had to wait 10 days to pick it up.  When he discussed this with his psychiatrist he was hospitalized at that time.  [place]  He denied any previous exposure to compact circumstances.  He was admitted to the hospital for evaluation and stabilization.  Associated Signs/Symptoms: Depression Symptoms:  depressed mood, anhedonia, insomnia, psychomotor agitation, fatigue, feelings of worthlessness/guilt, difficulty concentrating, hopelessness, suicidal thoughts with specific plan, suicidal attempt, anxiety, loss of energy/fatigue, disturbed sleep, (Hypo) Manic Symptoms:  Distractibility, Impulsivity, Irritable Mood, Anxiety Symptoms:  Excessive Worry, Psychotic Symptoms:  Delusions, Ideas of Reference, Paranoia,  PTSD Symptoms: Negative Total Time spent with patient: 45 minutes  Past Psychiatric History: Patient has had at least one previous psychiatric hospitalization at the [facility] hospital in [place].  He has  been followed for the last several years at the [facility].  Review of the electronic medical record showed that he had been on his current dosage of medication for several years.  He has been previously diagnosed with depression, social anxiety disorder as well as schizophrenia.  Is the patient at risk to self? Yes.    Has the patient been a risk to self in the past 6 months? Yes.    Has the patient been a risk to self within the distant past? No.  Is the patient a risk to others? No.  Has the patient been a risk to others in the past 6 months? No.  Has the patient been a risk to others within the distant past? No.   Prior Inpatient Therapy:   Prior Outpatient Therapy:    Alcohol Screening: 1. How often do you have a drink containing alcohol?: 4 or more times a week 2. How many drinks containing alcohol do you have on a typical day when you are drinking?: 5 or 6 3. How often do you have six or more drinks on one occasion?: Daily or almost daily AUDIT-C Score: 10 4. How often during the last year have you found that you were not able to stop drinking once you had started?: Daily or almost daily 5. How often during the last year have you failed to do what was normally expected from you becasue of drinking?: Weekly 6. How often during the last year have you needed a first drink in the morning to get yourself going after a heavy drinking session?: Weekly 7. How often during the last year have you had a feeling of guilt of remorse after drinking?: Weekly 8. How often during the last year have you been unable to remember what happened the night before because you had been drinking?: Weekly 9. Have you or someone else been injured as a result of your drinking?: No 10. Has a relative or friend or a doctor or another health worker been concerned about your drinking or suggested you cut down?: Yes, during the last year Alcohol Use Disorder Identification Test Final Score  (AUDIT): 30 Alcohol Brief Interventions/Follow-up: Alcohol Education Substance Abuse History in the last 12 months:  Yes.   Consequences of Substance Abuse: Negative Previous Psychotropic Medications: Yes  Psychological Evaluations: Yes  Past Medical History:  Past Medical History:  Diagnosis Date  . Depression   . Generalized anxiety disorder   . Hypertension    History reviewed. No pertinent surgical history. Family History: History reviewed. No pertinent family history. Family Psychiatric  History: He stated he has a brother with similar problems to his own. Tobacco Screening: Have you used any form of tobacco in the last 30 days? (Cigarettes, Smokeless Tobacco, Cigars, and/or Pipes): Yes Tobacco use, Select all that apply: 4 or less cigarettes per day Are you interested in Tobacco Cessation Medications?: No, patient refused Counseled patient on smoking cessation including recognizing danger situations, developing coping skills and basic information about quitting provided: Refused/Declined practical counseling Social History:  Social History   Substance and Sexual Activity  Alcohol Use Yes  . Alcohol/week: 84.0 standard drinks  . Types: 84 Cans of beer per week   Comment: "about a 12 pack of beer everyday"  Social History   Substance and Sexual Activity  Drug Use No    Additional Social History: Marital status: Single Are you sexually active?: No What is your sexual orientation?: Heterosexual Has your sexual activity been affected by drugs, alcohol, medication, or emotional stress?: Anxiety keeps him from meeting people. Does patient have children?: No                         Allergies:  No Known Allergies Lab Results:  Results for orders placed or performed during the hospital encounter of 04/01/18 (from the past 48 hour(s))  Glucose, capillary     Status: Abnormal   Collection Time: 04/07/18  4:57 PM  Result Value Ref Range   Glucose-Capillary 109  (H) 70 - 99 mg/dL  Glucose, capillary     Status: Abnormal   Collection Time: 04/07/18  8:23 PM  Result Value Ref Range   Glucose-Capillary 126 (H) 70 - 99 mg/dL  Glucose, capillary     Status: None   Collection Time: 04/08/18 12:08 AM  Result Value Ref Range   Glucose-Capillary 90 70 - 99 mg/dL  Comprehensive metabolic panel     Status: Abnormal   Collection Time: 04/08/18  3:32 AM  Result Value Ref Range   Sodium 139 135 - 145 mmol/L   Potassium 3.7 3.5 - 5.1 mmol/L   Chloride 104 98 - 111 mmol/L   CO2 21 (L) 22 - 32 mmol/L   Glucose, Bld 93 70 - 99 mg/dL   BUN 53 (H) 6 - 20 mg/dL   Creatinine, Ser 1.61 (H) 0.61 - 1.24 mg/dL   Calcium 9.0 8.9 - 09.6 mg/dL   Total Protein 6.5 6.5 - 8.1 g/dL   Albumin 3.4 (L) 3.5 - 5.0 g/dL   AST 71 (H) 15 - 41 U/L   ALT 881 (H) 0 - 44 U/L   Alkaline Phosphatase 112 38 - 126 U/L   Total Bilirubin 1.0 0.3 - 1.2 mg/dL   GFR calc non Af Amer 40 (L) >60 mL/min   GFR calc Af Amer 47 (L) >60 mL/min   Anion gap 14 5 - 15    Comment: Performed at [facility], [address] [place], [place] [address]  CBC     Status: Abnormal   Collection Time: 04/08/18  3:32 AM  Result Value Ref Range   WBC 10.7 (H) 4.0 - 10.5 K/uL   RBC 3.57 (L) 4.22 - 5.81 MIL/uL   Hemoglobin 11.8 (L) 13.0 - 17.0 g/dL   HCT 98.1 (L) 19.1 - 47.8 %   MCV 94.1 80.0 - 100.0 fL   MCH 33.1 26.0 - 34.0 pg   MCHC 35.1 30.0 - 36.0 g/dL   RDW 29.5 62.1 - 30.8 %   Platelets 86 (L) 150 - 400 K/uL    Comment: REPEATED TO VERIFY Immature Platelet Fraction may be clinically indicated, consider ordering this additional test [id number] CONSISTENT WITH PREVIOUS RESULT    nRBC 0.0 0.0 - 0.2 %    Comment: Performed at [facility], [address] [place], [place] [address]  Glucose, capillary     Status: Abnormal   Collection Time: 04/08/18  4:03 AM  Result Value Ref Range   Glucose-Capillary 101 (H) 70 - 99 mg/dL  Glucose, capillary     Status: Abnormal   Collection Time:  04/08/18  8:05 AM  Result Value Ref Range   Glucose-Capillary 122 (H) 70 - 99 mg/dL  Glucose, capillary     Status: Abnormal   Collection Time: 04/08/18 12:24 PM  Result Value Ref Range   Glucose-Capillary 114 (H) 70 - 99 mg/dL    Blood Alcohol level:  Lab Results  Component Value Date   ETH <10 04/01/2018   ETH <5 08/10/2015    Metabolic Disorder Labs:  No results found for: HGBA1C, MPG No results found for: PROLACTIN No results found for: CHOL, TRIG, HDL, CHOLHDL, VLDL, LDLCALC  Current Medications: Current Facility-Administered Medications  Medication Dose Route Frequency Provider Last Rate Last Dose  . carvedilol (COREG) tablet 12.5 mg  12.5 mg Oral BID WC Antonieta Pert, MD      . hydrOXYzine (ATARAX/VISTARIL) tablet 25 mg  25 mg Oral Q6H PRN Donell Sievert E, PA-C   25 mg at 04/09/18 0757  . levETIRAcetam (KEPPRA) tablet 500 mg  500 mg Oral BID Antonieta Pert, MD   500 mg at 04/09/18 1213  . OLANZapine (ZYPREXA) tablet 20 mg  20 mg Oral QHS Antonieta Pert, MD      . venlafaxine XR [facility]) 24 hr capsule 150 mg  150 mg Oral Q breakfast Antonieta Pert, MD   150 mg at 04/09/18 1213   PTA Medications: Medications Prior to Admission  Medication Sig Dispense Refill Last Dose  . carvedilol (COREG) 6.25 MG tablet Take 1 tablet (6.25 mg total) by mouth 2 (two) times daily with a meal.     . feeding supplement, ENSURE ENLIVE, (ENSURE ENLIVE) LIQD Take 237 mLs by mouth 2 (two) times daily between meals. 237 mL 12   . folic acid (FOLVITE) 1 MG tablet Take 1 tablet (1 mg total) by mouth at bedtime.     . gabapentin (NEURONTIN) 100 MG capsule Take 1 capsule (100 mg total) by mouth 2 (two) times daily.     Marland Kitchen levETIRAcetam (KEPPRA) 500 MG tablet Take 1 tablet (500 mg total) by mouth 2 (two) times daily. 60 tablet 0   . Multiple Vitamins-Minerals (MULTIVITAL PO) Take 1 tablet by mouth daily.   november  . QUEtiapine (SEROQUEL) 25 MG tablet Take 1 tablet (25 mg total)  by mouth daily as needed (at bedtime for sleep).     . thiamine 100 MG tablet Take 1 tablet (100 mg total) by mouth at bedtime.       Musculoskeletal: Strength & Muscle Tone: within normal limits Gait & Station: normal Patient leans: N/A  Psychiatric Specialty Exam: Physical Exam  Nursing note and vitals reviewed. Constitutional: He is oriented to person, place, and time. He appears well-developed and well-nourished.  HENT:  Head: Normocephalic and atraumatic.  Respiratory: Effort normal.  Neurological: He is alert and oriented to person, place, and time.    ROS  Blood pressure (!) 144/100, pulse 69, temperature 99.4 F (37.4 C), temperature source Oral, resp. rate 18, height 6' (1.829 m), weight 68 kg, SpO2 97 %.Body mass index is 20.34 kg/m.  General Appearance: Disheveled  Eye Contact:  Minimal  Speech:  Normal Rate  Volume:  Decreased  Mood:  Anxious and Depressed  Affect:  Congruent  Thought Process:  Coherent, Goal Directed and Descriptions of Associations: Intact  Orientation:  Full (Time, Place, and Person)  Thought Content:  Delusions and Ideas of Reference:   Paranoia  Suicidal Thoughts:  Yes.  with intent/plan  Homicidal Thoughts:  No  Memory:  Immediate;   Fair Recent;   Fair Remote;   Fair  Judgement:  Impaired  Insight:  Fair  Psychomotor Activity:  Decreased  Concentration:  Concentration: Fair and Attention Span: Fair  Recall:  [employer] of Knowledge:  Fair  Language:  Fair  Akathisia:  Negative  Handed:  Right  AIMS (if indicated):     Assets:  Communication Skills Desire for Improvement Financial Resources/Insurance Housing Resilience Social Support  ADL's:  Intact  Cognition:  WNL  Sleep:  Number of Hours: 5.75    Treatment Plan Summary: Daily contact with patient to assess and evaluate symptoms and progress in treatment, Medication management and Plan : Patient is seen and examined.  Patient is a 53 year old male with the above-stated  past psychiatric history who is seen on transfer from the medical hospital after a significant intentional overdose that was significantly lethal in nature.  He suffered intubation, shock liver, acute kidney injury, and some degree of heart failure from the event.  He will be admitted to the psychiatric hospital.  He will be integrated into the milieu.  He will be encouraged to attend groups.  We will attempt to get a release of information signed and discussed the case with his current psychiatrist at the [facility].  For now we will restart the venlafaxine XR 150 mg p.o. daily and the Zyprexa 20 mg p.o. nightly.  We will also restart his anticonvulsant medications of Keppra 500 mg p.o. twice daily.  He stated he had not been drinking alcohol prior to the event, and cited social stressors to lead him to overdose.  He is unclear at this point whether he is happy or sad over his own survival.  We will also attempt to get a release of information signed to be able to discuss with his parents any triggers or things that may have occurred prior to the overdose.  He is currently hypertensive, but his heart rate is 66.  He is currently on carvedilol 6.25 mg p.o. twice daily, and we will attempt to titrate that.  He also has been started on Neurontin 100 mg p.o. twice daily and Seroquel 25 mg p.o. nightly.  The Seroquel did not help him sleep last night, and review of the records on his primary care notes shows that he had been on a stable dose of Zyprexa for a while.  We will restart that.  We will also continue the folic acid and thiamine to assist with potential anoxic brain injury issues.  We will repeat his labs to continue to track improvement in his renal function as well as his liver function.  We will also move him to the 400 hall so he can take advantage of groups and treatment of mood and anxiety disorders.  Observation Level/Precautions:  15 minute checks Seizure  Laboratory:  Chemistry Profile   Psychotherapy:    Medications:    Consultations:    Discharge Concerns:    Estimated LOS:  Other:     Physician Treatment Plan for Primary Diagnosis: MDD (major depressive disorder), severe (HCC) Long Term Goal(s): Improvement in symptoms so as ready for discharge  Short Term Goals: Ability to identify changes in lifestyle to reduce recurrence of condition will improve, Ability to verbalize feelings will improve, Ability to disclose and discuss suicidal ideas, Ability to demonstrate self-control will improve, Ability to identify and develop effective coping behaviors will improve, Ability to maintain clinical measurements within normal limits will improve and Compliance with prescribed medications will improve  Physician Treatment Plan for Secondary Diagnosis: Principal Problem:   MDD (major depressive disorder), severe (  HCC) Active Problems:   Social anxiety disorder   Paranoid schizophrenia (HCC)  Long Term Goal(s): Improvement in symptoms so as ready for discharge  Short Term Goals: Ability to identify changes in lifestyle to reduce recurrence of condition will improve, Ability to verbalize feelings will improve, Ability to disclose and discuss suicidal ideas, Ability to demonstrate self-control will improve, Ability to identify and develop effective coping behaviors will improve, Ability to maintain clinical measurements within normal limits will improve and Compliance with prescribed medications will improve  I certify that inpatient services furnished can reasonably be expected to improve the patient's condition.    Antonieta Pert, MD 2/27/202012:29 PM

## 2018-04-09 NOTE — H&P (Signed)
Psychiatric Admission Assessment Adult  Patient Identification: David Dennis MRN:  [id number] Date of Evaluation:  04/09/2018 Chief Complaint:  MDD Principal Diagnosis: MDD (major depressive disorder), severe (HCC) Diagnosis:  Principal Problem:   MDD (major depressive disorder), severe (HCC) Active Problems:   Social anxiety disorder   Paranoid schizophrenia (HCC)  History of Present Illness: David Dennis is a 53 y.o. male who presented to [facility] after an intentional overdose of "1/2 bottle venlaxafine, a month supply of olanzapine, metoprolol, and clonazepam." Patient reports at the time of the suicide attempt he was feeling extremely isolated and alone. States that there was no specific event that led to the attempt, but rather a culmination of dealing with his emotions for 53 years. Reports that when he was 24, he filled out an application to purchase a gun that he was going to use to kill himself. States that during the waiting period he told someone about his plan and was subsequently hospitalized in [place]. Reports two other inpatient hospitalizations. Patient reports a history of parnaoid schizophrenia and that he was receiving outpatient treatment at the [facility]. States the he received an honorable discharge from the [employer] after 3.5 years service. Reports that he has always had "mental problems" and probably should have told someone in highschool. States that things became worse after the [employer]. Reports that when he was in college at the [employer] [place] that one of his professors used to stare at him in class and that he felt she was speaking specifically to him. States that he would look for hidden meaning in what she was saying. Apparently there was some sort of confrontation  between the professor and him, but he was not clear on what happened. States that he always looks for hidden messages when people talk to him and perceives threats. Reports that he feels that his  neighbors who live about a 1/2 football field away can hear him talking and they respond by coughing and making other noises. Denies any audiovisual hallucinations. States that he stopped drinking alcohol the first week of November. Reports a history of withdrawal seizures 5 years ago after abruptly stopping alcohol and clonazepam at the same time. Denies any other history of seizures. States that he lives with his mother and that they have a good relationship. States that he feels guilty about his mother finding him after the overdose.   On exam patient is alert and oriented x 3, pleasant, and cooperative. Speech is clear and coherent, normal pace, and volume. Mood is depressed and anxious and affect is congruent with mood. Patient was able to calculate serial 7's and perform multiplications without any difficulty. Patient had difficulty remembering three items after a period of time. He was only able to name one item, with prompting. Patient states that he is now willing to take medications. States "I was just rebelling."   Associated Signs/Symptoms: Depression Symptoms:  depressed mood, anhedonia, insomnia, feelings of worthlessness/guilt, hopelessness, recurrent thoughts of death, suicidal attempt, anxiety, (Hypo) Manic Symptoms:  Distractibility, Impulsivity, Anxiety Symptoms:  Excessive Worry, Social Anxiety, Psychotic Symptoms:  Ideas of Reference, Paranoia, PTSD Symptoms: Denies history of witnessed trauma Total Time spent with patient: 1 hour  Past Psychiatric History: Patient reports history of paranoid schizophrenia  Is the patient at risk to self? Yes.    Has the patient been a risk to self in the past 6 months? Yes.    Has the patient been a risk to self within the distant past?  Yes.    Is the patient a risk to others? No.  Has the patient been a risk to others in the past 6 months? No.  Has the patient been a risk to others within the distant past? No.   Prior Inpatient  Therapy:   Prior Outpatient Therapy:    Alcohol Screening: 1. How often do you have a drink containing alcohol?: 4 or more times a week 2. How many drinks containing alcohol do you have on a typical day when you are drinking?: 5 or 6 3. How often do you have six or more drinks on one occasion?: Daily or almost daily AUDIT-C Score: 10 4. How often during the last year have you found that you were not able to stop drinking once you had started?: Daily or almost daily 5. How often during the last year have you failed to do what was normally expected from you becasue of drinking?: Weekly 6. How often during the last year have you needed a first drink in the morning to get yourself going after a heavy drinking session?: Weekly 7. How often during the last year have you had a feeling of guilt of remorse after drinking?: Weekly 8. How often during the last year have you been unable to remember what happened the night before because you had been drinking?: Weekly 9. Have you or someone else been injured as a result of your drinking?: No 10. Has a relative or friend or a doctor or another health worker been concerned about your drinking or suggested you cut down?: Yes, during the last year Alcohol Use Disorder Identification Test Final Score (AUDIT): 30 Alcohol Brief Interventions/Follow-up: Alcohol Education Substance Abuse History in the last 12 months:  Yes.   Consequences of Substance Abuse: Withdrawal Symptoms:   seizures Previous Psychotropic Medications: Yes  Psychological Evaluations: Yes  Past Medical History:  Past Medical History:  Diagnosis Date  . Depression   . Generalized anxiety disorder   . Hypertension    History reviewed. No pertinent surgical history. Family History: History reviewed. No pertinent family history. Family Psychiatric  History: Reports that he is unaware Tobacco Screening: Have you used any form of tobacco in the last 30 days? (Cigarettes, Smokeless Tobacco,  Cigars, and/or Pipes): Yes Tobacco use, Select all that apply: 4 or less cigarettes per day Are you interested in Tobacco Cessation Medications?: No, patient refused Counseled patient on smoking cessation including recognizing danger situations, developing coping skills and basic information about quitting provided: Refused/Declined practical counseling Social History:  Social History   Substance and Sexual Activity  Alcohol Use Yes  . Alcohol/week: 84.0 standard drinks  . Types: 84 Cans of beer per week   Comment: "about a 12 pack of beer everyday"     Social History   Substance and Sexual Activity  Drug Use No    Additional Social History:                           Allergies:  No Known Allergies Lab Results:  Results for orders placed or performed during the hospital encounter of 04/01/18 (from the past 48 hour(s))  Glucose, capillary     Status: Abnormal   Collection Time: 04/07/18 11:57 AM  Result Value Ref Range   Glucose-Capillary 144 (H) 70 - 99 mg/dL  Glucose, capillary     Status: Abnormal   Collection Time: 04/07/18  4:57 PM  Result Value Ref Range   Glucose-Capillary 109 (  H) 70 - 99 mg/dL  Glucose, capillary     Status: Abnormal   Collection Time: 04/07/18  8:23 PM  Result Value Ref Range   Glucose-Capillary 126 (H) 70 - 99 mg/dL  Glucose, capillary     Status: None   Collection Time: 04/08/18 12:08 AM  Result Value Ref Range   Glucose-Capillary 90 70 - 99 mg/dL  Comprehensive metabolic panel     Status: Abnormal   Collection Time: 04/08/18  3:32 AM  Result Value Ref Range   Sodium 139 135 - 145 mmol/L   Potassium 3.7 3.5 - 5.1 mmol/L   Chloride 104 98 - 111 mmol/L   CO2 21 (L) 22 - 32 mmol/L   Glucose, Bld 93 70 - 99 mg/dL   BUN 53 (H) 6 - 20 mg/dL   Creatinine, Ser 0.92 (H) 0.61 - 1.24 mg/dL   Calcium 9.0 8.9 - 33.0 mg/dL   Total Protein 6.5 6.5 - 8.1 g/dL   Albumin 3.4 (L) 3.5 - 5.0 g/dL   AST 71 (H) 15 - 41 U/L   ALT 881 (H) 0 - 44 U/L    Alkaline Phosphatase 112 38 - 126 U/L   Total Bilirubin 1.0 0.3 - 1.2 mg/dL   GFR calc non Af Amer 40 (L) >60 mL/min   GFR calc Af Amer 47 (L) >60 mL/min   Anion gap 14 5 - 15    Comment: Performed at [facility], [address], [place], [place] [address]  CBC     Status: Abnormal   Collection Time: 04/08/18  3:32 AM  Result Value Ref Range   WBC 10.7 (H) 4.0 - 10.5 K/uL   RBC 3.57 (L) 4.22 - 5.81 MIL/uL   Hemoglobin 11.8 (L) 13.0 - 17.0 g/dL   HCT 63.3 (L) 35.4 - 56.2 %   MCV 94.1 80.0 - 100.0 fL   MCH 33.1 26.0 - 34.0 pg   MCHC 35.1 30.0 - 36.0 g/dL   RDW 56.3 89.3 - 73.4 %   Platelets 86 (L) 150 - 400 K/uL    Comment: REPEATED TO VERIFY Immature Platelet Fraction may be clinically indicated, consider ordering this additional test [id number] CONSISTENT WITH PREVIOUS RESULT    nRBC 0.0 0.0 - 0.2 %    Comment: Performed at [facility], [address], [place], [place] [address]  Glucose, capillary     Status: Abnormal   Collection Time: 04/08/18  4:03 AM  Result Value Ref Range   Glucose-Capillary 101 (H) 70 - 99 mg/dL  Glucose, capillary     Status: Abnormal   Collection Time: 04/08/18  8:05 AM  Result Value Ref Range   Glucose-Capillary 122 (H) 70 - 99 mg/dL  Glucose, capillary     Status: Abnormal   Collection Time: 04/08/18 12:24 PM  Result Value Ref Range   Glucose-Capillary 114 (H) 70 - 99 mg/dL    Blood Alcohol level:  Lab Results  Component Value Date   ETH <10 04/01/2018   ETH <5 08/10/2015    Metabolic Disorder Labs:  No results found for: HGBA1C, MPG No results found for: PROLACTIN No results found for: CHOL, TRIG, HDL, CHOLHDL, VLDL, LDLCALC  Current Medications: Current Facility-Administered Medications  Medication Dose Route Frequency Provider Last Rate Last Dose  . carvedilol (COREG) tablet 12.5 mg  12.5 mg Oral BID WC Antonieta Pert, MD      . hydrOXYzine (ATARAX/VISTARIL) tablet 25 mg  25 mg Oral Q6H PRN Melvenia Beam,  Spencer E,  PA-C   25 mg at 04/09/18 0757  . levETIRAcetam (KEPPRA) tablet 500 mg  500 mg Oral BID Antonieta Pert, MD      . OLANZapine [facility]) tablet 20 mg  20 mg Oral QHS Antonieta Pert, MD      . venlafaxine XR [facility]) 24 hr capsule 150 mg  150 mg Oral Q breakfast Jola Babinski Marlane Mingle, MD       PTA Medications: Medications Prior to Admission  Medication Sig Dispense Refill Last Dose  . carvedilol (COREG) 6.25 MG tablet Take 1 tablet (6.25 mg total) by mouth 2 (two) times daily with a meal.     . feeding supplement, ENSURE ENLIVE, (ENSURE ENLIVE) LIQD Take 237 mLs by mouth 2 (two) times daily between meals. 237 mL 12   . folic acid (FOLVITE) 1 MG tablet Take 1 tablet (1 mg total) by mouth at bedtime.     . gabapentin (NEURONTIN) 100 MG capsule Take 1 capsule (100 mg total) by mouth 2 (two) times daily.     Marland Kitchen levETIRAcetam (KEPPRA) 500 MG tablet Take 1 tablet (500 mg total) by mouth 2 (two) times daily. 60 tablet 0   . Multiple Vitamins-Minerals (MULTIVITAL PO) Take 1 tablet by mouth daily.   november  . QUEtiapine (SEROQUEL) 25 MG tablet Take 1 tablet (25 mg total) by mouth daily as needed (at bedtime for sleep).     . thiamine 100 MG tablet Take 1 tablet (100 mg total) by mouth at bedtime.       Musculoskeletal: Strength & Muscle Tone: within normal limits Gait & Station: normal   Psychiatric Specialty Exam: Physical Exam  Constitutional: He is oriented to person, place, and time. He appears well-developed and well-nourished. No distress.  HENT:  Head: Normocephalic and atraumatic.  Respiratory: Effort normal. No respiratory distress.  Musculoskeletal: Normal range of motion.  Neurological: He is alert and oriented to person, place, and time.  Skin: He is not diaphoretic.  Psychiatric: His mood appears anxious. Thought content is paranoid. Thought content is not delusional. He exhibits a depressed mood. He expresses no homicidal and no suicidal ideation.    Review of Systems   Constitutional: Negative for chills, diaphoresis, fever and malaise/fatigue.  Respiratory: Negative for shortness of breath.   Cardiovascular: Negative for chest pain.  Gastrointestinal: Negative for diarrhea, nausea and vomiting.  Psychiatric/Behavioral: Positive for depression and suicidal ideas. Negative for hallucinations, memory loss and substance abuse. The patient is nervous/anxious and has insomnia.     Blood pressure (!) 144/100, pulse 69, temperature 99.4 F (37.4 C), temperature source Oral, resp. rate 18, height 6' (1.829 m), weight 68 kg, SpO2 97 %.Body mass index is 20.34 kg/m.  General Appearance: Casual and Fairly Groomed  Eye Contact:  Good  Speech:  Clear and Coherent and Normal Rate  Volume:  Normal  Mood:  Anxious and Depressed  Affect:  Congruent  Thought Process:  Coherent, Goal Directed and Descriptions of Associations: Loose  Orientation:  Full (Time, Place, and Person)  Thought Content:  Logical, Hallucinations: Auditory None and Ideas of Reference:   Paranoia  Suicidal Thoughts:  No  Homicidal Thoughts:  No  Memory:  Immediate;   Fair Recent;   Fair Remote;   Fair  Judgement:  Fair  Insight:  Fair  Psychomotor Activity:  Normal  Concentration:  Concentration: Fair and Attention Span: Fair  Recall:  Fair  Fund of Knowledge:  Good  Language:  Good  Akathisia:  No  Handed:  Right  AIMS (if indicated):     Assets:  Communication Skills Desire for Improvement Financial Resources/Insurance Housing Leisure Time Physical Health  ADL's:  Intact  Cognition:  WNL  Sleep:  Number of Hours: 5.75    Treatment Plan Summary: Daily contact with patient to assess and evaluate symptoms and progress in treatment and Medication management  Observation Level/Precautions:  15 minute checks  Psychotherapy:  Group  Medications:   Continue Zyprexa 20 mg QHS for paranoia Continue Venlaxafine 150 mg daily for depression Continue Vistaril 25 mg every 6 hours prn  anxiety Continue Keppra 500 mg BID for seizures Coreg 12.5 mg BID hypertension   Consultations:  As needed  Discharge Concerns:    Estimated LOS: 5-7 days  Other:     Physician Treatment Plan for Primary Diagnosis: <principal problem not specified> Long Term Goal(s): Improvement in symptoms so as ready for discharge  Short Term Goals: Ability to identify changes in lifestyle to reduce recurrence of condition will improve, Ability to verbalize feelings will improve, Ability to disclose and discuss suicidal ideas, Ability to demonstrate self-control will improve, Ability to identify and develop effective coping behaviors will improve, Ability to maintain clinical measurements within normal limits will improve and Compliance with prescribed medications will improve  Physician Treatment Plan for Secondary Diagnosis: Active Problems:   MDD (major depressive disorder), severe (HCC)  Long Term Goal(s): Improvement in symptoms so as ready for discharge  Short Term Goals: Ability to identify changes in lifestyle to reduce recurrence of condition will improve, Ability to verbalize feelings will improve, Ability to disclose and discuss suicidal ideas, Ability to demonstrate self-control will improve, Ability to identify and develop effective coping behaviors will improve, Compliance with prescribed medications will improve and Ability to identify triggers associated with substance abuse/mental health issues will improve  I certify that inpatient services furnished can reasonably be expected to improve the patient's condition.    Jackelyn Poling, NP 2/27/202011:33 AM

## 2018-04-09 NOTE — Progress Notes (Signed)
Nursing Progress Note: 7p-7a D: Pt currently presents with a anxious/guarded affect and behavior. Pt states "I haven't ever slept... too anxious. I don't ever expect to sleep." Interacting appropriately with the milieu. Pt reports no sleep during the previous night with current medication regimen. Pt did attend wrap-up group.  A: Pt provided with medications per providers orders. Pt's labs and vitals were monitored throughout the night. Pt supported emotionally and encouraged to express concerns and questions. Pt educated on medications.  R: Pt's safety ensured with 15 minute and environmental checks. Pt currently denies SI, HI, and AVH. Pt verbally contracts to seek staff if SI,HI, or AVH occurs and to consult with staff before acting on any harmful thoughts. Will continue to monitor.

## 2018-04-09 NOTE — Plan of Care (Signed)
  Problem: Education: Goal: Knowledge of [place] [facility] Education information/materials will improve Outcome: Progressing   Problem: Health Behavior/Discharge Planning: Goal: Compliance with treatment plan for underlying cause of condition will improve Outcome: Progressing   Problem: Safety: Goal: Periods of time without injury will increase Outcome: Progressing   Problem: Safety: Goal: Ability to disclose and discuss suicidal ideas will improve Outcome: Progressing

## 2018-04-09 NOTE — Progress Notes (Signed)
Adult Psychoeducational Group Note  Date:  04/09/2018 Time:  9:05 PM  Group Topic/Focus:  Wrap-Up Group:   The focus of this group is to help patients review their daily goal of treatment and discuss progress on daily workbooks.  Participation Level:  Active  Participation Quality:  Appropriate  Affect:  Appropriate  Cognitive:  Alert  Insight: Appropriate  Engagement in Group:  Engaged  Modes of Intervention:  Discussion  Additional Comments:  Patient stated having a good day. Patient stated that he is a very shy person and is scared of people. Patient stated that his goal is to "identify some stuff to turn my life around".  Ahamed Hofland L Kymani Laursen 04/09/2018, 9:05 PM

## 2018-04-09 NOTE — Progress Notes (Signed)
Patient ID: David Dennis, male   DOB: January 15, 1966, 53 y.o.   MRN: [id number]  Nursing Progress Note [id number]  On initial approach, patient is seen resting in his room. Patient presents with flat affect and sad/sullen mood. Patient reports he regrets his suicide attempt and states, "I was selfish, it would only make me feel better. It wasn't fair to my folks, especially my mom who was the one who checked me on. That wasn't nice for her". Patient appears disheveled and malnourished. Patient reports his appetite has been poor and states, "I've been dealing with all this for a long time. It just got to be too much". Patient agreeable to EKG and is compliant with medications. Patient currently denies SI/HI/AVH or pain. Patient is observed withdrawn in the milieu and does not interact much. Patient does appear steady in gait this morning. BP is elevated; MD notified. Patient with history of HTN and states, "it's been high for a few days". New orders received.  Patient is educated about and provided medication per provider's orders. Patient safety maintained with q15 min safety checks and high fall risk precautions. Emotional support given, 1:1 interaction, and active listening provided. Patient encouraged to attend meals, groups, and work on treatment plan and goals. Labs, vital signs and patient behavior monitored throughout shift. EKG performed and reviewed by provider.   Patient contracts for safety with staff. Patient remains safe on the unit at this time and agrees to come to staff with any issues/concerns. Will continue to support and monitor.   Patient's self-inventory sheet Rated Energy Level  High  Rated Sleep  Poor  Rated Appetite  Good  Rated Anxiety (0-10)  8  Rated Hopelessness (0-10)  8  Rated Depression (0-10)  8  Daily Goal  "look to improve, listen and think"  Any Additional Comments:  "Thank you (to staff)"

## 2018-04-10 MED ORDER — ACETAMINOPHEN 325 MG PO TABS: 650.0000 mg | ORAL_TABLET | Freq: Four times a day (QID) | ORAL | Status: DC | PRN

## 2018-04-10 MED ORDER — CARVEDILOL 25 MG PO TABS
25.0000 mg | ORAL_TABLET | Freq: Two times a day (BID) | ORAL | Status: DC
  Administered 2018-04-10 – 2018-04-21 (×22): 25 mg via ORAL
  Filled 2018-04-10: qty 14
  Filled 2018-04-10 (×6): qty 1
  Filled 2018-04-10: qty 2
  Filled 2018-04-10 (×17): qty 1
  Filled 2018-04-10: qty 14
  Filled 2018-04-10: qty 1
  Filled 2018-04-10: qty 2
  Filled 2018-04-10 (×2): qty 1

## 2018-04-10 MED ORDER — OXCARBAZEPINE 150 MG PO TABS
150.0000 mg | ORAL_TABLET | Freq: Every day | ORAL | Status: DC
  Administered 2018-04-10: 150 mg via ORAL
  Filled 2018-04-10 (×3): qty 1

## 2018-04-10 MED ORDER — MAGNESIUM HYDROXIDE 400 MG/5ML PO SUSP: 5.0000 mL | Freq: Every evening | ORAL | Status: DC | PRN

## 2018-04-10 NOTE — Plan of Care (Signed)
  Problem: Activity: Goal: Interest or engagement in activities will improve Outcome: Not Progressing   Problem: Safety: Goal: Periods of time without injury will increase Outcome: Progressing  DAR NOTE: Patient presents with anxious affect and depressed mood.  Denies suicidal thoughts, pain, auditory and visual hallucinations.  Rates depression at 3, hopelessness at 5, and anxiety at 5.  Maintained on routine safety checks.  Medications given as prescribed.  Support and encouragement offered as needed.  Attended group and participated.  States goal for today is "staying balanced ( not being overwhelmed with anxiety and depression)."  Patient visible in milieu briefly for groups.  Requested and received Vistaril 25 mg for complain of anxiety with good effect.  Patient is safe on and off the unit.

## 2018-04-10 NOTE — Progress Notes (Signed)
Recreation Therapy Notes  Date:  2.28.20 Time: 0930 Location: [address] Dayroom  Group Topic: Stress Management  Goal Area(s) Addresses:  Patient will identify positive stress management techniques. Patient will identify benefits of using stress management post d/c.  Intervention: Stress Management  Activity :  Progressive Muscle Relaxation.  LRT introduced the stress management technique of stress management.  LRT read a script that focused on tensing and relaxing each muscle group individually.  Patients were to follow along as the script was read to engage in activity.  Education:  Stress Management, Discharge Planning.   Education Outcome: Acknowledges Education  Clinical Observations/Feedback:  Pt did not attend group.    David Dennis, LRT/CTRS        David Dennis A 04/10/2018 10:56 AM 

## 2018-04-10 NOTE — [facility] Group Notes (Signed)
Pt did not attend wrap up group this evening.  

## 2018-04-10 NOTE — [facility] Group Notes (Signed)
[facility] LCSW Group Therapy Note  Date/Time  Type of Therapy/Topic:  Group Therapy:  Feelings about Diagnosis  Participation Level:  Minimal   Mood: Anxious   Description of Group:    This group will allow patients to explore their thoughts and feelings about diagnoses they have received. Patients will be guided to explore their level of understanding and acceptance of these diagnoses. Facilitator will encourage patients to process their thoughts and feelings about the reactions of others to their diagnosis, and will guide patients in identifying ways to discuss their diagnosis with significant others in their lives. This group will be process-oriented, with patients participating in exploration of their own experiences as well as giving and receiving support and challenge from other group members.   Therapeutic Goals: 1. Patient will demonstrate understanding of diagnosis as evidence by identifying two or more symptoms of the disorder:  2. Patient will be able to express two feelings regarding the diagnosis 3. Patient will demonstrate ability to communicate their needs through discussion and/or role plays  Summary of Patient Progress: David Dennis, "Will", attended the entire session. He shared that it is hard to participate due to his anxiety. He did state that his mental health is more important to take care of because it allows him to take better care of his physical health.    Therapeutic Modalities:   Cognitive Behavioral Therapy Brief Therapy Feelings Identification   Marian Sorrow, MSW Intern 04/10/2018 4:15 PM

## 2018-04-10 NOTE — [facility] Group Notes (Deleted)
[facility] LCSW Group Therapy Note  Date/Time 04/10/2018 4:07 PM  Type of Therapy/Topic:  Group Therapy:  Feelings about Diagnosis  Participation Level:  Did not attend   Therapeutic Modalities:   Cognitive Behavioral Therapy Brief Therapy Feelings Identification   Marian Sorrow, MSW Intern 04/10/2018 4:15 PM

## 2018-04-10 NOTE — Tx Team (Signed)
Interdisciplinary Treatment and Diagnostic Plan Update  04/10/2018 Time of Session:  David Dennis MRN: [id number]  Principal Diagnosis: MDD (major depressive disorder), severe ([place])  Secondary Diagnoses: Principal Problem:   MDD (major depressive disorder), severe ([place]) Active Problems:   Social anxiety disorder   Paranoid schizophrenia ([place])   Current Medications:  Current Facility-Administered Medications  Medication Dose Route Frequency Provider Last Rate Last Dose  . carvedilol (COREG) tablet 12.5 mg  12.5 mg Oral BID WC Sharma Covert, MD   12.5 mg at 04/10/18 4270  . hydrOXYzine (ATARAX/VISTARIL) tablet 25 mg  25 mg Oral Q6H PRN Laverle Hobby, PA-C   25 mg at 04/09/18 2203  . levETIRAcetam (KEPPRA) tablet 500 mg  500 mg Oral BID Sharma Covert, MD   500 mg at 04/10/18 0809  . OLANZapine (ZYPREXA) tablet 20 mg  20 mg Oral QHS Sharma Covert, MD   20 mg at 04/09/18 2203  . venlafaxine XR (EFFEXOR-XR) 24 hr capsule 150 mg  150 mg Oral Q breakfast Sharma Covert, MD   150 mg at 04/10/18 6237   PTA Medications: Medications Prior to Admission  Medication Sig Dispense Refill Last Dose  . carvedilol (COREG) 6.25 MG tablet Take 1 tablet (6.25 mg total) by mouth 2 (two) times daily with a meal.     . feeding supplement, ENSURE ENLIVE, (ENSURE ENLIVE) LIQD Take 237 mLs by mouth 2 (two) times daily between meals. 628 mL 12   . folic acid (FOLVITE) 1 MG tablet Take 1 tablet (1 mg total) by mouth at bedtime.     . gabapentin (NEURONTIN) 100 MG capsule Take 1 capsule (100 mg total) by mouth 2 (two) times daily.     Marland Kitchen levETIRAcetam (KEPPRA) 500 MG tablet Take 1 tablet (500 mg total) by mouth 2 (two) times daily. 60 tablet 0   . Multiple Vitamins-Minerals (MULTIVITAL PO) Take 1 tablet by mouth daily.   november  . QUEtiapine (SEROQUEL) 25 MG tablet Take 1 tablet (25 mg total) by mouth daily as needed (at bedtime for sleep).     . thiamine 100 MG tablet Take 1 tablet  (100 mg total) by mouth at bedtime.       Patient Stressors: Medication change or noncompliance Substance abuse  Patient Strengths: Agricultural engineer for treatment/growth Supportive family/friends  Treatment Modalities: Medication Management, Group therapy, Case management,  1 to 1 session with clinician, Psychoeducation, Recreational therapy.   Physician Treatment Plan for Primary Diagnosis: MDD (major depressive disorder), severe ([place]) Long Term Goal(s): Improvement in symptoms so as ready for discharge Improvement in symptoms so as ready for discharge   Short Term Goals: Ability to identify changes in lifestyle to reduce recurrence of condition will improve Ability to verbalize feelings will improve Ability to disclose and discuss suicidal ideas Ability to demonstrate self-control will improve Ability to identify and develop effective coping behaviors will improve Ability to maintain clinical measurements within normal limits will improve Compliance with prescribed medications will improve Ability to identify changes in lifestyle to reduce recurrence of condition will improve Ability to verbalize feelings will improve Ability to disclose and discuss suicidal ideas Ability to demonstrate self-control will improve Ability to identify and develop effective coping behaviors will improve Ability to maintain clinical measurements within normal limits will improve Compliance with prescribed medications will improve  Medication Management: Evaluate patient's response, side effects, and tolerance of medication regimen.  Therapeutic Interventions: 1 to 1 sessions, Unit Group sessions and Medication  administration.  Evaluation of Outcomes: Not Met  Physician Treatment Plan for Secondary Diagnosis: Principal Problem:   MDD (major depressive disorder), severe ([place]) Active Problems:   Social anxiety disorder   Paranoid schizophrenia ([place])  Long Term Goal(s):  Improvement in symptoms so as ready for discharge Improvement in symptoms so as ready for discharge   Short Term Goals: Ability to identify changes in lifestyle to reduce recurrence of condition will improve Ability to verbalize feelings will improve Ability to disclose and discuss suicidal ideas Ability to demonstrate self-control will improve Ability to identify and develop effective coping behaviors will improve Ability to maintain clinical measurements within normal limits will improve Compliance with prescribed medications will improve Ability to identify changes in lifestyle to reduce recurrence of condition will improve Ability to verbalize feelings will improve Ability to disclose and discuss suicidal ideas Ability to demonstrate self-control will improve Ability to identify and develop effective coping behaviors will improve Ability to maintain clinical measurements within normal limits will improve Compliance with prescribed medications will improve     Medication Management: Evaluate patient's response, side effects, and tolerance of medication regimen.  Therapeutic Interventions: 1 to 1 sessions, Unit Group sessions and Medication administration.  Evaluation of Outcomes: Not Met   RN Treatment Plan for Primary Diagnosis: MDD (major depressive disorder), severe ([place]) Long Term Goal(s): Knowledge of disease and therapeutic regimen to maintain health will improve  Short Term Goals: Ability to participate in decision making will improve, Ability to verbalize feelings will improve, Ability to disclose and discuss suicidal ideas, Ability to identify and develop effective coping behaviors will improve and Compliance with prescribed medications will improve  Medication Management: RN will administer medications as ordered by provider, will assess and evaluate patient's response and provide education to patient for prescribed medication. RN will report any adverse and/or side effects  to prescribing provider.  Therapeutic Interventions: 1 on 1 counseling sessions, Psychoeducation, Medication administration, Evaluate responses to treatment, Monitor vital signs and CBGs as ordered, Perform/monitor CIWA, COWS, AIMS and Fall Risk screenings as ordered, Perform wound care treatments as ordered.  Evaluation of Outcomes: Not Met   LCSW Treatment Plan for Primary Diagnosis: MDD (major depressive disorder), severe ([place]) Long Term Goal(s): Safe transition to appropriate next level of care at discharge, Engage patient in therapeutic group addressing interpersonal concerns.  Short Term Goals: Engage patient in aftercare planning with referrals and resources  Therapeutic Interventions: Assess for all discharge needs, 1 to 1 time with Social worker, Explore available resources and support systems, Assess for adequacy in community support network, Educate family and significant other(s) on suicide prevention, Complete Psychosocial Assessment, Interpersonal group therapy.  Evaluation of Outcomes: Not Met   Progress in Treatment: Attending groups: Yes. Participating in groups: Yes. Minimally  Taking medication as prescribed: Yes. Toleration medication: Yes. Family/Significant other contact made: No, will contact:  the patient's mother. Patient understands diagnosis: Yes. Discussing patient identified problems/goals with staff: Yes. Medical problems stabilized or resolved: Yes. Denies suicidal/homicidal ideation: Yes. Issues/concerns per patient self-inventory: No. Other:   New problem(s) identified: None   New Short Term/Long Term Goal(s): medication stabilization, elimination of SI thoughts, development of comprehensive mental wellness plan.   Patient Goals:    Discharge Plan or Barriers: Patient lives at home with his mother in [place], [place]. He reports he follows up with a psychiatrist at the [facility] once, every three months. Patient is considering following up with the  [facility] for outpatient medication management and therapy services at  discharge. CSW will continue to follow and assess for appro  Reason for Continuation of Hospitalization: Anxiety Depression Medication stabilization Suicidal ideation  Estimated Length of Stay: 04/15/2018  Attendees: Patient: 04/10/2018 10:52 AM  Physician: Dr. Myles Lipps, MD 04/10/2018 10:52 AM  Nursing: Benjamine Mola.O,RN 04/10/2018 10:52 AM  RN Care Manager: 04/10/2018 10:52 AM  Social Worker: Radonna Ricker, [place] 04/10/2018 10:52 AM  Recreational Therapist:  04/10/2018 10:52 AM  Other: Harriett Sine, NP  04/10/2018 10:52 AM  Other:  04/10/2018 10:52 AM  Other: 04/10/2018 10:52 AM    Scribe for Treatment Team: Marylee Floras, [place] 04/10/2018 10:52 AM

## 2018-04-10 NOTE — Progress Notes (Signed)
[facility] MD Progress Note  04/10/2018 12:17 PM David Dennis  MRN:  [id number] Subjective:  Patient is a 53 year old male with a reported past psychiatric history significant for depression, anxiety and schizophrenia who originally presented to the [facility] emergency department on 04/01/2018 after an intentional overdose.   Objective: Patient is seen and examined.  Patient is a 53 year old male with the above-stated past psychiatric history who is seen in follow-up.  He stated he is doing better today.  He stated that his paranoid thoughts are decreasing.  He slept better last night, but still having difficulty with sleep.  We discussed the fact that he had been off his regular medication since November 2019.  His blood pressure is still elevated with a blood pressure of 135/100.  His pulse is 95.  He is afebrile.  As stated above he slept 4.75 hours last night.  With regard to his abnormal laboratories previously including an elevated creatinine, elevated liver function enzymes, the plan is to repeat these tomorrow morning.  We also will recheck his BNP.  We attempted to contact his physician at the [facility], but have not heard back from them yet.  Principal Problem: MDD (major depressive disorder), severe ([facility]) Diagnosis: Principal Problem:   MDD (major depressive disorder), severe ([facility]) Active Problems:   Social anxiety disorder   Paranoid schizophrenia ([facility])  Total Time spent with patient: 15 minutes  Past Psychiatric History: See admission H&P  Past Medical History:  Past Medical History:  Diagnosis Date  . Depression   . Generalized anxiety disorder   . Hypertension    History reviewed. No pertinent surgical history. Family History: History reviewed. No pertinent family history. Family Psychiatric  History: [facility] Social History:  Social History   Substance and Sexual Activity  Alcohol Use Yes  . Alcohol/week: 84.0 standard drinks  . Types: 84 Cans of beer per week   Comment: "about a 12 pack of beer everyday"     Social History   Substance and Sexual Activity  Drug Use No    Social History   Socioeconomic History  . Marital status: Single    Spouse name: Not on file  . Number of children: Not on file  . Years of education: Not on file  . Highest education level: Not on file  Occupational History  . Not on file  Social Needs  . Financial resource strain: Not on file  . Food insecurity:    Worry: Not on file    Inability: Not on file  . Transportation needs:    Medical: Not on file    Non-medical: Not on file  Tobacco Use  . Smoking status: Former Smoker    Last attempt to quit: 04/01/2018    Years since quitting: 0.0  . Smokeless tobacco: Never Used  Substance and Sexual Activity  . Alcohol use: Yes    Alcohol/week: 84.0 standard drinks    Types: 84 Cans of beer per week    Comment: "about a 12 pack of beer everyday"  . Drug use: No  . Sexual activity: Not on file  Lifestyle  . Physical activity:    Days per week: Not on file    Minutes per session: Not on file  . Stress: Not on file  Relationships  . Social connections:    Talks on phone: Not on file    Gets together: Not on file    Attends religious service: Not on file    Active member  of club or organization: Not on file    Attends meetings of clubs or organizations: Not on file    Relationship status: Not on file  Other Topics Concern  . Not on file  Social History Narrative  . Not on file   Additional Social History:                         Sleep: Fair  Appetite:  Fair  Current Medications: Current Facility-Administered Medications  Medication Dose Route Frequency Provider Last Rate Last Dose  . carvedilol (COREG) tablet 12.5 mg  12.5 mg Oral BID WC Antonieta Pertlary, Joshawa Dubin Lawson, MD   12.5 mg at 04/10/18 [id number]  . hydrOXYzine (ATARAX/VISTARIL) tablet 25 mg  25 mg Oral Q6H PRN Kerry HoughSimon, Spencer E, PA-C   25 mg at 04/10/18 1203  . levETIRAcetam (KEPPRA) tablet 500  mg  500 mg Oral BID Antonieta Pertlary, Lucrecia Mcphearson Lawson, MD   500 mg at 04/10/18 0809  . OLANZapine (ZYPREXA) tablet 20 mg  20 mg Oral QHS Antonieta Pertlary, Jordan Caraveo Lawson, MD   20 mg at 04/09/18 2203  . venlafaxine XR (EFFEXOR-XR) 24 hr capsule 150 mg  150 mg Oral Q breakfast Antonieta Pertlary, Lachae Hohler Lawson, MD   150 mg at 04/10/18 [id number]    Lab Results:  Results for orders placed or performed during the hospital encounter of 04/08/18 (from the past 48 hour(s))  Glucose, capillary     Status: None   Collection Time: 04/09/18  5:04 PM  Result Value Ref Range   Glucose-Capillary 84 70 - 99 mg/dL   Comment 1 Notify RN    Comment 2 Document in Chart     Blood Alcohol level:  Lab Results  Component Value Date   ETH <10 04/01/2018   ETH <5 08/10/2015    Metabolic Disorder Labs: No results found for: HGBA1C, MPG No results found for: PROLACTIN No results found for: CHOL, TRIG, HDL, CHOLHDL, VLDL, LDLCALC  Physical Findings: AIMS:  , ,  ,  ,    CIWA:  CIWA-Ar Total: 2 COWS:     Musculoskeletal: Strength & Muscle Tone: within normal limits Gait & Station: normal Patient leans: N/A  Psychiatric Specialty Exam: Physical Exam  Nursing note and vitals reviewed. Constitutional: He is oriented to person, place, and time. He appears well-developed and well-nourished.  HENT:  Head: Normocephalic and atraumatic.  Respiratory: Effort normal.  Neurological: He is alert and oriented to person, place, and time.    ROS  Blood pressure (!) 135/100, pulse 95, temperature 99.4 F (37.4 C), temperature source Oral, resp. rate 18, height 6' (1.829 m), weight 68 kg, SpO2 97 %.Body mass index is 20.34 kg/m.  General Appearance: Casual  Eye Contact:  Fair  Speech:  Normal Rate  Volume:  Decreased  Mood:  Depressed  Affect:  Congruent  Thought Process:  Coherent and Descriptions of Associations: Intact  Orientation:  Full (Time, Place, and Person)  Thought Content:  Logical and Delusions  Suicidal Thoughts:  No  Homicidal Thoughts:   No  Memory:  Immediate;   Fair Recent;   Fair Remote;   Fair  Judgement:  Intact  Insight:  Fair  Psychomotor Activity:  Psychomotor Retardation  Concentration:  Concentration: Fair and Attention Span: Fair  Recall:  FiservFair  Fund of Knowledge:  Fair  Language:  Fair  Akathisia:  Negative  Handed:  Right  AIMS (if indicated):     Assets:  Desire for Improvement Financial Resources/Insurance Housing Leisure  Time Physical Health Resilience  ADL's:  Intact  Cognition:  WNL  Sleep:  Number of Hours: 4.75     Treatment Plan Summary: Daily contact with patient to assess and evaluate symptoms and progress in treatment, Medication management and Plan : Patient is seen and examined.  Patient is a 53 year old male with the above-stated past psychiatric history who is seen in follow-up.  His mood and psychotic symptoms are slowly improving.  He denied any suicidal ideation today.  He continues to have sleep difficulties.  We will continue the olanzapine at 20 mg p.o. nightly and the venlafaxine extended release at 150 mg p.o. daily.  I will add Trileptal 150 mg p.o. nightly and titrate that to see if it assist in mood stability and sleep.  No change in his carvedilol, hydroxyzine or Keppra.  We will repeat his liver function enzymes, electrolytes and BNP in the a.m. 1.  Increase carvedilol to 25 mg p.o. twice daily for hypertension. 2.  Continue hydroxyzine 25 mg p.o. every 6 hours as needed anxiety. 3.  Continue Keppra 500 mg p.o. twice daily for seizures and mood stability. 4.  Continue olanzapine 20 mg p.o. nightly for mood, psychosis and sleep. 5.  Add Trileptal 150 mg p.o. nightly and titrate for mood stability and sleep. 6.  Continue Effexor XR 150 mg p.o. daily for mood and anxiety. 7.  Repeat CMP and BNP in a.m. 8.  Disposition planning-in progress.  Antonieta Pert, MD 04/10/2018, 12:17 PM

## 2018-04-11 DIAGNOSIS — F200 Paranoid schizophrenia: Secondary | ICD-10-CM

## 2018-04-11 LAB — COMPREHENSIVE METABOLIC PANEL
ALT: 308 U/L — ABNORMAL HIGH (ref 0–44)
AST: 33 U/L (ref 15–41)
Albumin: 3.9 g/dL (ref 3.5–5.0)
Alkaline Phosphatase: 115 U/L (ref 38–126)
Anion gap: 8 (ref 5–15)
BUN: 31 mg/dL — ABNORMAL HIGH (ref 6–20)
CHLORIDE: 110 mmol/L (ref 98–111)
CO2: 22 mmol/L (ref 22–32)
Calcium: 9.2 mg/dL (ref 8.9–10.3)
Creatinine, Ser: 1.18 mg/dL (ref 0.61–1.24)
GFR calc Af Amer: >60 mL/min (ref >60)
GFR calc non Af Amer: >60 mL/min (ref >60)
GLUCOSE: 87 mg/dL (ref 70–99)
Potassium: 4.1 mmol/L (ref 3.5–5.1)
SODIUM: 140 mmol/L (ref 135–145)
Total Bilirubin: 0.4 mg/dL (ref 0.3–1.2)
Total Protein: 7 g/dL (ref 6.5–8.1)

## 2018-04-11 LAB — BRAIN NATRIURETIC PEPTIDE: B Natriuretic Peptide: 112.8 pg/mL — ABNORMAL HIGH (ref 0.0–100.0)

## 2018-04-11 MED ORDER — OXCARBAZEPINE 300 MG PO TABS
300.0000 mg | ORAL_TABLET | Freq: Every day | ORAL | Status: DC
  Administered 2018-04-11 – 2018-04-12 (×2): 300 mg via ORAL
  Filled 2018-04-11 (×4): qty 1

## 2018-04-11 NOTE — Progress Notes (Signed)
DAR NOTE: Patient presents with flat affect and depressed mood.  Pt has been isolative, did not attend the wrap up group. Pt came up for his meds, was concerned about his high BP. Denies pain, auditory and visual hallucinations. Maintained on routine safety checks.  Medications given as prescribed.  Support and encouragement offered as needed.  Will continue to monitor.

## 2018-04-11 NOTE — Plan of Care (Signed)
D: Patient presents calm and cooperative. He slept well last night, and received medication that was helpful. His appetite is good, energy normal and concentration good. He rates his depression and hopelessness 5/10 and anxiety 3/10. He denies physical symptoms or withdrawal complaints. Patient denies SI/HI/AVH.  A: Patient checked q15 min, and checks reviewed. Reviewed medication changes with patient and educated on side effects. Educated patient on importance of attending group therapy sessions and educated on several coping skills. Encouarged participation in milieu through recreation therapy and attending meals with peers. Support and encouragement provided. Fluids offered. R: Patient receptive to education on medications, and is medication compliant. Patient contracts for safety on the unit. Goal: "relax and think".  Problem: Education: Goal: Emotional status will improve Outcome: Progressing Goal: Mental status will improve Outcome: Progressing   Problem: Activity: Goal: Interest or engagement in activities will improve Outcome: Progressing Goal: Sleeping patterns will improve Outcome: Progressing

## 2018-04-11 NOTE — Progress Notes (Signed)
Adult Psychoeducational Group Note  Date:  04/11/2018 Time: 8:30am-9:30am  Group Topic/Focus:  Goals Group:   The focus of this group is to help patients establish daily goals to achieve during treatment and discuss how the patient can incorporate goal setting into their daily lives to aide in recovery.  Participation Level:  Active  Participation Quality:  Attentive  Affect:  Appropriate  Cognitive:  Alert, Appropriate and Oriented  Insight: None  Engagement in Group:  None  Modes of Intervention:  Discussion and Support  Additional Comments:  Patient did not participate in group.   Annye Asa 04/11/2018, 2:29 PM

## 2018-04-11 NOTE — [facility] Group Notes (Signed)
Adult Psychoeducational Group Note  Date:  04/11/2018 Time:  9:21 PM  Group Topic/Focus:  Wrap-Up Group:   The focus of this group is to help patients review their daily goal of treatment and discuss progress on daily workbooks.  Participation Level:  Active  Participation Quality:  Appropriate and Attentive  Affect:  Appropriate  Cognitive:  Alert and Appropriate  Insight: Appropriate and Good  Engagement in Group:  Engaged  Modes of Intervention:  Discussion and Education  Additional Comments:  Pt attended and participated in wrap up group this evening. Pt rated their day a 5/10, due to them having some "regression" in some thoughts that they were having. Pt is still working on their goal to relax by not focusing on stressful thoughts.   David Dennis 04/11/2018, 9:21 PM

## 2018-04-11 NOTE — Progress Notes (Signed)
[facility] MD Progress Note  04/11/2018 2:00 PM David Dennis  MRN:  [id number] Subjective:  It was a combination of things. I feel hopeless and depressed. My anxiety is not bad because I am taking my medicine. But lunch and breakfast trigger my anxiety, I am always defensive and damage control mode. I didn't really talk to my folks that much that day, but I did go and talk to them before I made up my mind. It was scary but I went through with it. Im afraid to be hurt and upset, and people are insensitive to my needs. Im saying if I were to drop dead from cardiac arrest I would be okay. I do want to get out of here.   Objective: Patient is a 53 year old male with a reported past psychiatric history significant for depression, anxiety and schizophrenia who originally presented to the [facility] emergency department on 04/01/2018 after an intentional overdose.   Patient is seen and examined.   He stated he is having a bad day and feels emotionally drained, and it is not healthy for him to walk around vulnerable. He states if he removes himself from these fears and weaknesses, he protects himself and doesn't feel like a recluse. He reports that his anxiety has improved with the use of the Effexor and reports improved sleep. He states he has had poor sleep for the past 3 months, due to rapid discontinuation of medications and frustration.  He reports sleeping well last night " first time I slept well. "  His blood pressure is still elevated with a blood pressure of 117/96.  His pulse is 104.  He is afebrile.     Principal Problem: Paranoid schizophrenia (HCC) Diagnosis: Principal Problem:   Paranoid schizophrenia (HCC) Active Problems:   Overdose   Social anxiety disorder   AKI (acute kidney injury) (HCC)   MDD (major depressive disorder), severe (HCC)  Total Time spent with patient: 30 minutes  Past Psychiatric History: Patient reports history of paranoid schizophrenia   Past Medical History:   Past Medical History:  Diagnosis Date  . Depression   . Generalized anxiety disorder   . Hypertension    History reviewed. No pertinent surgical history. Family History: History reviewed. No pertinent family history. Family Psychiatric  History: [facility] Social History:  Social History   Substance and Sexual Activity  Alcohol Use Yes  . Alcohol/week: 84.0 standard drinks  . Types: 84 Cans of beer per week   Comment: "about a 12 pack of beer everyday"     Social History   Substance and Sexual Activity  Drug Use No    Social History   Socioeconomic History  . Marital status: Single    Spouse name: Not on file  . Number of children: Not on file  . Years of education: Not on file  . Highest education level: Not on file  Occupational History  . Not on file  Social Needs  . Financial resource strain: Not on file  . Food insecurity:    Worry: Not on file    Inability: Not on file  . Transportation needs:    Medical: Not on file    Non-medical: Not on file  Tobacco Use  . Smoking status: Former Smoker    Last attempt to quit: 04/01/2018    Years since quitting: 0.0  . Smokeless tobacco: Never Used  Substance and Sexual Activity  . Alcohol use: Yes    Alcohol/week: 84.0 standard drinks  Types: 84 Cans of beer per week    Comment: "about a 12 pack of beer everyday"  . Drug use: No  . Sexual activity: Not on file  Lifestyle  . Physical activity:    Days per week: Not on file    Minutes per session: Not on file  . Stress: Not on file  Relationships  . Social connections:    Talks on phone: Not on file    Gets together: Not on file    Attends religious service: Not on file    Active member of club or organization: Not on file    Attends meetings of clubs or organizations: Not on file    Relationship status: Not on file  Other Topics Concern  . Not on file  Social History Narrative  . Not on file   Additional Social History:     Sleep: Fair  Appetite:   Fair  Current Medications: Current Facility-Administered Medications  Medication Dose Route Frequency Provider Last Rate Last Dose  . acetaminophen (TYLENOL) tablet 650 mg  650 mg Oral Q6H PRN Antonieta Pert, MD      . carvedilol (COREG) tablet 25 mg  25 mg Oral BID WC Antonieta Pert, MD   25 mg at 04/11/18 0804  . hydrOXYzine (ATARAX/VISTARIL) tablet 25 mg  25 mg Oral Q6H PRN Kerry Hough, PA-C   25 mg at 04/11/18 4098  . levETIRAcetam (KEPPRA) tablet 500 mg  500 mg Oral BID Antonieta Pert, MD   500 mg at 04/11/18 0804  . magnesium hydroxide (MILK OF MAGNESIA) suspension 5 mL  5 mL Oral QHS PRN Antonieta Pert, MD      . OLANZapine [facility]) tablet 20 mg  20 mg Oral QHS Antonieta Pert, MD   20 mg at 04/10/18 2143  . OXcarbazepine (TRILEPTAL) tablet 150 mg  150 mg Oral QHS Antonieta Pert, MD   150 mg at 04/10/18 2143  . venlafaxine XR (EFFEXOR-XR) 24 hr capsule 150 mg  150 mg Oral Q breakfast Antonieta Pert, MD   150 mg at 04/11/18 1191    Lab Results:  Results for orders placed or performed during the hospital encounter of 04/08/18 (from the past 48 hour(s))  Glucose, capillary     Status: None   Collection Time: 04/09/18  5:04 PM  Result Value Ref Range   Glucose-Capillary 84 70 - 99 mg/dL   Comment 1 Notify RN    Comment 2 Document in Chart   Comprehensive metabolic panel     Status: Abnormal   Collection Time: 04/11/18  6:10 AM  Result Value Ref Range   Sodium 140 135 - 145 mmol/L   Potassium 4.1 3.5 - 5.1 mmol/L   Chloride 110 98 - 111 mmol/L   CO2 22 22 - 32 mmol/L   Glucose, Bld 87 70 - 99 mg/dL   BUN 31 (H) 6 - 20 mg/dL   Creatinine, Ser 4.78 0.61 - 1.24 mg/dL   Calcium 9.2 8.9 - 29.5 mg/dL   Total Protein 7.0 6.5 - 8.1 g/dL   Albumin 3.9 3.5 - 5.0 g/dL   AST 33 15 - 41 U/L   ALT 308 (H) 0 - 44 U/L   Alkaline Phosphatase 115 38 - 126 U/L   Total Bilirubin 0.4 0.3 - 1.2 mg/dL   GFR calc non Af Amer >60 >60 mL/min   GFR calc Af Amer >60  >60 mL/min   Anion gap 8 5 -  15    Comment: Performed at [facility], [address], [place], [place] [address]  Brain natriuretic peptide     Status: Abnormal   Collection Time: 04/11/18  6:10 AM  Result Value Ref Range   B Natriuretic Peptide 112.8 (H) 0.0 - 100.0 pg/mL    Comment: Performed at [facility], [address], [place], [place] [address]    Blood Alcohol level:  Lab Results  Component Value Date   ETH <10 04/01/2018   ETH <5 08/10/2015    Metabolic Disorder Labs: No results found for: HGBA1C, MPG No results found for: PROLACTIN No results found for: CHOL, TRIG, HDL, CHOLHDL, VLDL, LDLCALC  Physical Findings: AIMS:  , ,  ,  ,    CIWA:  CIWA-Ar Total: 2 COWS:     Musculoskeletal: Strength & Muscle Tone: within normal limits Gait & Station: normal Patient leans: N/A  Psychiatric Specialty Exam: Physical Exam  Nursing note and vitals reviewed. Constitutional: He is oriented to person, place, and time. He appears well-developed and well-nourished.  HENT:  Head: Normocephalic and atraumatic.  Respiratory: Effort normal.  Neurological: He is alert and oriented to person, place, and time.    ROS   Blood pressure (!) 117/96, pulse 68, temperature 98.3 F (36.8 C), temperature source Oral, resp. rate 16, height 6' (1.829 m), weight 68 kg, SpO2 99 %.Body mass index is 20.34 kg/m.  General Appearance: Casual  Eye Contact:  Fair  Speech:  Normal Rate  Volume:  Decreased  Mood:  Depressed and Dysphoric  Affect:  Depressed and Flat  Thought Process:  Coherent, Linear and Descriptions of Associations: Circumstantial  Orientation:  Full (Time, Place, and Person)  Thought Content:  Logical, Delusions, Paranoid Ideation and Rumination  Suicidal Thoughts:  Yes.  with intent/plan  Homicidal Thoughts:  Denies \  Memory:  Immediate;   Good Recent;   Good Remote;   Good  Judgement:  Intact  Insight:  Fair  Psychomotor  Activity:  Psychomotor Retardation  Concentration:  Concentration: Fair and Attention Span: Fair  Recall:  [employer] of Knowledge:  Fair  Language:  Fair  Akathisia:  Negative  Handed:  Right  AIMS (if indicated):     Assets:  Desire for Improvement Financial Resources/Insurance Housing Leisure Time Physical Health Resilience  ADL's:  Intact  Cognition:  WNL  Sleep:  Number of Hours: 5.75     Treatment Plan Summary: Daily contact with patient to assess and evaluate symptoms and progress in treatment, Medication management and Plan : Patient is seen and examined.  Patient is a 53 year old male with the above-stated past psychiatric history who is seen in follow-up.  His mood and psychotic symptoms are slowly improving.  He denied any suicidal ideation today.  He continues to have sleep difficulties.  We will continue the olanzapine at 20 mg p.o. nightly and the venlafaxine extended release at 150 mg p.o. daily.  I will add Trileptal 150 mg p.o. nightly and titrate that to see if it assist in mood stability and sleep.  No change in his carvedilol, hydroxyzine or Keppra.  We will repeat his liver function enzymes, electrolytes and BNP in the a.m. 1.  Increase carvedilol to 25 mg p.o. twice daily for hypertension. 2.  Continue hydroxyzine 25 mg p.o. every 6 hours as needed anxiety. 3.  Continue Keppra 500 mg p.o. twice daily for seizures and mood stability. 4.  Continue olanzapine 20 mg p.o. nightly for mood,  psychosis and sleep. 5.  Add Trileptal 150 mg p.o. nightly and titrate for mood stability and sleep. 6.  Continue Effexor XR 150 mg p.o. daily for mood and anxiety. 7.  BNP 112.8, CMP improved at this.  8.  Disposition planning-in progress.  Maryagnes Amos, FNP 04/11/2018, 2:00 PM

## 2018-04-11 NOTE — [facility] Group Notes (Signed)
LCSW Group Therapy Note  04/11/2018   10:00-11:00am   Type of Therapy and Topic:  Group Therapy: Anger Cues and Responses  Participation Level:  Active   Description of Group:   In this group, patients learned how to recognize the physical, cognitive, emotional, and behavioral responses they have to anger-provoking situations.  They identified a recent time they became angry and how they reacted.  They analyzed how their reaction was possibly beneficial and how it was possibly unhelpful.  The group discussed a variety of healthier coping skills that could help with such a situation in the future.  Deep breathing was practiced briefly.  Therapeutic Goals: 1. Patients will remember their last incident of anger and how they felt emotionally and physically, what their thoughts were at the time, and how they behaved. 2. Patients will identify how their behavior at that time worked for them, as well as how it worked against them. 3. Patients will explore possible new behaviors to use in future anger situations. 4. Patients will learn that anger itself is normal and cannot be eliminated, and that healthier reactions can assist with resolving conflict rather than worsening situations.  Summary of Patient Progress:  The patient shared that his most recent time of anger was yesterday and said he got "frustrated" with the problems which have brought him to the hospital.  His reaction was to "shelve" them.  Therapeutic Modalities:   Cognitive Behavioral Therapy  David Dennis

## 2018-04-12 MED ORDER — AMLODIPINE BESYLATE 5 MG PO TABS
5.0000 mg | ORAL_TABLET | Freq: Every day | ORAL | Status: DC
  Administered 2018-04-12 – 2018-04-16 (×5): 5 mg via ORAL
  Filled 2018-04-12 (×7): qty 1

## 2018-04-12 MED ORDER — BIOTENE DRY MOUTH MT LIQD
15.0000 mL | OROMUCOSAL | Status: DC | PRN
  Administered 2018-04-12 – 2018-04-16 (×7): 15 mL via OROMUCOSAL
  Filled 2018-04-12: qty 237

## 2018-04-12 NOTE — Progress Notes (Addendum)
D:  Patient's self inventory sheet, patient sleeps good, sleep medication helpful.  Good appetite, normal energy level, good concentration.  Rated depression 5, hopeless 7, anxiety 3.  Denied withdrawals.  Denied SI.  Denied physical problems.  Denied physical pain.  Goal is questioning some of his goals.  Plans to think.   "Thanks".  A:  Medications administered per MD orders.  Emotional support and encouragement given patient. R:  Denied SI and HI, contracts for safety.  Denied A/V hallucinations.  Safety maintained with 15 minute checks.  Patient stated he spends a lot of time trying to keep his head together.  Just doesn't seem worth it.  No suicidal plan here or after discharge.  If things get too bad, he will call someone.  He works to put things together.  Was in the [employer], stationed in [place] for [employer].

## 2018-04-12 NOTE — Progress Notes (Signed)
.  DAR NOTE: Pt present with flat affect and depressed mood in the unit. Pt stated his day was okay even though he still going through ups and downs, Still feeling depressed and tends to isolates. Pt denies physical pain, took all his meds as scheduled.  Pt's safety ensured with 15 minute and environmental checks. Pt currently denies SI/HI and A/V hallucinations. Pt verbally agrees to seek staff if SI/HI or A/VH occurs and to consult with staff before acting on these thoughts. Will continue POC.

## 2018-04-12 NOTE — Progress Notes (Signed)
[facility] MD Progress Note  04/12/2018 10:13 AM David Dennis  MRN:  [id number] Subjective:  Patient is a 53 year old male with a reported past psychiatric history significant for depression, anxiety and schizophrenia who originally presented to the [facility] emergency department on 04/01/2018 after an intentional overdose.   Objective: Patient is seen and examined.  Patient is a 53 year old male with the above-stated past psychiatric history who is seen in follow-up.  He is doing a little bit better today.  He was able to sleep last night, and that is been a big help for him.  He stated he is not feeling suicidal, but also he stated that if he woke up and died from some other reason that it would not bother him.  He denied any auditory or visual hallucinations.  His paranoid delusions also seem to be improving.  His laboratories revealed that his creatinine had dropped back down into the normal range at 1.18.  His AST is dropped down to normal range at 33, and that his ALT which was previously at 800 had dropped down to 308.  Also his BNP had dropped from 257 down to 112.8.  He denied any current side effects to the medications except for dry mouth.  His blood pressure still remains mildly elevated at 140/102.  His heart rate this morning is 82.  He is afebrile.  Nursing notes reflect that he slept 6 hours last night.  Principal Problem: Paranoid schizophrenia (HCC) Diagnosis: Principal Problem:   Paranoid schizophrenia (HCC) Active Problems:   Overdose   Social anxiety disorder   MDD (major depressive disorder), severe (HCC)   AKI (acute kidney injury) (HCC)  Total Time spent with patient: 20 minutes  Past Psychiatric History: See admission H&P  Past Medical History:  Past Medical History:  Diagnosis Date  . Depression   . Generalized anxiety disorder   . Hypertension    History reviewed. No pertinent surgical history. Family History: History reviewed. No pertinent family  history. Family Psychiatric  History: See admission H&P Social History:  Social History   Substance and Sexual Activity  Alcohol Use Yes  . Alcohol/week: 84.0 standard drinks  . Types: 84 Cans of beer per week   Comment: "about a 12 pack of beer everyday"     Social History   Substance and Sexual Activity  Drug Use No    Social History   Socioeconomic History  . Marital status: Single    Spouse name: Not on file  . Number of children: Not on file  . Years of education: Not on file  . Highest education level: Not on file  Occupational History  . Not on file  Social Needs  . Financial resource strain: Not on file  . Food insecurity:    Worry: Not on file    Inability: Not on file  . Transportation needs:    Medical: Not on file    Non-medical: Not on file  Tobacco Use  . Smoking status: Former Smoker    Last attempt to quit: 04/01/2018    Years since quitting: 0.0  . Smokeless tobacco: Never Used  Substance and Sexual Activity  . Alcohol use: Yes    Alcohol/week: 84.0 standard drinks    Types: 84 Cans of beer per week    Comment: "about a 12 pack of beer everyday"  . Drug use: No  . Sexual activity: Not on file  Lifestyle  . Physical activity:    Days per week: Not  on file    Minutes per session: Not on file  . Stress: Not on file  Relationships  . Social connections:    Talks on phone: Not on file    Gets together: Not on file    Attends religious service: Not on file    Active member of club or organization: Not on file    Attends meetings of clubs or organizations: Not on file    Relationship status: Not on file  Other Topics Concern  . Not on file  Social History Narrative  . Not on file   Additional Social History:                         Sleep: Good  Appetite:  Fair  Current Medications: Current Facility-Administered Medications  Medication Dose Route Frequency Provider Last Rate Last Dose  . acetaminophen (TYLENOL) tablet 650 mg   650 mg Oral Q6H PRN Antonieta Pert, MD      . carvedilol (COREG) tablet 25 mg  25 mg Oral BID WC Antonieta Pert, MD   25 mg at 04/12/18 0820  . hydrOXYzine (ATARAX/VISTARIL) tablet 25 mg  25 mg Oral Q6H PRN Kerry Hough, PA-C   25 mg at 04/12/18 0820  . levETIRAcetam (KEPPRA) tablet 500 mg  500 mg Oral BID Antonieta Pert, MD   500 mg at 04/12/18 0820  . magnesium hydroxide (MILK OF MAGNESIA) suspension 5 mL  5 mL Oral QHS PRN Antonieta Pert, MD      . OLANZapine [facility]) tablet 20 mg  20 mg Oral QHS Antonieta Pert, MD   20 mg at 04/11/18 2124  . Oxcarbazepine (TRILEPTAL) tablet 300 mg  300 mg Oral QHS Antonieta Pert, MD   300 mg at 04/11/18 2124  . venlafaxine XR (EFFEXOR-XR) 24 hr capsule 150 mg  150 mg Oral Q breakfast Antonieta Pert, MD   150 mg at 04/12/18 0820    Lab Results:  Results for orders placed or performed during the hospital encounter of 04/08/18 (from the past 48 hour(s))  Comprehensive metabolic panel     Status: Abnormal   Collection Time: 04/11/18  6:10 AM  Result Value Ref Range   Sodium 140 135 - 145 mmol/L   Potassium 4.1 3.5 - 5.1 mmol/L   Chloride 110 98 - 111 mmol/L   CO2 22 22 - 32 mmol/L   Glucose, Bld 87 70 - 99 mg/dL   BUN 31 (H) 6 - 20 mg/dL   Creatinine, Ser 4.48 0.61 - 1.24 mg/dL   Calcium 9.2 8.9 - 18.5 mg/dL   Total Protein 7.0 6.5 - 8.1 g/dL   Albumin 3.9 3.5 - 5.0 g/dL   AST 33 15 - 41 U/L   ALT 308 (H) 0 - 44 U/L   Alkaline Phosphatase 115 38 - 126 U/L   Total Bilirubin 0.4 0.3 - 1.2 mg/dL   GFR calc non Af Amer >60 >60 mL/min   GFR calc Af Amer >60 >60 mL/min   Anion gap 8 5 - 15    Comment: Performed at [facility], [address] [place], [place] [address]  Brain natriuretic peptide     Status: Abnormal   Collection Time: 04/11/18  6:10 AM  Result Value Ref Range   B Natriuretic Peptide 112.8 (H) 0.0 - 100.0 pg/mL    Comment: Performed at [facility], [address] [place], [place]  [address]    Blood Alcohol level:  Lab Results  Component Value Date   ETH <10 04/01/2018   ETH <5 08/10/2015    Metabolic Disorder Labs: No results found for: HGBA1C, MPG No results found for: PROLACTIN No results found for: CHOL, TRIG, HDL, CHOLHDL, VLDL, LDLCALC  Physical Findings: AIMS: Facial and Oral Movements Muscles of Facial Expression: None, normal Lips and Perioral Area: None, normal Jaw: None, normal Tongue: None, normal,Extremity Movements Upper (arms, wrists, hands, fingers): None, normal Lower (legs, knees, ankles, toes): None, normal, Trunk Movements Neck, shoulders, hips: None, normal, Overall Severity Severity of abnormal movements (highest score from questions above): None, normal Incapacitation due to abnormal movements: None, normal Patient's awareness of abnormal movements (rate only patient's report): No Awareness, Dental Status Current problems with teeth and/or dentures?: No Does patient usually wear dentures?: No  CIWA:  CIWA-Ar Total: 2 COWS:     Musculoskeletal: Strength & Muscle Tone: within normal limits Gait & Station: normal Patient leans: N/A  Psychiatric Specialty Exam: Physical Exam  Nursing note and vitals reviewed. Constitutional: He is oriented to person, place, and time. He appears well-developed and well-nourished.  HENT:  Head: Normocephalic and atraumatic.  Respiratory: Effort normal.  Neurological: He is alert and oriented to person, place, and time.    ROS  Blood pressure (!) 140/102, pulse 82, temperature 98.3 F (36.8 C), temperature source Oral, resp. rate 20, height 6' (1.829 m), weight 68 kg, SpO2 100 %.Body mass index is 20.34 kg/m.  General Appearance: Casual  Eye Contact:  Fair  Speech:  Normal Rate  Volume:  Decreased  Mood:  Depressed  Affect:  Congruent  Thought Process:  Coherent and Descriptions of Associations: Intact  Orientation:  Full (Time, Place, and Person)  Thought Content:   Logical  Suicidal Thoughts:  No  Homicidal Thoughts:  No  Memory:  Immediate;   Fair Recent;   Fair Remote;   Fair  Judgement:  Intact  Insight:  Fair  Psychomotor Activity:  Psychomotor Retardation  Concentration:  Concentration: Fair and Attention Span: Fair  Recall:  [employer] of Knowledge:  Fair  Language:  Fair  Akathisia:  Negative  Handed:  Right  AIMS (if indicated):     Assets:  Desire for Improvement Financial Resources/Insurance Housing Resilience Social Support  ADL's:  Intact  Cognition:  WNL  Sleep:  Number of Hours: 6     Treatment Plan Summary: Daily contact with patient to assess and evaluate symptoms and progress in treatment, Medication management and Plan : Patient is a 53 year old male with the above-stated past psychiatric history who is seen in follow-up. Patient continues to slowly improve.  He still depressed, but his suicidal ideation has decreased.  He still has some fatalistic thinking.  His sleep is improved with the Trileptal, and we will continue that.  No change in his olanzapine or venlafaxine extended release.  His creatinine has improved, and his liver function enzymes have improved.  His BNP has decreased as well.  His blood pressure continues to be mildly elevated, and his carvedilol was increased yesterday.  Review of the records with the patient also revealed that he had previously been treated with amlodipine with metoprolol in the past.  I am going to add 5 mg of amlodipine today, and see how he responds to that.  No other changes to his medications. 1.  Continue carvedilol 25 mg p.o. twice daily for hypertension and cardiac disease. 2.  Continue hydroxyzine 25 mg p.o.  every 6 hours as needed anxiety. 3.  Continue Keppra 500 mg p.o. twice daily for seizure disorder. 4.  Continue olanzapine 20 mg p.o. nightly for sleep as well as psychosis. 5.  Continue Trileptal 300 mg p.o. nightly for mood stability and insomnia. 6.  Continue venlafaxine  extended release 150 mg p.o. daily for anxiety and depression. 7.  Add amlodipine 5 mg p.o. daily for hypertension and chest pain. 8.  Disposition planning-in progress.  Antonieta Pert, MD 04/12/2018, 10:13 AM

## 2018-04-12 NOTE — Plan of Care (Signed)
Nurse discussed anxiety, depression and coping skills with patient.  

## 2018-04-12 NOTE — [facility] Group Notes (Signed)
[facility] Group Notes:  (Nursing/MHT/Case Management/Adjunct)  Date:  04/12/2018  Time:  6:34 PM  Type of Therapy:  Nurse Education  Participation Level:  Active  Participation Quality:  Appropriate and Attentive  Affect:  Appropriate  Cognitive:  Alert and Appropriate  Insight:  Good  Engagement in Group:  Engaged  Modes of Intervention:  Activity and Discussion  Summary of Progress/Problems: In this group, the RN reviewed healthy coping strategies. The patients were taught why they struggle with symptoms of depression and how to work through them. The patient was cooperative and participated.  David Dennis C [name] 04/12/2018, 6:34 PM 

## 2018-04-12 NOTE — [facility] Group Notes (Signed)
[facility] LCSW Group Therapy Note  04/12/2018   10:00-11:00AM  Type of Therapy and Topic:  Group Therapy:  Unhealthy versus Healthy Supports, Which Am I?  Participation Level:  Did Not Attend   Description of Group:  Patients in this group were introduced to the concept that additional supports including self-support are an essential part of recovery.  Initially a discussion was held about the differences between healthy versus unhealthy supports.  Patients were asked to share what unhealthy supports in their lives need to be addressed, as well as what additional healthy supports could be added for greater help in reaching their goals.   A song entitled "My Own Hero" was played and a group discussion ensued in which patients stated they could relate to the song and it inspired them to realize they have be willing to help themselves in order to succeed, because other people cannot achieve sobriety or stability for them.  We discussed adding a variety of healthy supports to address the various needs in patient lives, including becoming more self-supportive.  Therapeutic Goals: 1)  Highlight the differences between healthy and unhealthy supports 2)  Suggest the importance of being a part of one's own support system 2)  Discuss reasons people in one's life may eventually be unable to be continually supportive  3)  Identify the patient's current support system and   4)  elicit commitments to add healthy supports and to become more conscious of being self-supportive   Summary of Patient Progress:  N/A  Therapeutic Modalities:   Motivational Interviewing Activity  Shawntez Dickison J Grossman-Orr   

## 2018-04-12 NOTE — [facility] Group Notes (Signed)
Adult Psychoeducational Group Note  Date:  04/12/2018 Time:  9:26 PM  Group Topic/Focus:  Wrap-Up Group:   The focus of this group is to help patients review their daily goal of treatment and discuss progress on daily workbooks.  Participation Level:  Active  Participation Quality:  Appropriate and Attentive  Affect:  Appropriate  Cognitive:  Alert and Appropriate  Insight: Appropriate and Good  Engagement in Group:  Engaged  Modes of Intervention:  Discussion and Education  Additional Comments:  Pt attended and participated in wrap up group this evening. Pt rated their day a 5/10, due to their day being "up and down" today. Pt is still working on their goal to try to remain relaxed.   David Dennis 04/12/2018, 9:26 PM

## 2018-04-13 MED ORDER — OXCARBAZEPINE 150 MG PO TABS
450.0000 mg | ORAL_TABLET | Freq: Every day | ORAL | Status: DC
  Administered 2018-04-13 – 2018-04-18 (×6): 450 mg via ORAL
  Filled 2018-04-13 (×9): qty 3

## 2018-04-13 MED ORDER — OXCARBAZEPINE 150 MG PO TABS
150.0000 mg | ORAL_TABLET | Freq: Every day | ORAL | Status: DC
  Administered 2018-04-13 – 2018-04-21 (×9): 150 mg via ORAL
  Filled 2018-04-13 (×8): qty 1
  Filled 2018-04-13: qty 7
  Filled 2018-04-13 (×5): qty 1

## 2018-04-13 MED ORDER — VENLAFAXINE HCL ER 75 MG PO CP24
225.0000 mg | ORAL_CAPSULE | Freq: Every day | ORAL | Status: DC
  Administered 2018-04-14 – 2018-04-21 (×8): 225 mg via ORAL
  Filled 2018-04-13 (×6): qty 3
  Filled 2018-04-13: qty 21
  Filled 2018-04-13 (×5): qty 3

## 2018-04-13 NOTE — [facility] Group Notes (Signed)
LCSW Group Therapy Note 04/13/2018 12:55 PM  Type of Therapy and Topic: Group Therapy: Overcoming Obstacles  Participation Level: Active  Description of Group:  In this group patients will be encouraged to explore what they see as obstacles to their own wellness and recovery. They will be guided to discuss their thoughts, feelings, and behaviors related to these obstacles. The group will process together ways to cope with barriers, with attention given to specific choices patients can make. Each patient will be challenged to identify changes they are motivated to make in order to overcome their obstacles. This group will be process-oriented, with patients participating in exploration of their own experiences as well as giving and receiving support and challenge from other group members.  Therapeutic Goals: 1. Patient will identify personal and current obstacles as they relate to admission. 2. Patient will identify barriers that currently interfere with their wellness or overcoming obstacles.  3. Patient will identify feelings, thought process and behaviors related to these barriers. 4. Patient will identify two changes they are willing to make to overcome these obstacles:   Summary of Patient Progress  David Dennis was engaged and participated throughout the group session. David Dennis states that his main obstacle is "anxiety and depression". He reports him not dealing with his feelings is the main barrier.    Therapeutic Modalities:  Cognitive Behavioral Therapy Solution Focused Therapy Motivational Interviewing Relapse Prevention Therapy   David Dennis Clinical Social Worker

## 2018-04-13 NOTE — Progress Notes (Signed)
[place] MD Progress Note  04/13/2018 11:16 AM David Dennis  MRN:  [id number] Subjective:  Patient is a 53 year old male with a reported past psychiatric history significant for depression, anxiety and schizophrenia who originally presented to the [facility] emergency department on 04/01/2018 after an intentional overdose.   Objective: Patient is seen and examined.  Patient is a 53 year old male with the above-stated past psychiatric history is seen in follow-up.  He stated he is not doing as well today as yesterday.  He stated he went to a group today, and the anxiety over being around people really got to him.  He continues to obsess over the possibility of not sleeping.  He did sleep 5.75 hours last night per nursing notes.  He does obsess to a certain degree, and I did ask whether or not he had ever been previously treated with Anafranil or fluvoxamine.  He stated he was unable to recall those.  He denied any suicidal ideation today.  We again discussed the fact that his kidney function as well as his liver function had improved significantly since he was originally admitted to the hospital.  As well, his blood pressure is doing better today with the combination of the amlodipine with the carvedilol.  We discussed the possibility of adding a dose of Trileptal during the day and increasing his Trileptal to 450 mg p.o. nightly.  Principal Problem: Paranoid schizophrenia (HCC) Diagnosis: Principal Problem:   Paranoid schizophrenia (HCC) Active Problems:   Overdose   Social anxiety disorder   MDD (major depressive disorder), severe (HCC)   AKI (acute kidney injury) (HCC)  Total Time spent with patient: 20 minutes  Past Psychiatric History: See admission H&P  Past Medical History:  Past Medical History:  Diagnosis Date  . Depression   . Generalized anxiety disorder   . Hypertension    History reviewed. No pertinent surgical history. Family History: History reviewed. No pertinent family  history. Family Psychiatric  History: See admission H&P Social History:  Social History   Substance and Sexual Activity  Alcohol Use Yes  . Alcohol/week: 84.0 standard drinks  . Types: 84 Cans of beer per week   Comment: "about a 12 pack of beer everyday"     Social History   Substance and Sexual Activity  Drug Use No    Social History   Socioeconomic History  . Marital status: Single    Spouse name: Not on file  . Number of children: Not on file  . Years of education: Not on file  . Highest education level: Not on file  Occupational History  . Not on file  Social Needs  . Financial resource strain: Not on file  . Food insecurity:    Worry: Not on file    Inability: Not on file  . Transportation needs:    Medical: Not on file    Non-medical: Not on file  Tobacco Use  . Smoking status: Former Smoker    Last attempt to quit: 04/01/2018    Years since quitting: 0.0  . Smokeless tobacco: Never Used  Substance and Sexual Activity  . Alcohol use: Yes    Alcohol/week: 84.0 standard drinks    Types: 84 Cans of beer per week    Comment: "about a 12 pack of beer everyday"  . Drug use: No  . Sexual activity: Not on file  Lifestyle  . Physical activity:    Days per week: Not on file    Minutes per session: Not  on file  . Stress: Not on file  Relationships  . Social connections:    Talks on phone: Not on file    Gets together: Not on file    Attends religious service: Not on file    Active member of club or organization: Not on file    Attends meetings of clubs or organizations: Not on file    Relationship status: Not on file  Other Topics Concern  . Not on file  Social History Narrative  . Not on file   Additional Social History:                         Sleep: Fair  Appetite:  Fair  Current Medications: Current Facility-Administered Medications  Medication Dose Route Frequency Provider Last Rate Last Dose  . acetaminophen (TYLENOL) tablet 650 mg   650 mg Oral Q6H PRN David Pert, MD      . amLODipine (NORVASC) tablet 5 mg  5 mg Oral Daily David Pert, MD   5 mg at 04/13/18 0809  . antiseptic oral rinse (BIOTENE) solution 15 mL  15 mL Mouth Rinse PRN David Pert, MD   15 mL at 04/13/18 0811  . carvedilol (COREG) tablet 25 mg  25 mg Oral BID WC David Pert, MD   25 mg at 04/13/18 0809  . hydrOXYzine (ATARAX/VISTARIL) tablet 25 mg  25 mg Oral Q6H PRN Kerry Hough, PA-C   25 mg at 04/13/18 0809  . levETIRAcetam (KEPPRA) tablet 500 mg  500 mg Oral BID David Pert, MD   500 mg at 04/13/18 0809  . magnesium hydroxide (MILK OF MAGNESIA) suspension 5 mL  5 mL Oral QHS PRN David Pert, MD      . OLANZapine [facility]) tablet 20 mg  20 mg Oral QHS David Pert, MD   20 mg at 04/12/18 2049  . OXcarbazepine (TRILEPTAL) tablet 150 mg  150 mg Oral Daily David Pert, MD      . OXcarbazepine (TRILEPTAL) tablet 450 mg  450 mg Oral QHS David Pert, MD      . Melene Muller ON 04/14/2018] venlafaxine XR (EFFEXOR-XR) 24 hr capsule 225 mg  225 mg Oral Q breakfast David Pert, MD        Lab Results: No results found for this or any previous visit (from the past 48 hour(s)).  Blood Alcohol level:  Lab Results  Component Value Date   ETH <10 04/01/2018   ETH <5 08/10/2015    Metabolic Disorder Labs: No results found for: HGBA1C, MPG No results found for: PROLACTIN No results found for: CHOL, TRIG, HDL, CHOLHDL, VLDL, LDLCALC  Physical Findings: AIMS: Facial and Oral Movements Muscles of Facial Expression: None, normal Lips and Perioral Area: None, normal Jaw: None, normal Tongue: None, normal,Extremity Movements Upper (arms, wrists, hands, fingers): None, normal Lower (legs, knees, ankles, toes): None, normal, Trunk Movements Neck, shoulders, hips: None, normal, Overall Severity Severity of abnormal movements (highest score from questions above): None, normal Incapacitation due to  abnormal movements: None, normal Patient's awareness of abnormal movements (rate only patient's report): No Awareness, Dental Status Current problems with teeth and/or dentures?: No Does patient usually wear dentures?: No  CIWA:  CIWA-Ar Total: 1 COWS:  COWS Total Score: 2  Musculoskeletal: Strength & Muscle Tone: within normal limits Gait & Station: normal Patient leans: N/A  Psychiatric Specialty Exam: Physical Exam  Nursing note and vitals reviewed. Constitutional:  He is oriented to person, place, and time. He appears well-developed and well-nourished.  HENT:  Head: Normocephalic and atraumatic.  Respiratory: Effort normal.  Neurological: He is alert and oriented to person, place, and time.    ROS  Blood pressure 125/89, pulse 87, temperature 98.1 F (36.7 C), temperature source Oral, resp. rate 16, height 6' (1.829 m), weight 68 kg, SpO2 100 %.Body mass index is 20.34 kg/m.  General Appearance: Casual  Eye Contact:  Fair  Speech:  Normal Rate  Volume:  Decreased  Mood:  Anxious and Dysphoric  Affect:  Congruent  Thought Process:  Coherent and Descriptions of Associations: Intact  Orientation:  Full (Time, Place, and Person)  Thought Content:  Logical  Suicidal Thoughts:  No  Homicidal Thoughts:  No  Memory:  Immediate;   Fair Recent;   Fair Remote;   Fair  Judgement:  Intact  Insight:  Lacking  Psychomotor Activity:  Increased  Concentration:  Concentration: Fair and Attention Span: Fair  Recall:  [employer] of Knowledge:  Fair  Language:  Fair  Akathisia:  Negative  Handed:  Right  AIMS (if indicated):     Assets:  Communication Skills Desire for Improvement Housing Physical Health Resilience  ADL's:  Intact  Cognition:  WNL  Sleep:  Number of Hours: 5.25     Treatment Plan Summary: Daily contact with patient to assess and evaluate symptoms and progress in treatment, Medication management and Plan : Patient is seen and examined.  Patient is a  53 year old male with the above-stated past psychiatric history who is seen in follow-up.  Patient stated that he is not doing as well today as he had yesterday.  His anxiety is increased, and he continues to obsess over the possibility of not sleeping as well as difficulty in group situations.  Given his previous multidrug overdose and the seriousness of this to try and combat his anxiety I am going to add Trileptal 150 mg p.o. daily, and as well increase his Trileptal dose at bedtime to 450 mg.  We will continue the Zyprexa 20 mg p.o. nightly in between the Trileptal and Zyprexa hopefully he will sleep better.  He denied any auditory or visual hallucinations.  I had considered switching to Anafranil or Luvox given his obsessive/ruminative thought processes, but we will hold off on that for now.  With regard to his medical issues his liver function enzymes are improving and his renal function is essentially back to normal.  His blood pressure is better today with the combination of the amlodipine with the carvedilol.  I will monitor his blood pressure today, and if it accelerates I may increase his amlodipine to 10 mg p.o. daily. 1.  Continue amlodipine 5 mg p.o. daily for blood pressure control. 2.  Continue carvedilol 25 mg p.o. twice daily for cardiovascular disease as well as hypertension control. 3.  Continue hydroxyzine 25 mg p.o. every 6 hours as needed anxiety. 4.  Continue Keppra 500 mg p.o. twice daily for seizure disorder. 5.  Continue Zyprexa 20 mg p.o. nightly for schizophrenia and insomnia. 6.  Increase Trileptal to 150 mg p.o. daily, and 450 mg p.o. nightly for mood stability, anxiety and sleep. 7.  Continue venlafaxine extended release 225 mg p.o. daily.  This is for anxiety and mood. 8.  Disposition planning-in progress. 6.  David Pert, MD 04/13/2018, 11:16 AM

## 2018-04-13 NOTE — Progress Notes (Signed)
D: Patient observed isolative, withdrawn. Patient states, "when I feel hopeless I keep to myself as it helps me cope. But then if I start to feel lonely, that makes me depressed as well." Patient's affect blank, mood ambivalent. Denies pain, physical complaints.   A: Medicated per orders, no prns requested or required. Medication education provided. Level III obs in place for safety. Emotional support offered. Patient encouraged to complete Suicide Safety Plan before discharge. Encouraged to attend and participate in unit programming.  Fall prevention plan in place and reviewed with patient as pt is a high fall risk due to seizure disorder.   R: Patient verbalizes understanding of POC, falls prevention education.. Patient denies SI but states, "I mean, nothing active. But if I didn't wake up, well, then I don't wake up." Verbal contract in place for safety.  When asked about AH patient states, "no, I don't hear things, but I do see conclusions that others don't." No HI/VH and remains safe on level III obs. Will continue to monitor throughout the night.

## 2018-04-13 NOTE — Progress Notes (Signed)
Patient states that he walked back and forth in the hallway as a means of coping. No additional details about his day were provided. His goal for tomorrow is to use his coping skills for his anxiety and depression.

## 2018-04-13 NOTE — Plan of Care (Signed)
Nurse discussed anxiety, depression, coping skills, suicidal thoughts with patient.

## 2018-04-13 NOTE — [facility] Suicide Risk Assessment (Signed)
[facility] INPATIENT:  Family/Significant Other Suicide Prevention Education  Suicide Prevention Education:  Education Completed; with mother, Sixto Heigel (home - [phone]) (cell - [phone]) has been identified by the patient as the family member/significant other with whom the patient will be residing, and identified as the person(s) who will aid the patient in the event of a mental health crisis (suicidal ideations/suicide attempt).  With written consent from the patient, the family member/significant other has been provided the following suicide prevention education, prior to the and/or following the discharge of the patient.  The suicide prevention education provided includes the following:  Suicide risk factors  Suicide prevention and interventions  National Suicide Hotline telephone number  [facility] assessment telephone number  [facility] Emergency Assistance 911  [facility] and/or Residential Mobile Crisis Unit telephone number  Request made of family/significant other to:  Remove weapons (e.g., guns, rifles, knives), all items previously/currently identified as safety concern.    Remove drugs/medications (over-the-counter, prescriptions, illicit drugs), all items previously/currently identified as a safety concern.  The family member/significant other verbalizes understanding of the suicide prevention education information provided.  The family member/significant other agrees to remove the items of safety concern listed above.  Patient's mother has no concerns for the patient at this time, however she did inquire about the patient's discharge plans. CSW informed the patient's mother of the patient's tentative discharge plans [facility] [place] [facility] [place])  and agreed to notify her once a plan was confirmed. CSW will continue to follow.    MEDHANSH SPANGLER 04/13/2018, 1:00 PM

## 2018-04-13 NOTE — Progress Notes (Signed)
D:  Patient's self inventory sheet, patient has poor sleep, sleep medication helpful.  Fair appetite, low energy level, poor concentration.  Rated depression and hopeless 8, anxiety 7.  Denied withdrawals.  Denied SI.  Denied physical problems.  Denied physical pain.  Goal is "just wondering why about some problems.  Plans to "try to lose some of the fear and think".  "Thanks." A:  Medications administered per MD orders.  Emotional support and encouragement given patient. R:  Denied SI and HI, contracts for safety.  Denied A/V hallucinations.  Safety maintained with 15 minute checks. Patient stated that he does have SI thoughts after discharge, but no SI thoughts while at [facility], contracts for safety.  Patient stated "Weird, relieves stress to think about suicide after discharge, solution.  Will talk to therapist after discharge.  No visions and no voices today."

## 2018-04-13 NOTE — Progress Notes (Signed)
Recreation Therapy Notes  Date:  3.2.20 Time: 0930 Location: 300 Hall Dayroom  Group Topic: Stress Management  Goal Area(s) Addresses:  Patient will identify positive stress management techniques. Patient will identify benefits of using stress management post d/c.  Intervention: Stress Management  Activity :  Meditation.  LRT introduced the stress management technique of meditation.  LRT played a meditation that focused on having a productive and meaningful day.  Patients were to listen and follow along as meditation played.  Education:  Stress Management, Discharge Planning.   Education Outcome: Acknowledges Education  Clinical Observations/Feedback: Pt did not attend group.     Tammara Massing, LRT/CTRS         Mosie Angus A 04/13/2018 11:27 AM 

## 2018-04-14 NOTE — Progress Notes (Signed)
Patient ID: David Dennis, male   DOB: 11/02/1965, 53 y.o.   MRN: [id number]  Patient asked writer to look at a pressure ulcer he "developed at the other hospital". Healed wound noted at the top of patient's buttox between the skin. Skin slightly reddened but is currently intact with no drainage. Patient denied further concerns.

## 2018-04-14 NOTE — Progress Notes (Signed)
Patient ID: David Dennis, male   DOB: 01/20/1966, 53 y.o.   MRN: [id number]  Nursing Progress Note [id number]  On initial approach, patient was still observed in his room. Patient presents with anxious affect and depressed mood. Patient eye contact was brief and he forwarded little during engagement. Patient reported he slept well and denied concerns for Clinical research associate. Patient was compliant with scheduled medications. Patient is seen attending groups but is minimally interactive with peers. Patient currently denies SI/HI/AVH.   Patient is educated about and provided medication per provider's orders. Patient safety maintained with q15 min safety checks and low fall risk precautions. Emotional support given, 1:1 interaction, and active listening provided. Patient encouraged to attend meals, groups, and work on treatment plan and goals. Labs, vital signs and patient behavior monitored throughout shift.   No medication side effects observed. Patient contracts for safety with staff. Patient remains safe on the unit at this time and agrees to come to staff with any issues/concerns. Will continue to support and monitor.   Patient's self-inventory sheet Rated Energy Level  Low  Rated Sleep  Good  Rated Appetite  Good  Rated Anxiety (0-10)  5  Rated Hopelessness (0-10)  7  Rated Depression (0-10)  4  Daily Goal  "reduce helplessness, depression and anxiety, think of coping sills and relaxtion"  Any Additional Comments:  "Thank you"

## 2018-04-14 NOTE — Progress Notes (Signed)
[facility] MD Progress Note  04/14/2018 1:37 PM David Dennis  MRN:  [id number] Subjective: Patient reports ongoing depression, anxiety.  States he feels anxiety is more chronic and more severe than depression and that his depression is at least partially secondary to his anxiety.  He describes anxiety as  chronic, severe, and describes it mostly as social anxiety/more severe when interacting with others or feeling at the center of attention . Today states he feels slightly better than he did prior to admission but describes lingering symptoms of both depression and anxiety and endorses persistent passive thoughts of death, although currently denies any plan or intention of suicide or of hurting himself . Does not currently endorse medication side effects.   Objective: I have discussed case with treatment team and have met with patient.  Patient is a 53 year old male.  [employer].  Denies combat experiences.  He reports prior history of chronic mental illness and stresses chronic anxiety is major issue.  As per chart he has been diagnosed with depression, anxiety, schizophrenia in the past.  He attempted suicide on February 19 by overdosing.  Attempt was severe and required ICU setting treatment due to multiorgan compromise.  At present, patient describes lingering depression which she attributes in part to severe/chronic anxiety, which she describes mainly as social anxiety.  Currently he is not endorsing hallucinations, no delusions are expressed, no thought disorder is noted.  Regarding past diagnosis of schizophrenia, does not currently endorse any clear history of psychosis, although does describe a history of feeling others are going to criticize/reject him, and which may be consistent with social anxiety.  He presents anxious, depressed, constricted in affect, calm/no psychomotor agitation.  Currently denies medication side effects and does state he feels they are "helping a little bit",   "starting to help". Limited interaction with peers.  Anxious but polite and cooperative on approach.  Labs reviewed-AST and ALT values much improved compared to initial numbers. BUN, Creatinine normalized. 2/27 EKG-NSR, QTC 450  Principal Problem: Paranoid schizophrenia ([place]) Diagnosis: Principal Problem:   Paranoid schizophrenia ([place]) Active Problems:   Overdose   Social anxiety disorder   AKI (acute kidney injury) ([place])   MDD (major depressive disorder), severe ([place])  Total Time spent with patient: 20 minutes  Past Psychiatric History: See admission H&P  Past Medical History:  Past Medical History:  Diagnosis Date  . Depression   . Generalized anxiety disorder   . Hypertension    History reviewed. No pertinent surgical history. Family History: History reviewed. No pertinent family history. Family Psychiatric  History: See admission H&P Social History:  Social History   Substance and Sexual Activity  Alcohol Use Yes  . Alcohol/week: 84.0 standard drinks  . Types: 84 Cans of beer per week   Comment: "about a 12 pack of beer everyday"     Social History   Substance and Sexual Activity  Drug Use No    Social History   Socioeconomic History  . Marital status: Single    Spouse name: Not on file  . Number of children: Not on file  . Years of education: Not on file  . Highest education level: Not on file  Occupational History  . Not on file  Social Needs  . Financial resource strain: Not on file  . Food insecurity:    Worry: Not on file    Inability: Not on file  . Transportation needs:    Medical: Not on file    Non-medical: Not  on file  Tobacco Use  . Smoking status: Former Smoker    Last attempt to quit: 04/01/2018    Years since quitting: 0.0  . Smokeless tobacco: Never Used  Substance and Sexual Activity  . Alcohol use: Yes    Alcohol/week: 84.0 standard drinks    Types: 84 Cans of beer per week    Comment: "about a 12 pack of beer everyday"  . Drug  use: No  . Sexual activity: Not on file  Lifestyle  . Physical activity:    Days per week: Not on file    Minutes per session: Not on file  . Stress: Not on file  Relationships  . Social connections:    Talks on phone: Not on file    Gets together: Not on file    Attends religious service: Not on file    Active member of club or organization: Not on file    Attends meetings of clubs or organizations: Not on file    Relationship status: Not on file  Other Topics Concern  . Not on file  Social History Narrative  . Not on file   Additional Social History:   Sleep: Partially improved  Appetite:  Fair  Current Medications: Current Facility-Administered Medications  Medication Dose Route Frequency Provider Last Rate Last Dose  . acetaminophen (TYLENOL) tablet 650 mg  650 mg Oral Q6H PRN Sharma Covert, MD      . amLODipine (NORVASC) tablet 5 mg  5 mg Oral Daily Sharma Covert, MD   5 mg at 04/14/18 0802  . antiseptic oral rinse (BIOTENE) solution 15 mL  15 mL Mouth Rinse PRN Sharma Covert, MD   15 mL at 04/14/18 0803  . carvedilol (COREG) tablet 25 mg  25 mg Oral BID WC Sharma Covert, MD   25 mg at 04/14/18 0802  . hydrOXYzine (ATARAX/VISTARIL) tablet 25 mg  25 mg Oral Q6H PRN Laverle Hobby, PA-C   25 mg at 04/13/18 1631  . levETIRAcetam (KEPPRA) tablet 500 mg  500 mg Oral BID Sharma Covert, MD   500 mg at 04/14/18 0802  . magnesium hydroxide (MILK OF MAGNESIA) suspension 5 mL  5 mL Oral QHS PRN Sharma Covert, MD      . OLANZapine [facility]) tablet 20 mg  20 mg Oral QHS Sharma Covert, MD   20 mg at 04/13/18 2155  . OXcarbazepine (TRILEPTAL) tablet 150 mg  150 mg Oral Daily Sharma Covert, MD   150 mg at 04/14/18 0801  . OXcarbazepine (TRILEPTAL) tablet 450 mg  450 mg Oral QHS Sharma Covert, MD   450 mg at 04/13/18 2155  . venlafaxine XR (EFFEXOR-XR) 24 hr capsule 225 mg  225 mg Oral Q breakfast Sharma Covert, MD   225 mg at 04/14/18  0801    Lab Results: No results found for this or any previous visit (from the past 48 hour(s)).  Blood Alcohol level:  Lab Results  Component Value Date   ETH <10 04/01/2018   ETH <5 76/22/6333    Metabolic Disorder Labs: No results found for: HGBA1C, MPG No results found for: PROLACTIN No results found for: CHOL, TRIG, HDL, CHOLHDL, VLDL, LDLCALC  Physical Findings: AIMS: Facial and Oral Movements Muscles of Facial Expression: None, normal Lips and Perioral Area: None, normal Jaw: None, normal Tongue: None, normal,Extremity Movements Upper (arms, wrists, hands, fingers): None, normal Lower (legs, knees, ankles, toes): None, normal, Trunk Movements Neck, shoulders, hips:  None, normal, Overall Severity Severity of abnormal movements (highest score from questions above): None, normal Incapacitation due to abnormal movements: None, normal Patient's awareness of abnormal movements (rate only patient's report): No Awareness, Dental Status Current problems with teeth and/or dentures?: No Does patient usually wear dentures?: No  CIWA:  CIWA-Ar Total: 1 COWS:  COWS Total Score: 2  Musculoskeletal: Strength & Muscle Tone: within normal limits Gait & Station: normal Patient leans: N/A  Psychiatric Specialty Exam: Physical Exam  Nursing note and vitals reviewed. Constitutional: He is oriented to person, place, and time. He appears well-developed and well-nourished.  HENT:  Head: Normocephalic and atraumatic.  Respiratory: Effort normal.  Neurological: He is alert and oriented to person, place, and time.    ROS no chest pain, no shortness of breath, no vomiting  Blood pressure (!) 140/98, pulse 72, temperature 97.7 F (36.5 C), temperature source Oral, resp. rate 18, height 6' (1.829 m), weight 68 kg, SpO2 100 %.Body mass index is 20.34 kg/m.  General Appearance: Fairly groomed  Eye Contact:  Fair, tends to improve partially during session  Speech:  Normal Rate  Volume:   Decreased  Mood:  Depressed, anxious  Affect:  congruent , anxious  Thought Process:  Linear and Descriptions of Associations: Intact  Orientation:  Other:  fully alert and attentive  Thought Content:  No hallucinations, no delusions, not internally preoccupied at this time  Suicidal Thoughts:  Yes.  without intent/plan-denies current suicidal plan or intention, endorses intermittent passive SI, no homicidal or violent ideations  Homicidal Thoughts:  No  Memory:  Recent and remote grossly intact  Judgement:  Other:  Fair/improving  Insight:  Fair  Psychomotor Activity:  Decreased  Concentration:  Concentration: Good and Attention Span: Good  Recall:  Good  Fund of Knowledge:  Good  Language:  Good  Akathisia:  Negative  Handed:  Right  AIMS (if indicated):     Assets:  Communication Skills Desire for Improvement Housing Physical Health Resilience  ADL's:  Intact  Cognition:  WNL  Sleep:  Number of Hours: 6.25   Assessment:  Patient is a 52 year old male.  [employer].  Denies combat experiences.  He reports prior history of chronic mental illness and stresses chronic anxiety is major issue.  As per chart he has been diagnosed with depression, anxiety, schizophrenia in the past.  He attempted suicide on February 19 by overdosing.  Attempt was severe and required ICU setting treatment due to multiorgan compromise.   Patient describes limited but some improvement compared to how he felt at admission.  He does endorse chronic and severe anxiety, described mostly as social anxiety.  He endorses persistent depression which she attributes to his chronic anxiety.  He endorses lingering passive thoughts of death, but currently does not endorse any suicidal plan or intention.  He is currently tolerating medications well, denies side effects.  Of note, patient states that he underwent 4 or 5 ECT treatments in the past, for depression.  He is unsure of date.  He does not think it helped much but  would be willing to reconsider this treatment option if mood does not improve significantly with medication strategies  Treatment Plan Summary: Treatment plan reviewed as below today 3/3 Encourage group participation to work on coping skills and symptom reduction Continue Zyprexa 20 mg nightly for mood disorder/anxiety Continue Trileptal 150 mg every morning and 400 mg nightly for mood disorder Continue Effexor XR 225 mg daily for mood disorder/anxiety Continue Keppra 500 mg  twice daily for seizure disorder  Continue Coreg 25 mg twice daily and Norvasc 5 mg daily for hypertension Continue Vistaril 25 mg every 6 hours PRN for anxiety as needed  Jenne Campus, MD 04/14/2018, 1:37 PM   Patient ID: David Dennis, male   DOB: 06-20-1965, 53 y.o.   MRN: [id number]

## 2018-04-14 NOTE — Plan of Care (Signed)
  Problem: Education: Goal: Knowledge of  General Education information/materials will improve Outcome: Progressing   Problem: Activity: Goal: Interest or engagement in activities will improve Outcome: Progressing Goal: Sleeping patterns will improve Outcome: Progressing   Problem: Health Behavior/Discharge Planning: Goal: Compliance with treatment plan for underlying cause of condition will improve Outcome: Progressing   Problem: Safety: Goal: Periods of time without injury will increase Outcome: Progressing   

## 2018-04-15 NOTE — Progress Notes (Signed)
[facility] MD Progress Note  04/15/2018 2:06 PM David Dennis  MRN:  [id number] Subjective: Patient describes some modest but noticeable improvement compared to how he felt yesterday.  Attributes improvement partly to a good visit from mother yesterday evening and feels he has family support.  He does state that he feels less severely anxious and a little more "relaxed" today. At this time does not endorse medication side effects. Today does not endorse suicidal plan or intention/contracts for safety on unit.  Objective: I have discussed case with treatment team and have met with patient.   Patient is a 53 year old male.  [employer].  Denies combat experiences.  He reports prior history of chronic mental illness and stresses chronic anxiety is major issue.  As per chart he has been diagnosed with depression, anxiety, schizophrenia in the past.  He attempted suicide on February 19 by overdosing.  Attempt was severe and required ICU setting treatment due to multiorgan compromise.  As above, patient describes some improvement compared to how he felt yesterday and today, although still anxious and with a constricted affect, he is noticed to smile appropriately at times and to overall appear better related with improved eye contact and increased verbal output.  Although acknowledges partial improvement does continue to describe depression and chronic/debilitating anxiety, described mostly as worse in social situations.  Describes some self-referential ideations such as feeling that others are talking about him when he hears groups of people talking but no overt delusions or psychotic symptoms are expressed. Currently he is not endorsing medication side effects.  Of note patient reported he had ECT treatment years ago, he is unsure how many treatments he received.  Yesterday he had said he did not feel this medication modality had worked but today he states that he feels it might have.  Does not remember  having had side effects.  Principal Problem: Paranoid schizophrenia ([place]) Diagnosis: Principal Problem:   Paranoid schizophrenia ([place]) Active Problems:   Overdose   Social anxiety disorder   AKI (acute kidney injury) ([place])   MDD (major depressive disorder), severe ([place])  Total Time spent with patient: 20 minutes  Past Psychiatric History: See admission H&P  Past Medical History:  Past Medical History:  Diagnosis Date  . Depression   . Generalized anxiety disorder   . Hypertension    History reviewed. No pertinent surgical history. Family History: History reviewed. No pertinent family history. Family Psychiatric  History: See admission H&P Social History:  Social History   Substance and Sexual Activity  Alcohol Use Yes  . Alcohol/week: 84.0 standard drinks  . Types: 84 Cans of beer per week   Comment: "about a 12 pack of beer everyday"     Social History   Substance and Sexual Activity  Drug Use No    Social History   Socioeconomic History  . Marital status: Single    Spouse name: Not on file  . Number of children: Not on file  . Years of education: Not on file  . Highest education level: Not on file  Occupational History  . Not on file  Social Needs  . Financial resource strain: Not on file  . Food insecurity:    Worry: Not on file    Inability: Not on file  . Transportation needs:    Medical: Not on file    Non-medical: Not on file  Tobacco Use  . Smoking status: Former Smoker    Last attempt to quit: 04/01/2018  Years since quitting: 0.0  . Smokeless tobacco: Never Used  Substance and Sexual Activity  . Alcohol use: Yes    Alcohol/week: 84.0 standard drinks    Types: 84 Cans of beer per week    Comment: "about a 12 pack of beer everyday"  . Drug use: No  . Sexual activity: Not on file  Lifestyle  . Physical activity:    Days per week: Not on file    Minutes per session: Not on file  . Stress: Not on file  Relationships  . Social  connections:    Talks on phone: Not on file    Gets together: Not on file    Attends religious service: Not on file    Active member of club or organization: Not on file    Attends meetings of clubs or organizations: Not on file    Relationship status: Not on file  Other Topics Concern  . Not on file  Social History Narrative  . Not on file   Additional Social History:   Sleep: Improving  Appetite:  Fair/improving  Current Medications: Current Facility-Administered Medications  Medication Dose Route Frequency Provider Last Rate Last Dose  . acetaminophen (TYLENOL) tablet 650 mg  650 mg Oral Q6H PRN Sharma Covert, MD      . amLODipine (NORVASC) tablet 5 mg  5 mg Oral Daily Sharma Covert, MD   5 mg at 04/15/18 0809  . antiseptic oral rinse (BIOTENE) solution 15 mL  15 mL Mouth Rinse PRN Sharma Covert, MD   15 mL at 04/15/18 0817  . carvedilol (COREG) tablet 25 mg  25 mg Oral BID WC Sharma Covert, MD   25 mg at 04/15/18 0810  . hydrOXYzine (ATARAX/VISTARIL) tablet 25 mg  25 mg Oral Q6H PRN Laverle Hobby, PA-C   25 mg at 04/15/18 0813  . levETIRAcetam (KEPPRA) tablet 500 mg  500 mg Oral BID Sharma Covert, MD   500 mg at 04/15/18 0810  . magnesium hydroxide (MILK OF MAGNESIA) suspension 5 mL  5 mL Oral QHS PRN Sharma Covert, MD      . OLANZapine [facility]) tablet 20 mg  20 mg Oral QHS Sharma Covert, MD   20 mg at 04/14/18 2118  . OXcarbazepine (TRILEPTAL) tablet 150 mg  150 mg Oral Daily Sharma Covert, MD   150 mg at 04/15/18 6063  . OXcarbazepine (TRILEPTAL) tablet 450 mg  450 mg Oral QHS Sharma Covert, MD   450 mg at 04/14/18 2118  . venlafaxine XR (EFFEXOR-XR) 24 hr capsule 225 mg  225 mg Oral Q breakfast Sharma Covert, MD   225 mg at 04/15/18 0160    Lab Results: No results found for this or any previous visit (from the past 48 hour(s)).  Blood Alcohol level:  Lab Results  Component Value Date   ETH <10 04/01/2018   ETH <5  10/93/2355    Metabolic Disorder Labs: No results found for: HGBA1C, MPG No results found for: PROLACTIN No results found for: CHOL, TRIG, HDL, CHOLHDL, VLDL, LDLCALC  Physical Findings: AIMS: Facial and Oral Movements Muscles of Facial Expression: None, normal Lips and Perioral Area: None, normal Jaw: None, normal Tongue: None, normal,Extremity Movements Upper (arms, wrists, hands, fingers): None, normal Lower (legs, knees, ankles, toes): None, normal, Trunk Movements Neck, shoulders, hips: None, normal, Overall Severity Severity of abnormal movements (highest score from questions above): None, normal Incapacitation due to abnormal movements: None, normal Patient's  awareness of abnormal movements (rate only patient's report): No Awareness, Dental Status Current problems with teeth and/or dentures?: No Does patient usually wear dentures?: No  CIWA:  CIWA-Ar Total: 1 COWS:  COWS Total Score: 2  Musculoskeletal: Strength & Muscle Tone: within normal limits Gait & Station: normal Patient leans: N/A  Psychiatric Specialty Exam: Physical Exam  Nursing note and vitals reviewed. Constitutional: He is oriented to person, place, and time. He appears well-developed and well-nourished.  HENT:  Head: Normocephalic and atraumatic.  Respiratory: Effort normal.  Neurological: He is alert and oriented to person, place, and time.    ROS no chest pain, no shortness of breath, no vomiting  Blood pressure (!) 116/94, pulse 75, temperature 98.2 F (36.8 C), temperature source Oral, resp. rate 18, height 6' (1.829 m), weight 68 kg, SpO2 100 %.Body mass index is 20.34 kg/m.  General Appearance: Fairly groomed  Eye Contact:  Better eye contact today   Speech:  Normal Rate  Volume:  Normal  Mood:  Reports ongoing depression but does acknowledge partial improvement compared to how he felt at admission  Affect:  Remains constricted and anxious but does smile at times appropriately today   Thought Process:  Linear and Descriptions of Associations: Intact  Orientation:  Other:  fully alert and attentive  Thought Content:  No hallucinations, no delusions, not internally preoccupied at this time  Suicidal Thoughts:  No-at this time denies suicidal plan or intention and contracts for safety on unit  Homicidal Thoughts:  No-does not endorse homicidal or violent ideations  Memory:  Recent and remote grossly intact  Judgement:  Fair  Insight:  Fair  Psychomotor Activity:  More visible on unit today  Concentration:  Concentration: Good and Attention Span: Good  Recall:  Good  Fund of Knowledge:  Good  Language:  Good  Akathisia:  Negative  Handed:  Right  AIMS (if indicated):     Assets:  Communication Skills Desire for Improvement Housing Physical Health Resilience  ADL's:  Intact  Cognition:  WNL  Sleep:  Number of Hours: 6   Assessment:  Patient is a 53 year old male.  [employer].  Denies combat experiences.  He reports prior history of chronic mental illness and stresses chronic anxiety is major issue.  As per chart he has been diagnosed with depression, anxiety, schizophrenia in the past.  He attempted suicide on February 19 by overdosing.  Attempt was severe and required ICU setting treatment due to multiorgan compromise.   Patient remains depressed, anxious, ruminative but does endorse moderate/mild improvement compared to yesterday and today speaks about the positive experience of his mother's visit yesterday evening.  Also denies any current suicidal plan or intention and contracts for safety on unit.  Today states that a remote history of ECT treatment may have helped "a little" and would be willing to consider this treatment modality again, particularly if he does not continue to improve with medication management yes  Treatment Plan Summary: Treatment plan reviewed as below today 3/4 Encourage group participation to work on coping skills and symptom  reduction Continue Zyprexa 20 mg nightly for mood disorder/anxiety Continue Trileptal 150 mg every morning and 400 mg nightly for mood disorder Continue Effexor XR 225 mg daily for mood disorder/anxiety Continue Keppra 500 mg twice daily for seizure disorder  Continue Coreg 25 mg twice daily and Norvasc 5 mg daily for hypertension Continue Vistaril 25 mg every 6 hours PRN for anxiety as needed With patient's consent will generate a consult  request to Dr. Weber Cooks regarding potential inpatient ECT as a treatment option Jenne Campus, MD 04/15/2018, 2:06 PM   Patient ID: David Dennis Staff Dennis, male   DOB: 03/17/1965, 53 y.o.   MRN: [id number]

## 2018-04-15 NOTE — BHH Group Notes (Signed)
Occupational Therapy Group Note  Date:  04/15/2018 Time:  12:11 PM  Group Topic/Focus:  Stress Management  Participation Level:  Active  Participation Quality:  Appropriate  Affect:  Flat  Cognitive:  Appropriate  Insight: Improving  Engagement in Group:  Engaged  Modes of Intervention:  Activity, Discussion, Education and Socialization  Additional Comments:    S: non offered this date  O: Stress management group completed to use as productive coping strategy, to help mitigate maladaptive coping to integrate in functional BADL/IADL. Stress management tool worksheet discussed to educate on unhealthy vs healthy coping skills to manage stress to improve community integration. Coping strategies taught include: relaxation based- deep breathing, counting to 10, taking a 1 minute vacation, acceptance, stress balls, relaxation audio/video, visual/mental imagery. Positive mental attitude- gratitude, acceptance, cognitive reframing, positive self talk, anger management. Coping skills bingo played with education given on variety of coping skills between bingo calls. Pts encouraged to share experience with various coping skills and share what has worked for them with others. Coloring and relaxation guide handouts given at the end of the session.   A: Pt presents to group with flat affect, appears attentive and engaged but not offering any subjective feedback at this time.  P: OT group will be x1 per week while pt inpatient.  Dalphine Handing, MSOT, OTR/L Behavioral Health OT/ Acute Relief OT PHP Office: [phone]  Dalphine Handing 04/15/2018, 12:11 PM

## 2018-04-15 NOTE — Progress Notes (Signed)
D: Patient denies SI, HI or AVH this evening. Patient presents as flat, depressed and anxious.  He talks about his social anxiety making it difficult to interact with others at times.  Pt. States, "I don't know who I am if I'm not this" with regards to the behaviors related to his anxiety and depression.  Pt. Was able to talk about one thing he enjoys doing when things are going well for him which is "walking".  Pt. Was able to identify his father as someone he could do that with and seemed to be hopeful at that thought.   A: Patient given emotional support from RN. Patient encouraged to come to staff with concerns and/or questions. Patient's medication routine continued. Patient's orders and plan of care reviewed.   R: Patient remains appropriate and cooperative. Will continue to monitor patient q15 minutes for safety.

## 2018-04-15 NOTE — Plan of Care (Signed)
Progress note  D: pt found in the dayroom interacting; pt compliant with medication administration. Pt denies any physical pain, rating this a 0/10. Pt denies any symptoms. Pt denies si/hi/ah/vh and verbally agrees to approach staff if these become apparent or before harming himself/others while at [facility].  A: pt provided support and encouragement. Pt given medication per protocol and standing orders. Q21m safety checks implemented and continued.  R: pt safe on the unit. Will continue to monitor.   Pt progressing in the following metrics  Problem: Education: Goal: Verbalization of understanding the information provided will improve Outcome: Progressing   Problem: Coping: Goal: Ability to verbalize frustrations and anger appropriately will improve Outcome: Progressing Goal: Ability to demonstrate self-control will improve Outcome: Progressing   Problem: Health Behavior/Discharge Planning: Goal: Identification of resources available to assist in meeting health care needs will improve Outcome: Progressing

## 2018-04-15 NOTE — Tx Team (Signed)
Interdisciplinary Treatment and Diagnostic Plan Update  04/15/2018 Time of Session:  David Dennis MRN: [id number]  Principal Diagnosis: Paranoid schizophrenia [facility])  Secondary Diagnoses: Principal Problem:   Paranoid schizophrenia (HCC) Active Problems:   Overdose   Social anxiety disorder   AKI (acute kidney injury) (HCC)   MDD (major depressive disorder), severe (HCC)   Current Medications:  Current Facility-Administered Medications  Medication Dose Route Frequency Provider Last Rate Last Dose  . acetaminophen (TYLENOL) tablet 650 mg  650 mg Oral Q6H PRN Antonieta Pert, MD      . amLODipine (NORVASC) tablet 5 mg  5 mg Oral Daily Antonieta Pert, MD   5 mg at 04/15/18 0809  . antiseptic oral rinse (BIOTENE) solution 15 mL  15 mL Mouth Rinse PRN Antonieta Pert, MD   15 mL at 04/15/18 0817  . carvedilol (COREG) tablet 25 mg  25 mg Oral BID WC Antonieta Pert, MD   25 mg at 04/15/18 0810  . hydrOXYzine (ATARAX/VISTARIL) tablet 25 mg  25 mg Oral Q6H PRN Kerry Hough, PA-C   25 mg at 04/15/18 0813  . levETIRAcetam (KEPPRA) tablet 500 mg  500 mg Oral BID Antonieta Pert, MD   500 mg at 04/15/18 0810  . magnesium hydroxide (MILK OF MAGNESIA) suspension 5 mL  5 mL Oral QHS PRN Antonieta Pert, MD      . OLANZapine [facility]) tablet 20 mg  20 mg Oral QHS Antonieta Pert, MD   20 mg at 04/14/18 2118  . OXcarbazepine (TRILEPTAL) tablet 150 mg  150 mg Oral Daily Antonieta Pert, MD   150 mg at 04/15/18 1483  . OXcarbazepine (TRILEPTAL) tablet 450 mg  450 mg Oral QHS Antonieta Pert, MD   450 mg at 04/14/18 2118  . venlafaxine XR (EFFEXOR-XR) 24 hr capsule 225 mg  225 mg Oral Q breakfast Antonieta Pert, MD   225 mg at 04/15/18 0735   PTA Medications: Medications Prior to Admission  Medication Sig Dispense Refill Last Dose  . carvedilol (COREG) 6.25 MG tablet Take 1 tablet (6.25 mg total) by mouth 2 (two) times daily with a meal.     . feeding  supplement, ENSURE ENLIVE, (ENSURE ENLIVE) LIQD Take 237 mLs by mouth 2 (two) times daily between meals. 237 mL 12   . folic acid (FOLVITE) 1 MG tablet Take 1 tablet (1 mg total) by mouth at bedtime.     . gabapentin (NEURONTIN) 100 MG capsule Take 1 capsule (100 mg total) by mouth 2 (two) times daily.     Marland Kitchen levETIRAcetam (KEPPRA) 500 MG tablet Take 1 tablet (500 mg total) by mouth 2 (two) times daily. 60 tablet 0   . Multiple Vitamins-Minerals (MULTIVITAL PO) Take 1 tablet by mouth daily.   november  . QUEtiapine (SEROQUEL) 25 MG tablet Take 1 tablet (25 mg total) by mouth daily as needed (at bedtime for sleep).     . thiamine 100 MG tablet Take 1 tablet (100 mg total) by mouth at bedtime.       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   Physician Treatment Plan for Primary Diagnosis: Paranoid schizophrenia (HCC) Long Term Goal(s): Improvement in symptoms so as ready for discharge Improvement in symptoms so as ready for discharge  Short Term Goals: Ability to identify changes in lifestyle to reduce recurrence of condition will improve Ability to verbalize feelings will improve Ability to disclose and discuss suicidal ideas Ability to demonstrate self-control will improve Ability to identify and develop effective coping behaviors will improve Ability to maintain clinical measurements within normal limits will improve Compliance with prescribed medications will improve Ability to identify changes in lifestyle to reduce recurrence of condition will improve Ability to verbalize feelings will improve Ability to disclose and discuss suicidal ideas Ability to demonstrate self-control will improve Ability to  identify and develop effective coping behaviors will improve Ability to maintain clinical measurements within normal limits will improve Compliance with prescribed medications will improve  Medication Management: Evaluate patient's response, side effects, and tolerance of medication regimen.  Therapeutic Interventions: 1 to 1 sessions, Unit Group sessions and Medication administration.  Evaluation of Outcomes: Progressing  Physician Treatment Plan for Secondary Diagnosis: Principal Problem:   Paranoid schizophrenia (HCC) Active Problems:   Overdose   Social anxiety disorder   AKI (acute kidney injury) (HCC)   MDD (major depressive disorder), severe (HCC)  Long Term Goal(s): Improvement in symptoms so as ready for discharge Improvement in symptoms so as ready for discharge   Short Term Goals: Ability to identify changes in lifestyle to reduce recurrence of condition will improve Ability to verbalize feelings will improve Ability to disclose and discuss suicidal ideas Ability to demonstrate self-control will improve Ability to identify and develop effective coping behaviors will improve Ability to maintain clinical measurements within normal limits will improve Compliance with prescribed medications will improve Ability to identify changes in lifestyle to reduce recurrence of condition will improve Ability to verbalize feelings will improve Ability to disclose and discuss suicidal ideas Ability to demonstrate self-control will improve Ability to identify and develop effective coping behaviors will improve Ability to maintain clinical measurements within normal limits will improve Compliance with prescribed medications will improve     Medication Management: Evaluate patient's response, side effects, and tolerance of medication regimen.  Therapeutic Interventions: 1 to 1 sessions, Unit Group sessions and Medication administration.  Evaluation of Outcomes: Progressing   RN  Treatment Plan for Primary Diagnosis: Paranoid schizophrenia (HCC) Long Term Goal(s): Knowledge of disease and therapeutic regimen to maintain health will improve  Short Term Goals: Ability to participate in decision making will improve, Ability to verbalize feelings will improve, Ability to disclose and discuss suicidal ideas, Ability to identify and develop effective coping behaviors will improve and Compliance with prescribed medications will improve  Medication Management: RN will administer medications as ordered by provider, will assess and evaluate patient's response and provide education to patient for prescribed medication. RN will report any adverse and/or side effects to prescribing provider.  Therapeutic Interventions: 1 on 1 counseling sessions, Psychoeducation, Medication administration, Evaluate responses to treatment, Monitor vital signs and CBGs as ordered, Perform/monitor CIWA, COWS, AIMS and Fall Risk screenings as ordered, Perform wound care treatments as ordered.  Evaluation of Outcomes: Progressing   LCSW Treatment Plan for Primary Diagnosis: Paranoid schizophrenia (HCC) Long Term Goal(s): Safe transition to appropriate next level of care at discharge, Engage patient in therapeutic group addressing interpersonal concerns.  Short Term Goals: Engage patient in aftercare planning with referrals and resources  Therapeutic Interventions: Assess for all discharge needs, 1 to 1 time with Social worker, Explore available resources and support systems, Assess for adequacy in community support network, Educate family and significant other(s) on suicide prevention, Complete Psychosocial Assessment, Interpersonal group therapy.  Evaluation of Outcomes: Progressing   Progress in Treatment: Attending groups: Yes. Participating in groups: Yes. Minimally  Taking medication as prescribed: Yes. Toleration medication: Yes. Family/Significant other contact made: Yes, individual(s)  contacted:  the patient's mother Patient understands diagnosis: Yes. Discussing patient identified problems/goals with staff: Yes. Medical problems stabilized or resolved: Yes. Denies suicidal/homicidal ideation: Yes. Issues/concerns per patient self-inventory: No. Other:   New problem(s) identified: None   New Short Term/Long Term Goal(s): medication stabilization, elimination of SI thoughts, development of comprehensive mental wellness plan.   Patient Goals:  Help with depression and anxiety   Discharge Plan or Barriers: Patient lives at home with [place] mother in [place], [place]. He reports he [facility], every three months. Patient is [facility] for outpatient medication management and therapy services at discharge. CSW will continue to follow and assess for appropriate referrals and discharge planning.   Reason for Continuation of Hospitalization: Anxiety Depression Medication stabilization Suicidal ideation  Estimated Length of Stay: 04/15/2018  Attendees: Patient: 04/15/2018 2:07 PM  Physician: Dr. Landry MellowGreg Clary, MD; Dr. Nehemiah MassedFernando Cobos, MD 04/15/2018 2:07 PM  Nursing: Lanora ManisElizabeth.Archie Balboa,RN; Michael.Kathie RhodesS, RN 04/15/2018 2:07 PM  RN Care Manager: 04/15/2018 2:07 PM  Social Worker: Baldo DaubJolan Keona Sheffler, LCSWA 04/15/2018 2:07 PM  Recreational Therapist:  04/15/2018 2:07 PM  Other: Marciano SequinJanet Sykes, NP  04/15/2018 2:07 PM  Other:  04/15/2018 2:07 PM  Other: 04/15/2018 2:07 PM    Scribe for Treatment Team: Maeola SarahJolan E Urijah Arko, LCSWA 04/15/2018 2:07 PM

## 2018-04-16 MED ORDER — AMLODIPINE BESYLATE 10 MG PO TABS
10.0000 mg | ORAL_TABLET | Freq: Every day | ORAL | Status: DC
  Administered 2018-04-17 – 2018-04-21 (×5): 10 mg via ORAL
  Filled 2018-04-16 (×2): qty 1
  Filled 2018-04-16: qty 7
  Filled 2018-04-16 (×6): qty 1

## 2018-04-16 NOTE — Progress Notes (Addendum)
[facility] MD Progress Note  04/16/2018 4:25 PM David Dennis  MRN:  [id number] Subjective: Patient reports she is feeling "a little better".  He continues to report to depression, anxiety, particularly regarding social interaction/groups, and states, for example, that he sometimes ruminates about maybe having offended somebody following an interaction with them.  At this time denies active suicidal ideations, contracts for safety, and states he has had no recent thoughts of being better off dead or other passive SI today. Does not endorse medication side effects.   Objective: I have discussed case with treatment team and have met with patient.  Patient is a 53 year old male.  [employer].  Denies combat experiences.  He reports prior history of chronic mental illness and stresses chronic anxiety is major issue.  As per chart he has been diagnosed with depression, anxiety, schizophrenia in the past.  He attempted suicide on February 19 by overdosing.  Attempt was severe and required ICU setting treatment due to multiorgan compromise.  Patient describes, as above, some improvement compared to admission.  States he notices a partially improved mood.  Denies suicidal ideations today, contracts for safety.  He is noticed to be more visible on unit, somewhat more relaxed during session/interactions.  He does continue to report significant social anxiety, which she describes as chronic, starting as a teenager or young adult.  With his expressed consent I have spoken with his parents via phone.  They corroborate that patient has a long history of anxiety/social anxiety but that it had recently worsened to the point where he rarely left the house prior to admission.  They have noticed a partial improvement compared to how he presented prior to admission. Patient has expressed some interest in ECT as a treatment option, but since he is improving gradually on current medication regimen states he might consider it  in the future should symptoms worsen again.  His mother states that she thinks he might benefit from trans-magnetic cranial stimulation treatment, which he has not had before.   Principal Problem: Paranoid schizophrenia ([place]) Diagnosis: Principal Problem:   Paranoid schizophrenia ([place]) Active Problems:   Overdose   Social anxiety disorder   AKI (acute kidney injury) ([place])   MDD (major depressive disorder), severe ([place])  Total Time spent with patient: 20 minutes  Past Psychiatric History: See admission H&P  Past Medical History:  Past Medical History:  Diagnosis Date  . Depression   . Generalized anxiety disorder   . Hypertension    History reviewed. No pertinent surgical history. Family History: History reviewed. No pertinent family history. Family Psychiatric  History: See admission H&P Social History:  Social History   Substance and Sexual Activity  Alcohol Use Yes  . Alcohol/week: 84.0 standard drinks  . Types: 84 Cans of beer per week   Comment: "about a 12 pack of beer everyday"     Social History   Substance and Sexual Activity  Drug Use No    Social History   Socioeconomic History  . Marital status: Single    Spouse name: Not on file  . Number of children: Not on file  . Years of education: Not on file  . Highest education level: Not on file  Occupational History  . Not on file  Social Needs  . Financial resource strain: Not on file  . Food insecurity:    Worry: Not on file    Inability: Not on file  . Transportation needs:    Medical: Not on file  Non-medical: Not on file  Tobacco Use  . Smoking status: Former Smoker    Last attempt to quit: 04/01/2018    Years since quitting: 0.0  . Smokeless tobacco: Never Used  Substance and Sexual Activity  . Alcohol use: Yes    Alcohol/week: 84.0 standard drinks    Types: 84 Cans of beer per week    Comment: "about a 12 pack of beer everyday"  . Drug use: No  . Sexual activity: Not on file  Lifestyle   . Physical activity:    Days per week: Not on file    Minutes per session: Not on file  . Stress: Not on file  Relationships  . Social connections:    Talks on phone: Not on file    Gets together: Not on file    Attends religious service: Not on file    Active member of club or organization: Not on file    Attends meetings of clubs or organizations: Not on file    Relationship status: Not on file  Other Topics Concern  . Not on file  Social History Narrative  . Not on file   Additional Social History:   Sleep: Partially improved  Appetite:  Partially improved  Current Medications: Current Facility-Administered Medications  Medication Dose Route Frequency Provider Last Rate Last Dose  . acetaminophen (TYLENOL) tablet 650 mg  650 mg Oral Q6H PRN Sharma Covert, MD      . Derrill Memo ON 04/17/2018] amLODipine (NORVASC) tablet 10 mg  10 mg Oral Daily Cobos, Fernando A, MD      . antiseptic oral rinse (BIOTENE) solution 15 mL  15 mL Mouth Rinse PRN Sharma Covert, MD   15 mL at 04/16/18 1138  . carvedilol (COREG) tablet 25 mg  25 mg Oral BID WC Sharma Covert, MD   25 mg at 04/16/18 0800  . hydrOXYzine (ATARAX/VISTARIL) tablet 25 mg  25 mg Oral Q6H PRN Laverle Hobby, PA-C   25 mg at 04/16/18 1316  . levETIRAcetam (KEPPRA) tablet 500 mg  500 mg Oral BID Sharma Covert, MD   500 mg at 04/16/18 0800  . magnesium hydroxide (MILK OF MAGNESIA) suspension 5 mL  5 mL Oral QHS PRN Sharma Covert, MD      . OLANZapine [facility]) tablet 20 mg  20 mg Oral QHS Sharma Covert, MD   20 mg at 04/15/18 2107  . OXcarbazepine (TRILEPTAL) tablet 150 mg  150 mg Oral Daily Sharma Covert, MD   150 mg at 04/16/18 0800  . OXcarbazepine (TRILEPTAL) tablet 450 mg  450 mg Oral QHS Sharma Covert, MD   450 mg at 04/15/18 2107  . venlafaxine XR (EFFEXOR-XR) 24 hr capsule 225 mg  225 mg Oral Q breakfast Sharma Covert, MD   225 mg at 04/16/18 0800    Lab Results: No results found  for this or any previous visit (from the past 48 hour(s)).  Blood Alcohol level:  Lab Results  Component Value Date   ETH <10 04/01/2018   ETH <5 85/88/5027    Metabolic Disorder Labs: No results found for: HGBA1C, MPG No results found for: PROLACTIN No results found for: CHOL, TRIG, HDL, CHOLHDL, VLDL, LDLCALC  Physical Findings: AIMS: Facial and Oral Movements Muscles of Facial Expression: None, normal Lips and Perioral Area: None, normal Jaw: None, normal Tongue: None, normal,Extremity Movements Upper (arms, wrists, hands, fingers): None, normal Lower (legs, knees, ankles, toes): None, normal, Trunk Movements  Neck, shoulders, hips: None, normal, Overall Severity Severity of abnormal movements (highest score from questions above): None, normal Incapacitation due to abnormal movements: None, normal Patient's awareness of abnormal movements (rate only patient's report): No Awareness, Dental Status Current problems with teeth and/or dentures?: No Does patient usually wear dentures?: No  CIWA:  CIWA-Ar Total: 1 COWS:  COWS Total Score: 2  Musculoskeletal: Strength & Muscle Tone: within normal limits Gait & Station: normal Patient leans: N/A  Psychiatric Specialty Exam: Physical Exam  Nursing note and vitals reviewed. Constitutional: He is oriented to person, place, and time. He appears well-developed and well-nourished.  HENT:  Head: Normocephalic and atraumatic.  Respiratory: Effort normal.  Neurological: He is alert and oriented to person, place, and time.    ROS no chest pain, no shortness of breath, no vomiting  Blood pressure (!) 131/96, pulse 70, temperature 98.2 F (36.8 C), temperature source Oral, resp. rate 18, height 6' (1.829 m), weight 68 kg, SpO2 100 %.Body mass index is 20.34 kg/m.  General Appearance: Fairly groomed  Eye Contact:  Improved eye contact noted   Speech:  Normal Rate  Volume:  Normal  Mood:  Still depressed, but endorses some improvement  compared to how he felt at admission  Affect:  Remains anxious, less constricted, smiles briefly at times appropriately  Thought Process:  Linear and Descriptions of Associations: Intact  Orientation:  Other:  fully alert and attentive  Thought Content:  No hallucinations, no delusions, not internally preoccupied at this time  Suicidal Thoughts:  No-today denies any suicidal or self-injurious ideations, contracts for safety on unit, does not endorse homicidal or violent thoughts  Homicidal Thoughts:  No  Memory:  Recent and remote grossly intact  Judgement:  Other:  Fair/improving  Insight:  Improving  Psychomotor Activity:  Normal-more visible on unit  Concentration:  Concentration: Good and Attention Span: Good  Recall:  Good  Fund of Knowledge:  Good  Language:  Good  Akathisia:  Negative  Handed:  Right  AIMS (if indicated):     Assets:  Communication Skills Desire for Improvement Housing Physical Health Resilience  ADL's:  Intact  Cognition:  WNL  Sleep:  Number of Hours: 6   Assessment:  Patient is a 53 year old male.  [employer].  Denies combat experiences.  He reports prior history of chronic mental illness and stresses chronic anxiety is major issue.  As per chart he has been diagnosed with depression, anxiety, schizophrenia in the past.  He attempted suicide on February 19 by overdosing.  Attempt was severe and required ICU setting treatment due to multiorgan compromise.   Patient is presenting with partial/limited improvement compared to admission.  Improved eye contact, slightly improved grooming, partially improved affect noted.  He does continue to report significant/chronic anxiety described mostly as social anxiety, but is noted to be more visible on unit/less isolative.  His parents also describe a partial improvement compared to how he presented on admission. Thus far does not endorse medication side effects.  He expresses interest in ECT as an option in the future  should his mood not continue to improve.  His mother, with whom I spoke on the phone, states she would be interested in him being referred for trans-magnetic cranial stimulation, which she has not been treated with before.  Blood pressure remains elevated, without associated symptoms.  Today BP is 131/96  Treatment Plan Summary: Treatment plan reviewed as below today 3/3 Encourage group participation to work on coping skills and symptom  reduction Continue Zyprexa 20 mg nightly for mood disorder/anxiety Continue Trileptal 150 mg every morning and 400 mg nightly for mood disorder Continue Effexor XR 225 mg daily for mood disorder/anxiety Continue Keppra 500 mg twice daily for seizure disorder  Continue Coreg 25 mg twice daily and increase  Norvasc to 10  mg daily for hypertension Continue Vistaril 25 mg every 6 hours PRN for anxiety as needed Recheck BMP in AM , to monitor Na+ as on Oxcarbamazepine. Check HgbA1C, Lipid Panel as on Olanzapine. Jenne Campus, MD 04/16/2018, 4:25 PM   Patient ID: David Dennis, male   DOB: 11/02/1965, 53 y.o.   MRN: [id number]

## 2018-04-16 NOTE — [facility] Group Notes (Signed)
[facility] Group Therapy      04/16/2018 2:04 PM  Type of Therapy: Mental [employer] Presentation  Participation Level: Active  Participation Quality: Attentive  Affect: Appropriate  Cognitive: Oriented  Insight: Developing/Improving  Engagement in Therapy: Engaged  Modes of Intervention: Discussion, Education and Socialization  Summary of Progress/Problems: Mental [employer] Association (MHA) Speaker came to talk about his personal journey with mental health. The pt processed ways by which to relate to the speaker. MHA speaker provided handouts and educational information pertaining to groups and services offered by the MHA. Pt was engaged in speaker's presentation and was receptive to resources provided.    Nimrit Kehres LCSWA Clinical Social Worker   

## 2018-04-16 NOTE — Plan of Care (Signed)
D: Patient is in his room on approach. Patient is alert, oriented, and cooperative. Denies SI, HI, AVH, and verbally contracts for safety. Patient denies physical symptoms/pain. Patient reports anxiety/paranoia are feeling better with vistaril. Patient rates his day 5/10 because he's feeling lots of ups and downs, hopelessness, and fragility. Patient took nighttime medications as soon as possible and went back to his room.    A: Scheduled medications administered per MD order. Support provided. Patient educated on safety on the unit and medications. Routine safety checks every 15 minutes. Patient stated understanding to tell nurse about any new physical symptoms. Patient understands to tell staff of any needs.     R: No adverse drug reactions noted. Patient verbally contracts for safety. Patient remains safe at this time and will continue to monitor.   Problem: Education: Goal: Emotional status will improve Outcome: Progressing   Problem: Safety: Goal: Periods of time without injury will increase Outcome: Progressing   Patient reports anxiety/paranoia are feeling better with vistaril. Patient remains safe and will continue to monitor.

## 2018-04-16 NOTE — Plan of Care (Signed)
Progress note  D: pt found in bed; compliant with medication administration. Pt states he slept well. Pt rates his depression/hopelessness/anxiety a 4/5/3 out of 10 respectively. Pt denies any physical symptoms or pain, rating his pain a 0/10. Pt states his goal for today is to relax through coping skills and will achieve this by practicing coping skills. Pt denies si/hi/ah/vh and verbally agrees to approach staff if these become apparent or before harming himself/others while at [facility]. Pt has been visibly anxious and pacing today.  A: pt provided support and encouragement. Pt given medication per protocol and standing orders. Q79m safety checks implemented and continued.  R: pt safe on the unit. Will continue to monitor.   Pt progressing in the following metrics  Problem: Physical Regulation: Goal: Ability to maintain clinical measurements within normal limits will improve Outcome: Progressing   Problem: Education: Goal: Utilization of techniques to improve thought processes will improve Outcome: Progressing Goal: Knowledge of the prescribed therapeutic regimen will improve Outcome: Progressing   Problem: Activity: Goal: Interest or engagement in leisure activities will improve Outcome: Progressing

## 2018-04-16 NOTE — Progress Notes (Signed)
Pt attended wrap-up group tonight. Pt appears flat/depressed/anxious/isolative in affect and mood. Pt denies SI/HI/AVH/Pain at this time. Pt c/o of ongoing anxiety. Pt notice to pace the hallway often. Pt states he is lonely and "overthink" things. Support provided. Therapeutic discussion 1:1 given to Pt. Will continue with POC.

## 2018-04-17 ENCOUNTER — Encounter (HOSPITAL_COMMUNITY): Payer: Self-pay | Admitting: Registered Nurse

## 2018-04-17 LAB — BASIC METABOLIC PANEL
Anion gap: 8 (ref 5–15)
BUN: 18 mg/dL (ref 6–20)
CO2: 30 mmol/L (ref 22–32)
CREATININE: 0.87 mg/dL (ref 0.61–1.24)
Calcium: 9.3 mg/dL (ref 8.9–10.3)
Chloride: 104 mmol/L (ref 98–111)
GFR calc non Af Amer: >60 mL/min (ref >60)
Glucose, Bld: 86 mg/dL (ref 70–99)
Potassium: 3.7 mmol/L (ref 3.5–5.1)
Sodium: 142 mmol/L (ref 135–145)

## 2018-04-17 LAB — LIPID PANEL
Cholesterol: 182 mg/dL (ref 0–200)
HDL: 42 mg/dL (ref >40)
LDL Cholesterol: 130 mg/dL — ABNORMAL HIGH (ref 0–99)
Total CHOL/HDL Ratio: 4.3 RATIO
Triglycerides: 51 mg/dL (ref <150)
VLDL: 10 mg/dL (ref 0–40)

## 2018-04-17 LAB — HEMOGLOBIN A1C
Hgb A1c MFr Bld: 5.5 % (ref 4.8–5.6)
MEAN PLASMA GLUCOSE: 111.15 mg/dL

## 2018-04-17 NOTE — Progress Notes (Addendum)
Pt attended wrap-up group tonight. Pt appears flat/depressed/anxious/isolative in affect and mood. Pt denies SI/HI/AVH/Pain at this time. Pt c/o of ongoing anxiety. Pt notice to pace the hallway a lot. Pt preoccupied with taking vistaril. Will continue with POC.

## 2018-04-17 NOTE — Progress Notes (Signed)
D: Pt alert and oriented. Pt rates depression 3/10, hopelessness 4/10, and anxiety 3/10.Pt goal: stay positive at least 50% of the day and plans to achieve this goal by seeing good aspects of whatever types, relax and think. Pt reports energy leave as normal and concentration as being good. Pt reports sleep last night as being good. Pt did receive medications for sleep. Pt reports denies pain. Pt denies experiencing any SI/HI, or AVH at this time.   Pt is very anxious when it comes to interacting with others and attending group. Pt did attend more than one group and agreed to allow the Physician to interview him in the presence of his PA student. Pt has been more interactive today but struggles with seeing the good in being uncomfortable while attending group sessions.   A: Scheduled medications administered to pt, per MD orders. Support and encouragement provided. Frequent verbal contact made. Routine safety checks conducted q15 minutes.   R: No adverse drug reactions noted. Pt verbally contracts for safety at this time. Pt complaint with medications and treatment plan. Pt interacts very minimally with others on the unit then returns to room. Pt attended 2 groups today and spent no free time in the dayroom. Pt remains safe at this time. Will continue to monitor.

## 2018-04-17 NOTE — Progress Notes (Signed)
Adult Psychoeducational Group Note  Date:  04/17/2018 Time:  3:41 AM  Group Topic/Focus:  Wrap-Up Group:   The focus of this group is to help patients review their daily goal of treatment and discuss progress on daily workbooks.  Participation Level:  Active  Participation Quality:  Appropriate and Attentive  Affect:  Appropriate  Cognitive:  Alert and Appropriate  Insight: Appropriate and Good  Engagement in Group:  Engaged  Modes of Intervention:  Discussion  Additional Comments:  Pt said his day was a 7. The one positive thing today he saw his family and it was a good visit . The one thing he will take away good to point out positive things in life.  David Dennis 04/17/2018, 3:41 AM

## 2018-04-17 NOTE — Progress Notes (Addendum)
[facility]  04/17/2018 10:18 AM David ElkWilliam J David Dennis David Dennis  MRN:  [id number]   Subjective: Reports that he is feeling "little better"  Reports that he did attend a group session "I spoke up this morning and told what my goals were."  Reports that he is still having some paranoia but it is better; states related to hearing things that are said by other and adding extra meaning to what was said.  States that social anxiety is one of his main stressors "Like if my family had company; I didn't want to be around anyone; I would isolate myself; by just staying away.  Then I get lonely because of the isolation, the frustration because of being weird.  At this time patient feel good that he was able to participate in group sessions.  Tolerating medication without adverse reactions.   Denies passive suicidal thought (of being better off dead) and suicidal ideation.  ,    Objective: David PancoastWilliam J David Dennis David Dennis, 53 y.o., male patient seen face to face by this provider; chart reviewed and discussed with Dr. Jama Flavorsobos and treatment team on 04/17/18.  On evaluation David ElkWilliam J David Dennis David Dennis was found in Day room interacting with peers.  Patient continue to be seen out of room and on the unit.  Patient felt a sense of accomplishment when he was able to speak out during group session and plans to continue participation.  Feels that he is making improvements.  Patient want to be able to be around others without the social anxiety that he feels is what keeps him isolated.  Patient willing to go to group sessions and stay out of room as much as possible.  Patient states that he is tolerating medications without adverse reaction; eating/sleeping without difficulty.  At this time patient denies suicidal/self-harm/homicidal ideation, and  Psychosis.  Continues to have some paranoia related to  and paranoia when hearing the conversations of other stating "I more meaning into than other people do."     Principal Problem: Paranoid  schizophrenia (HCC) Diagnosis: Principal Problem:   Paranoid schizophrenia (HCC) Active Problems:   Overdose   Social anxiety disorder   AKI (acute kidney injury) (HCC)   MDD (major depressive disorder), severe (HCC)  Total Time spent with patient: 20 minutes  Past Psychiatric History: See admission H&P  Past Medical History:  Past Medical History:  Diagnosis Date  . Depression   . Generalized anxiety disorder   . Hypertension    History reviewed. No pertinent surgical history. Family History: History reviewed. No pertinent family history. Family Psychiatric  History: See admission H&P Social History:  Social History   Substance and Sexual Activity  Alcohol Use Yes  . Alcohol/week: 84.0 standard drinks  . Types: 84 Cans of beer per week   Comment: "about a 12 pack of beer everyday"     Social History   Substance and Sexual Activity  Drug Use No    Social History   Socioeconomic History  . Marital status: Single    Spouse name: Not on file  . Number of children: Not on file  . Years of education: Not on file  . Highest education level: Not on file  Occupational History  . Not on file  Social Needs  . Financial resource strain: Not on file  . Food insecurity:    Worry: Not on file    Inability: Not on file  . Transportation needs:    Medical: Not on file    Non-medical:  Not on file  Tobacco Use  . Smoking status: Former Smoker    Last attempt to quit: 04/01/2018    Years since quitting: 0.0  . Smokeless tobacco: Never Used  Substance and Sexual Activity  . Alcohol use: Yes    Alcohol/week: 84.0 standard drinks    Types: 84 Cans of beer per week    Comment: "about a 12 pack of beer everyday"  . Drug use: No  . Sexual activity: Not on file  Lifestyle  . Physical activity:    Days per week: Not on file    Minutes per session: Not on file  . Stress: Not on file  Relationships  . Social connections:    Talks on phone: Not on file    Gets together: Not  on file    Attends religious service: Not on file    Active member of club or organization: Not on file    Attends meetings of clubs or organizations: Not on file    Relationship status: Not on file  Other Topics Concern  . Not on file  Social History Narrative  . Not on file   Additional Social History:   Sleep: Continues to improve.  ;   Appetite:  Continues to improve  Current Medications: Current Facility-Administered Medications  Medication Dose Route Frequency Provider Last Rate Last Dose  . acetaminophen (TYLENOL) tablet 650 mg  650 mg Oral Q6H PRN Antonieta Pert, MD      . amLODipine (NORVASC) tablet 10 mg  10 mg Oral Daily Cobos, Rockey Situ, MD   10 mg at 04/17/18 0808  . antiseptic oral rinse (BIOTENE) solution 15 mL  15 mL Mouth Rinse PRN Antonieta Pert, MD   15 mL at 04/16/18 1138  . carvedilol (COREG) tablet 25 mg  25 mg Oral BID WC Antonieta Pert, MD   25 mg at 04/17/18 9163  . hydrOXYzine (ATARAX/VISTARIL) tablet 25 mg  25 mg Oral Q6H PRN Kerry Hough, PA-C   25 mg at 04/17/18 8466  . levETIRAcetam (KEPPRA) tablet 500 mg  500 mg Oral BID Antonieta Pert, MD   500 mg at 04/17/18 5993  . magnesium hydroxide (MILK OF MAGNESIA) suspension 5 mL  5 mL Oral QHS PRN Antonieta Pert, MD      . OLANZapine [facility]) tablet 20 mg  20 mg Oral QHS Antonieta Pert, MD   20 mg at 04/16/18 2103  . OXcarbazepine (TRILEPTAL) tablet 150 mg  150 mg Oral Daily Antonieta Pert, MD   150 mg at 04/17/18 5701  . OXcarbazepine (TRILEPTAL) tablet 450 mg  450 mg Oral QHS Antonieta Pert, MD   450 mg at 04/16/18 2103  . venlafaxine XR (EFFEXOR-XR) 24 hr capsule 225 mg  225 mg Oral Q breakfast Antonieta Pert, MD   225 mg at 04/17/18 0807    Lab Results:  Results for orders placed or performed during the hospital encounter of 04/08/18 (from the past 48 hour(s))  Basic metabolic panel     Status: None   Collection Time: 04/17/18  6:36 AM  Result Value Ref Range    Sodium 142 135 - 145 mmol/L   Potassium 3.7 3.5 - 5.1 mmol/L   Chloride 104 98 - 111 mmol/L   CO2 30 22 - 32 mmol/L   Glucose, Bld 86 70 - 99 mg/dL   BUN 18 6 - 20 mg/dL   Creatinine, Ser 7.79 0.61 - 1.24 mg/dL  Calcium 9.3 8.9 - 10.3 mg/dL   GFR calc non Af Amer >60 >60 mL/min   GFR calc Af Amer >60 >60 mL/min   Anion gap 8 5 - 15    Comment: Performed at [facility], [address], [place], [place] [address]  Hemoglobin A1c     Status: None   Collection Time: 04/17/18  6:36 AM  Result Value Ref Range   Hgb A1c MFr Bld 5.5 4.8 - 5.6 %    Comment: (NOTE) Pre diabetes:          5.7%-6.4% Diabetes:              >6.4% Glycemic control for   <7.0% adults with diabetes    Mean Plasma Glucose 111.15 mg/dL    Comment: Performed at [facility], [address], [place], [place] [address]  Lipid panel     Status: Abnormal   Collection Time: 04/17/18  6:36 AM  Result Value Ref Range   Cholesterol 182 0 - 200 mg/dL   Triglycerides 51 <865 mg/dL   HDL 42 >78 mg/dL   Total CHOL/HDL Ratio 4.3 RATIO   VLDL 10 0 - 40 mg/dL   LDL Cholesterol 469 (H) 0 - 99 mg/dL    Comment:        Total Cholesterol/HDL:CHD Risk Coronary Heart Disease Risk Table                     Men   Women  1/2 Average Risk   3.4   3.3  Average Risk       5.0   4.4  2 X Average Risk   9.6   7.1  3 X Average Risk  23.4   11.0        Use the calculated Patient Ratio above and the CHD Risk Table to determine the patient's CHD Risk.        ATP David Dennis CLASSIFICATION (LDL):  <100     mg/dL   Optimal  629-528  mg/dL   Near or Above                    Optimal  130-159  mg/dL   Borderline  413-244  mg/dL   High  >010     mg/dL   Very High Performed at [facility], [address], [place], [place] [address]     Blood Alcohol level:  Lab Results  Component Value Date   ETH <10 04/01/2018   ETH <5 08/10/2015    Metabolic Disorder Labs: Lab Results  Component Value  Date   HGBA1C 5.5 04/17/2018   MPG 111.15 04/17/2018   No results found for: PROLACTIN Lab Results  Component Value Date   CHOL 182 04/17/2018   TRIG 51 04/17/2018   HDL 42 04/17/2018   CHOLHDL 4.3 04/17/2018   VLDL 10 04/17/2018   LDLCALC 130 (H) 04/17/2018    Physical Findings: AIMS: Facial and Oral Movements Muscles of Facial Expression: None, normal Lips and Perioral Area: None, normal Jaw: None, normal Tongue: None, normal,Extremity Movements Upper (arms, wrists, hands, fingers): None, normal Lower (legs, knees, ankles, toes): None, normal, Trunk Movements Neck, shoulders, hips: None, normal, Overall Severity Severity of abnormal movements (highest score from questions above): None, normal Incapacitation due to abnormal movements: None, normal Patient's awareness of abnormal movements (rate only patient's report): No Awareness, Dental Status Current problems with teeth and/or dentures?: No Does patient usually wear  dentures?: No  CIWA:  CIWA-Ar Total: 1 COWS:  COWS Total Score: 2  Musculoskeletal: Strength & Muscle Tone: within normal limits Gait & Station: normal Patient leans: N/A  Psychiatric Specialty Exam: Physical Exam  Nursing note and vitals reviewed. Constitutional: He is oriented to person, place, and time. He appears well-developed and well-nourished.  HENT:  Head: Normocephalic and atraumatic.  Neck: Normal range of motion.  Respiratory: Effort normal.  Musculoskeletal: Normal range of motion.  Neurological: He is alert and oriented to person, place, and time.  Skin: Skin is warm and dry.  Psychiatric: His mood appears anxious. He exhibits a depressed mood. He expresses no homicidal and no suicidal ideation.    Review of Systems  Psychiatric/Behavioral: Depression: at this time rates 3/10. Hallucinations: Denies at this time. Suicidal ideas: Denies at this time. The patient does not have insomnia. Nervous/anxious: Reports some improvement rates 3/10  at this time.    no chest pain, no shortness of breath, no vomiting  Blood pressure (!) 132/96, pulse 71, temperature 98.1 F (36.7 C), temperature source Oral, resp. rate 18, height 6' (1.829 m), weight 68 kg, SpO2 100 %.Body mass index is 20.34 kg/m.  General Appearance: Neat  Eye Contact:  Good   Speech:  Normal Rate  Volume:  Normal  Mood:  Depressed and Feels improvement rates 3/10  Affect:  Continues to be anxious; related more to social anxiety; visibly seen out of room more and interacting with others    Thought Process:  Linear and Descriptions of Associations: Intact  Orientation:  Full (Time, Place, and Person)  Thought Content:  No hallucinations, no delusions, not internally preoccupied at this time  Suicidal Thoughts:  No-Continues to deny suicidal  ideations, contracts for safety on unit, does not endorse homicidal or violent thoughts  Homicidal Thoughts:  No  Memory:  Recent and remote grossly intact  Judgement:  Other:  Fair/improving  Insight:  Improving  Psychomotor Activity:  Normal- Continue to be more visible on unit and seen interacting with peers  Concentration:  Concentration: Good and Attention Span: Good  Recall:  Good  Fund of Knowledge:  Good  Language:  Good  Akathisia:  Negative  Handed:  Right  AIMS (if indicated):     Assets:  Communication Skills Desire for Improvement Housing Physical Health Resilience  ADL's:  Intact  Cognition:  WNL  Sleep:  Number of Hours: 6.75   Assessment:  Patient is a 53 year old male.  [employer].  Denies combat experiences.  He reports prior history of chronic mental illness and stresses chronic anxiety is major issue.  As per chart he has been diagnosed with depression, anxiety, schizophrenia in the past.  He attempted suicide on February 19 by overdosing.  Attempt was severe and required ICU setting treatment due to multiorgan compromise.   Patient continues to show improvement by being seen more visibly on unit  and interacting with peers.  Going to group session and participating.  Improvement in eye contact; Improvement in grooming appeared neat today.  Rates anxiety and depression 3/10 (0/nine and 10/worse).  Tolerating medications; eating and sleeping improved.    Treatment Plan Summary: Treatment plan reviewed as below today 3/3 Encourage group participation to work on coping skills and symptom reduction Continue Zyprexa 20 mg nightly for mood disorder/anxiety Continue Trileptal 150 mg every morning and 400 mg nightly for mood disorder Continue Effexor XR 225 mg daily for mood disorder/anxiety Continue Keppra 500 mg twice daily for seizure disorder  Continue Coreg 25 mg twice daily and increase  Norvasc to 10  mg daily for hypertension Continue Vistaril 25 mg every 6 hours PRN for anxiety as needed Recheck BMP in AM , to monitor Na+ as on Oxcarbamazepine. Check HgbA1C, Lipid Panel as on Olanzapine.  No changes in current treatment plan at this time.    Shuvon Rankin, NP 04/17/2018, 10:18 AM  Agree with NP progress note

## 2018-04-18 NOTE — Progress Notes (Signed)
D. Pt presents with a flat affect/anxious and depressed mood- Per pt's self inventory, pt rates his depression, hopelessness and anxiety a 2/4/2, respectively. Pt writes that his most important goal today is "improving" and  to "try and stay calm, in control of myself and think".  Pt currently denies SI/HI and AV hallucinations A. Labs and vitals monitored. Pt compliant with medications. Pt supported emotionally and encouraged to express concerns and ask questions.   R. Pt remains safe with 15 minute checks. Will continue POC.

## 2018-04-18 NOTE — [facility] Group Notes (Signed)
[facility] Group Notes:  (Nursing)  Date:  04/18/2018  Time: 1:15 PM Type of Therapy:  Nurse Education  Participation Level:  Active  Participation Quality:  Appropriate  Affect:  Appropriate  Cognitive:  Appropriate  Insight:  Appropriate  Engagement in Group:  Engaged  Modes of Intervention:  Education  Summary of Progress/Problems: Identifying Needs/ Life Skills Group  [name] 04/18/2018, 2:05 PM

## 2018-04-18 NOTE — Progress Notes (Signed)
Pt invited, but declined wrap-up group this evening.

## 2018-04-18 NOTE — [facility] Group Notes (Signed)
LCSW Group Therapy Note  04/18/2018    10:00-11:00am   Type of Therapy and Topic:  Group Therapy: Early Messages Received About Anger  Participation Level:  Active   Description of Group:   In this group, patients shared and discussed the early messages received in their lives about anger through parental or other adult modeling, teaching, repression, punishment, violence, and more.  Participants identified how those childhood lessons influence even now how they usually or often react when angered.  The group discussed that anger is a secondary emotion and what may be the underlying emotional themes that come out through anger outbursts or that are ignored through anger suppression.  Finally, as a group there was a conversation about the workbook's quote that "There is nothing wrong with anger; it is just a sign something needs to change."     Therapeutic Goals: 1. Patients will identify one or more childhood message about anger that they received and how it was taught to them. 2. Patients will discuss how these childhood experiences have influenced and continue to influence their own expression or repression of anger even today. 3. Patients will explore possible primary emotions that tend to fuel their secondary emotion of anger. 4. Patients will learn that anger itself is normal and cannot be eliminated, and that healthier coping skills can assist with resolving conflict rather than worsening situations.  Summary of Patient Progress:  The patient shared that his childhood lessons about anger were that anger is "not okay" and should be held inside.  As a result, even today he feels that anger is unacceptable, and should be avoided.  Therapeutic Modalities:   Cognitive Behavioral Therapy Motivation Interviewing  Lynnell Chad  .

## 2018-04-18 NOTE — BH Assessment (Signed)
Adult Psychoeducational Group Note  Date:  04/18/2018 Time:  9:10 AM  Group Topic/Focus:  Developing a Wellness Toolbox:   The focus of this group is to help patients develop a "wellness toolbox" with skills and strategies to promote recovery upon discharge.  Participation Level:  Active  Participation Quality:  Appropriate  Affect:  Appropriate  Cognitive:  Appropriate  Insight: Appropriate and Good  Engagement in Group:  Engaged  Modes of Intervention:  Discussion  Additional Comments:  Pt attended and participated the morning wellness group.   David Dennis 04/18/2018, 9:10 AM

## 2018-04-18 NOTE — Progress Notes (Addendum)
David Dennis did not attend wrap-up group tonight provided with encoragement. Pt appears flat/depressed/anxious/isolative in affect and mood. Pt denies SI/HI/AVH/Pain at this time. Pt c/o of ongoing anxiety. Pt notice to pace the hallway a lot. Pt is guarded/minimal with interaction. Pt preoccupied with taking vistaril. Will continue with POC.

## 2018-04-18 NOTE — Progress Notes (Signed)
[facility] MD Progress Note  04/18/2018 12:31 PM David Dennis  MRN:  [id number] Subjective: Patient is a 53 year old male with a past psychiatric history significant for schizophrenia and depression.  He was originally admitted after an intentional multidrug overdose in a suicide attempt.  Objective: Patient is seen and examined.  Patient is a 53 year old male with the above-stated past psychiatric history who is seen in follow-up.  The patient is familiar to me from me being on service last week.  He continues to slowly improve.  His eye contact is better.  His speech is clear.  He is not having any psychotic symptoms.  His paranoia has decreased.  He stated he has not had any suicidal thoughts in the last day or so.  He denied any side effects to his current medications.  His sleep is good.  He continues on Zyprexa 20 mg p.o. nightly, Trileptal 4 and 50 mg p.o. nightly and 150 mg p.o. daily as well as Effexor XR 225 mg p.o. daily.  His blood pressure is 125/94, pulse is 79.  He slept 6.25 hours last night.  He had suffered hepatic failure as well as renal failure after his overdose.  His most recent creatinine is in the normal range at 0.87 on 3/6.  His liver function enzymes have decreased as well.  His AST was 33 and ALT 308 on 2/29.  Principal Problem: Paranoid schizophrenia (HCC) Diagnosis: Principal Problem:   Paranoid schizophrenia (HCC) Active Problems:   Overdose   Social anxiety disorder   MDD (major depressive disorder), severe (HCC)   AKI (acute kidney injury) (HCC)  Total Time spent with patient: 15 minutes  Past Psychiatric History: See admission H&P  Past Medical History:  Past Medical History:  Diagnosis Date  . Depression   . Generalized anxiety disorder   . Hypertension    History reviewed. No pertinent surgical history. Family History: History reviewed. No pertinent family history. Family Psychiatric  History: See admission H&P Social History:  Social History    Substance and Sexual Activity  Alcohol Use Yes  . Alcohol/week: 84.0 standard drinks  . Types: 84 Cans of beer per week   Comment: "about a 12 pack of beer everyday"     Social History   Substance and Sexual Activity  Drug Use No    Social History   Socioeconomic History  . Marital status: Single    Spouse name: Not on file  . Number of children: Not on file  . Years of education: Not on file  . Highest education level: Not on file  Occupational History  . Not on file  Social Needs  . Financial resource strain: Not on file  . Food insecurity:    Worry: Not on file    Inability: Not on file  . Transportation needs:    Medical: Not on file    Non-medical: Not on file  Tobacco Use  . Smoking status: Former Smoker    Last attempt to quit: 04/01/2018    Years since quitting: 0.0  . Smokeless tobacco: Never Used  Substance and Sexual Activity  . Alcohol use: Yes    Alcohol/week: 84.0 standard drinks    Types: 84 Cans of beer per week    Comment: "about a 12 pack of beer everyday"  . Drug use: No  . Sexual activity: Not on file  Lifestyle  . Physical activity:    Days per week: Not on file    Minutes per session: Not  on file  . Stress: Not on file  Relationships  . Social connections:    Talks on phone: Not on file    Gets together: Not on file    Attends religious service: Not on file    Active member of club or organization: Not on file    Attends meetings of clubs or organizations: Not on file    Relationship status: Not on file  Other Topics Concern  . Not on file  Social History Narrative  . Not on file   Additional Social History:                         Sleep: Good  Appetite:  Fair  Current Medications: Current Facility-Administered Medications  Medication Dose Route Frequency Provider Last Rate Last Dose  . acetaminophen (TYLENOL) tablet 650 mg  650 mg Oral Q6H PRN Antonieta Pertlary, Greg Lawson, MD      . amLODipine (NORVASC) tablet 10 mg  10  mg Oral Daily Cobos, Rockey SituFernando A, MD   10 mg at 04/18/18 0802  . antiseptic oral rinse (BIOTENE) solution 15 mL  15 mL Mouth Rinse PRN Antonieta Pertlary, Greg Lawson, MD   15 mL at 04/16/18 1138  . carvedilol (COREG) tablet 25 mg  25 mg Oral BID WC Antonieta Pertlary, Greg Lawson, MD   25 mg at 04/18/18 0803  . hydrOXYzine (ATARAX/VISTARIL) tablet 25 mg  25 mg Oral Q6H PRN Kerry HoughSimon, Spencer E, PA-C   25 mg at 04/18/18 16100611  . levETIRAcetam (KEPPRA) tablet 500 mg  500 mg Oral BID Antonieta Pertlary, Greg Lawson, MD   500 mg at 04/18/18 0804  . magnesium hydroxide (MILK OF MAGNESIA) suspension 5 mL  5 mL Oral QHS PRN Antonieta Pertlary, Greg Lawson, MD      . OLANZapine [facility](ZYPREXA) tablet 20 mg  20 mg Oral QHS Antonieta Pertlary, Greg Lawson, MD   20 mg at 04/17/18 2102  . OXcarbazepine (TRILEPTAL) tablet 150 mg  150 mg Oral Daily Antonieta Pertlary, Greg Lawson, MD   150 mg at 04/18/18 0804  . OXcarbazepine (TRILEPTAL) tablet 450 mg  450 mg Oral QHS Antonieta Pertlary, Greg Lawson, MD   450 mg at 04/17/18 2103  . venlafaxine XR (EFFEXOR-XR) 24 hr capsule 225 mg  225 mg Oral Q breakfast Antonieta Pertlary, Greg Lawson, MD   225 mg at 04/18/18 [id number]    Lab Results:  Results for orders placed or performed during the hospital encounter of 04/08/18 (from the past 48 hour(s))  Basic metabolic panel     Status: None   Collection Time: 04/17/18  6:36 AM  Result Value Ref Range   Sodium 142 135 - 145 mmol/L   Potassium 3.7 3.5 - 5.1 mmol/L   Chloride 104 98 - 111 mmol/L   CO2 30 22 - 32 mmol/L   Glucose, Bld 86 70 - 99 mg/dL   BUN 18 6 - 20 mg/dL   Creatinine, Ser 5.400.87 0.61 - 1.24 mg/dL   Calcium 9.3 8.9 - 98.110.3 mg/dL   GFR calc non Af Amer >60 >60 mL/min   GFR calc Af Amer >60 >60 mL/min   Anion gap 8 5 - 15    Comment: Performed at [facility], [address] [place], [place] [address]  Hemoglobin A1c     Status: None   Collection Time: 04/17/18  6:36 AM  Result Value Ref Range   Hgb A1c MFr Bld 5.5 4.8 - 5.6 %    Comment: (NOTE) Pre diabetes:  5.7%-6.4% Diabetes:               >6.4% Glycemic control for   <7.0% adults with diabetes    Mean Plasma Glucose 111.15 mg/dL    Comment: Performed at [facility], [address] [place], [place] [address]  Lipid panel     Status: Abnormal   Collection Time: 04/17/18  6:36 AM  Result Value Ref Range   Cholesterol 182 0 - 200 mg/dL   Triglycerides 51 <017 mg/dL   HDL 42 >49 mg/dL   Total CHOL/HDL Ratio 4.3 RATIO   VLDL 10 0 - 40 mg/dL   LDL Cholesterol 449 (H) 0 - 99 mg/dL    Comment:        Total Cholesterol/HDL:CHD Risk Coronary Heart Disease Risk Table                     Men   Women  1/2 Average Risk   3.4   3.3  Average Risk       5.0   4.4  2 X Average Risk   9.6   7.1  3 X Average Risk  23.4   11.0        Use the calculated Patient Ratio above and the CHD Risk Table to determine the patient's CHD Risk.        ATP Dennis CLASSIFICATION (LDL):  <100     mg/dL   Optimal  675-916  mg/dL   Near or Above                    Optimal  130-159  mg/dL   Borderline  384-665  mg/dL   High  >993     mg/dL   Very High Performed at [facility], [address] [place], [place] [address]     Blood Alcohol level:  Lab Results  Component Value Date   ETH <10 04/01/2018   ETH <5 08/10/2015    Metabolic Disorder Labs: Lab Results  Component Value Date   HGBA1C 5.5 04/17/2018   MPG 111.15 04/17/2018   No results found for: PROLACTIN Lab Results  Component Value Date   CHOL 182 04/17/2018   TRIG 51 04/17/2018   HDL 42 04/17/2018   CHOLHDL 4.3 04/17/2018   VLDL 10 04/17/2018   LDLCALC 130 (H) 04/17/2018    Physical Findings: AIMS: Facial and Oral Movements Muscles of Facial Expression: None, normal Lips and Perioral Area: None, normal Jaw: None, normal Tongue: None, normal,Extremity Movements Upper (arms, wrists, hands, fingers): None, normal Lower (legs, knees, ankles, toes): None, normal, Trunk Movements Neck, shoulders, hips: None, normal, Overall  Severity Severity of abnormal movements (highest score from questions above): None, normal Incapacitation due to abnormal movements: None, normal Patient's awareness of abnormal movements (rate only patient's report): No Awareness, Dental Status Current problems with teeth and/or dentures?: No Does patient usually wear dentures?: No  CIWA:  CIWA-Ar Total: 1 COWS:  COWS Total Score: 2  Musculoskeletal: Strength & Muscle Tone: within normal limits Gait & Station: normal Patient leans: N/A  Psychiatric Specialty Exam: Physical Exam  Nursing note and vitals reviewed. Constitutional: He is oriented to person, place, and time. He appears well-developed and well-nourished.  HENT:  Head: Normocephalic and atraumatic.  Respiratory: Effort normal.  Neurological: He is alert and oriented to person, place, and time.    ROS  Blood pressure (!) 125/94, pulse 79, temperature 98.1 F (36.7 C), temperature source Oral,  resp. rate 18, height 6' (1.829 m), weight 68 kg, SpO2 100 %.Body mass index is 20.34 kg/m.  General Appearance: Casual  Eye Contact:  Fair  Speech:  Normal Rate  Volume:  Normal  Mood:  Anxious  Affect:  Congruent  Thought Process:  Coherent and Descriptions of Associations: Intact  Orientation:  Full (Time, Place, and Person)  Thought Content:  Logical  Suicidal Thoughts:  No  Homicidal Thoughts:  No  Memory:  Immediate;   Fair Recent;   Fair Remote;   Fair  Judgement:  Intact  Insight:  Fair  Psychomotor Activity:  Normal  Concentration:  Concentration: Fair and Attention Span: Fair  Recall:  [employer] of Knowledge:  Fair  Language:  Fair  Akathisia:  Negative  Handed:  Right  AIMS (if indicated):     Assets:  Desire for Improvement Financial Resources/Insurance Housing Leisure Time Resilience Social Support  ADL's:  Intact  Cognition:  WNL  Sleep:  Number of Hours: 6.5     Treatment Plan Summary: Daily contact with patient to assess and evaluate  symptoms and progress in treatment, Medication management and Plan : Patient is seen and examined.  Patient's 53 year old male with the above-stated past psychiatric history is seen in follow-up.  Patient continues to slowly improve.  His mood and affect are improved.  He is not having any psychotic symptoms.  The plan is to be able to prepare his mother and father for him to return home sometime early this week.  No change in his current medications.  I will repeat his liver panel to make sure that his AST is coming down as well. 1.  Continue amlodipine 10 mg p.o. daily for hypertension and heart health. 2.  Continue Biotene mouth rinse as needed for dry mouth. 3.  Continue Coreg 25 mg p.o. twice daily with meals for hypertension and heart health. 4.  Continue hydroxyzine 25 mg every 6 hours as needed anxiety. 5.  Continue Keppra 500 mg p.o. twice daily for seizures. 6.  Continue Zyprexa 20 mg p.o. daily for psychosis and insomnia. 7.  Continue Trileptal 150 mg p.o. daily and 450 mg p.o. nightly for mood stability and insomnia. 8.  Continue Effexor extended release 225 mg p.o. daily. 9.  Disposition planning-in progress. Antonieta Pert, MD 04/18/2018, 12:31 PM

## 2018-04-19 MED ORDER — OXCARBAZEPINE 300 MG PO TABS
600.0000 mg | ORAL_TABLET | Freq: Every day | ORAL | Status: DC
  Administered 2018-04-19 – 2018-04-20 (×2): 600 mg via ORAL
  Filled 2018-04-19 (×4): qty 2
  Filled 2018-04-19: qty 14

## 2018-04-19 MED ORDER — BUSPIRONE HCL 10 MG PO TABS
10.0000 mg | ORAL_TABLET | Freq: Two times a day (BID) | ORAL | Status: DC
  Administered 2018-04-19 – 2018-04-21 (×4): 10 mg via ORAL
  Filled 2018-04-19 (×2): qty 1
  Filled 2018-04-19: qty 14
  Filled 2018-04-19 (×2): qty 1
  Filled 2018-04-19: qty 2
  Filled 2018-04-19 (×3): qty 1
  Filled 2018-04-19: qty 14
  Filled 2018-04-19: qty 1

## 2018-04-19 NOTE — Progress Notes (Signed)
[facility] MD Progress Note  04/19/2018 11:31 AM David Dennis  MRN:  [id number] Subjective:  Patient is a 53 year old male with a past psychiatric history significant for schizophrenia and depression.  He was originally admitted after an intentional multidrug overdose in a suicide attempt.  Objective: Patient is seen and examined.  Patient is a 53 year old male with the above-stated past psychiatric history who is seen in follow-up.  He continues to slowly improve.  Eye contact is somewhat better, speech is clear.  Nursing notes reflect that he rated his depression, hopelessness and anxiety as a 2/3/1 respectively.  He is attending groups and working on things, but feels significantly anxious during these groups.  His blood pressures a little elevated this morning at 130/101.  His pulse is 83.  He slept 5.5 hours last night.  He denied suicidal ideation.  He denied auditory or visual hallucinations.  Principal Problem: Paranoid schizophrenia (HCC) Diagnosis: Principal Problem:   Paranoid schizophrenia (HCC) Active Problems:   Overdose   Social anxiety disorder   MDD (major depressive disorder), severe (HCC)   AKI (acute kidney injury) (HCC)  Total Time spent with patient: 15 minutes  Past Psychiatric History: See admission H&P  Past Medical History:  Past Medical History:  Diagnosis Date  . Depression   . Generalized anxiety disorder   . Hypertension    History reviewed. No pertinent surgical history. Family History: History reviewed. No pertinent family history. Family Psychiatric  History: See admission H&P Social History:  Social History   Substance and Sexual Activity  Alcohol Use Yes  . Alcohol/week: 84.0 standard drinks  . Types: 84 Cans of beer per week   Comment: "about a 12 pack of beer everyday"     Social History   Substance and Sexual Activity  Drug Use No    Social History   Socioeconomic History  . Marital status: Single    Spouse name: Not on file  .  Number of children: Not on file  . Years of education: Not on file  . Highest education level: Not on file  Occupational History  . Not on file  Social Needs  . Financial resource strain: Not on file  . Food insecurity:    Worry: Not on file    Inability: Not on file  . Transportation needs:    Medical: Not on file    Non-medical: Not on file  Tobacco Use  . Smoking status: Former Smoker    Last attempt to quit: 04/01/2018    Years since quitting: 0.0  . Smokeless tobacco: Never Used  Substance and Sexual Activity  . Alcohol use: Yes    Alcohol/week: 84.0 standard drinks    Types: 84 Cans of beer per week    Comment: "about a 12 pack of beer everyday"  . Drug use: No  . Sexual activity: Not on file  Lifestyle  . Physical activity:    Days per week: Not on file    Minutes per session: Not on file  . Stress: Not on file  Relationships  . Social connections:    Talks on phone: Not on file    Gets together: Not on file    Attends religious service: Not on file    Active member of club or organization: Not on file    Attends meetings of clubs or organizations: Not on file    Relationship status: Not on file  Other Topics Concern  . Not on file  Social History Narrative  .  Not on file   Additional Social History:                         Sleep: Fair  Appetite:  Fair  Current Medications: Current Facility-Administered Medications  Medication Dose Route Frequency Provider Last Rate Last Dose  . acetaminophen (TYLENOL) tablet 650 mg  650 mg Oral Q6H PRN Antonieta Pert, MD      . amLODipine (NORVASC) tablet 10 mg  10 mg Oral Daily Cobos, Rockey Situ, MD   10 mg at 04/19/18 0805  . antiseptic oral rinse (BIOTENE) solution 15 mL  15 mL Mouth Rinse PRN Antonieta Pert, MD   15 mL at 04/16/18 1138  . carvedilol (COREG) tablet 25 mg  25 mg Oral BID WC Antonieta Pert, MD   25 mg at 04/19/18 0805  . hydrOXYzine (ATARAX/VISTARIL) tablet 25 mg  25 mg Oral Q6H  PRN Kerry Hough, PA-C   25 mg at 04/19/18 3244  . levETIRAcetam (KEPPRA) tablet 500 mg  500 mg Oral BID Antonieta Pert, MD   500 mg at 04/19/18 0804  . magnesium hydroxide (MILK OF MAGNESIA) suspension 5 mL  5 mL Oral QHS PRN Antonieta Pert, MD      . OLANZapine [facility]) tablet 20 mg  20 mg Oral QHS Antonieta Pert, MD   20 mg at 04/18/18 2038  . OXcarbazepine (TRILEPTAL) tablet 150 mg  150 mg Oral Daily Antonieta Pert, MD   150 mg at 04/19/18 0804  . OXcarbazepine (TRILEPTAL) tablet 450 mg  450 mg Oral QHS Antonieta Pert, MD   450 mg at 04/18/18 2038  . venlafaxine XR (EFFEXOR-XR) 24 hr capsule 225 mg  225 mg Oral Q breakfast Antonieta Pert, MD   225 mg at 04/19/18 0102    Lab Results: No results found for this or any previous visit (from the past 48 hour(s)).  Blood Alcohol level:  Lab Results  Component Value Date   ETH <10 04/01/2018   ETH <5 08/10/2015    Metabolic Disorder Labs: Lab Results  Component Value Date   HGBA1C 5.5 04/17/2018   MPG 111.15 04/17/2018   No results found for: PROLACTIN Lab Results  Component Value Date   CHOL 182 04/17/2018   TRIG 51 04/17/2018   HDL 42 04/17/2018   CHOLHDL 4.3 04/17/2018   VLDL 10 04/17/2018   LDLCALC 130 (H) 04/17/2018    Physical Findings: AIMS: Facial and Oral Movements Muscles of Facial Expression: None, normal Lips and Perioral Area: None, normal Jaw: None, normal Tongue: None, normal,Extremity Movements Upper (arms, wrists, hands, fingers): None, normal Lower (legs, knees, ankles, toes): None, normal, Trunk Movements Neck, shoulders, hips: None, normal, Overall Severity Severity of abnormal movements (highest score from questions above): None, normal Incapacitation due to abnormal movements: None, normal Patient's awareness of abnormal movements (rate only patient's report): No Awareness, Dental Status Current problems with teeth and/or dentures?: No Does patient usually wear dentures?:  No  CIWA:  CIWA-Ar Total: 1 COWS:  COWS Total Score: 2  Musculoskeletal: Strength & Muscle Tone: within normal limits Gait & Station: normal Patient leans: N/A  Psychiatric Specialty Exam: Physical Exam  Nursing note and vitals reviewed. Constitutional: He is oriented to person, place, and time. He appears well-developed and well-nourished.  HENT:  Head: Normocephalic and atraumatic.  Respiratory: Effort normal.  Neurological: He is alert and oriented to person, place, and time.    ROS  Blood pressure (!) 130/101, pulse 83, temperature 98.1 F (36.7 C), temperature source Oral, resp. rate 18, height 6' (1.829 m), weight 68 kg, SpO2 100 %.Body mass index is 20.34 kg/m.  General Appearance: Casual  Eye Contact:  Minimal  Speech:  Normal Rate  Volume:  Normal  Mood:  Anxious  Affect:  Congruent  Thought Process:  Coherent and Descriptions of Associations: Intact  Orientation:  Full (Time, Place, and Person)  Thought Content:  Logical  Suicidal Thoughts:  No  Homicidal Thoughts:  No  Memory:  Immediate;   Fair Recent;   Fair Remote;   Fair  Judgement:  Intact  Insight:  Fair  Psychomotor Activity:  Normal  Concentration:  Concentration: Fair and Attention Span: Fair  Recall:  [employer]  Fund of Knowledge:  Fair  Language:  Fair  Akathisia:  Negative  Handed:  Right  AIMS (if indicated):     Assets:  Desire for Improvement Financial Resources/Insurance Housing Resilience Social Support  ADL's:  Intact  Cognition:  WNL  Sleep:  Number of Hours: 5.5     Treatment Plan Summary: Daily contact with patient to assess and evaluate symptoms and progress in treatment, Medication management and Plan : Patient is seen and examined.  Patient is a 53 year old male with the above-stated past psychiatric history who is seen in follow-up.  His suicidal ideation continues to be controlled as well as his paranoid thoughts.  He continues to have problems with social anxiety.  His sleep  was a little bit worse last night, and he does complain of some mild sedation during the day.  His blood pressure is mildly elevated today, and he continues on Coreg and amlodipine.  His pressures appear to be increased in the morning when he goes to group, but other readings seem to be stable at that point.  I am going to increase his bedtime Trileptal to 600 mg, and we will continue the 150 mg p.o. daily.  No other changes in his medications at this time.  His suicidal ideation is gone, and his mood and anxiety are better.  Hopefully this will help his sleep and anxiety, and we can attempt to get him home early this week. 1.  Continue amlodipine 10 mg p.o. daily for hypertension. 2.  Continue Biotene mouthwash as needed for dry mouth. 3.  Continue Coreg 25 mg p.o. twice daily for hypertension and heart health. 4.  Continue hydroxyzine 25 mg p.o. every 6 hours as needed anxiety. 5.  Continue Keppra 500 mg p.o. twice daily for seizure disorder. 6.  Continue Zyprexa 20 mg p.o. nightly for psychosis and sleep. 7.  Continue Trileptal 150 mg p.o. daily, but increase bedtime Trileptal to 600 mg p.o. nightly for anxiety, mood stability and sleep. 8.  Continue Effexor XR 225 mg p.o. daily for anxiety and mood. 9.  BuSpar 5 mg p.o. twice daily for anxiety. 10.  Disposition planning-in progress.  Antonieta PertGreg Lawson Kerith Sherley, MD 04/19/2018, 11:31 AM

## 2018-04-19 NOTE — [facility] Group Notes (Signed)
[facility] Group Notes:  (Nursing)  Date:  04/19/2018  Time: 130 PM Type of Therapy:  Nurse Education  Participation Level:  Active  Participation Quality:  Appropriate  Affect:  Appropriate  Cognitive:  Appropriate  Insight:  Appropriate  Engagement in Group:  Engaged  Modes of Intervention:  Discussion  Summary of Progress/Problems: Group played a non competitive learning/communication board game that fosters listening skills as well as self expression.  [name] 04/19/2018, 2:46 PM

## 2018-04-19 NOTE — Progress Notes (Signed)
D  Pt tends to keep to himself    He does come for medications and had no other complaints   He is guarded and isolative    A    Verbal support given   Medications administered and effectiveness monitored    Q 15 min checks R   Pt remains safe

## 2018-04-19 NOTE — Progress Notes (Signed)
D. Pt is friendly upon approach- continues to be somewhat isolative- reports some improvement in mood since admission- less anxious Per pt's self inventory, pt rates his depression, hopelessness and anxiety a 2/3/1, respectively.Pt writes that his most important goal today is "coping, relaxing" and writes that he will "think, study" to help him meet that goal. . Pt currently denies SI/HI and AV hallucinations. A. Labs and vitals monitored. Pt compliant with medications. Pt supported emotionally and encouraged to express concerns and ask questions.   R. Pt remains safe with 15 minute checks. Will continue POC.

## 2018-04-19 NOTE — [facility] Group Notes (Signed)
[facility] LCSW Group Therapy Note  04/19/2018   10:00-11:00AM  Type of Therapy and Topic:  Group Therapy:  Unhealthy versus Healthy Supports, Which Am I?  Participation Level:  Active   Description of Group:  Patients in this group were introduced to the concept that additional supports including self-support are an essential part of recovery.  Initially a discussion was held about the differences between healthy versus unhealthy supports.  Patients were asked to share what unhealthy supports in their lives need to be addressed, as well as what additional healthy supports could be added for greater help in reaching their goals.   A song entitled "My Own Hero" was played and a group discussion ensued in which patients stated they could relate to the song and it inspired them to realize they have be willing to help themselves in order to succeed, because other people cannot achieve sobriety or stability for them.  We discussed adding a variety of healthy supports to address the various needs in patient lives, including becoming more self-supportive.  Therapeutic Goals: 1)  Highlight the differences between healthy and unhealthy supports 2)  Suggest the importance of being a part of one's own support system 2)  Discuss reasons people in one's life may eventually be unable to be continually supportive  3)  Identify the patient's current support system and   4)  elicit commitments to add healthy supports and to become more conscious of being self-supportive   Summary of Patient Progress:  The patient expressed that the unhealthy support which needs to be addressed includes himself, which he thinks he can do by learning more coping skills.  Healthy supports which could be added for increased stability and happiness include a therapist to help him work on those coping skills.  He stated he has good support from his family, has social supports and medicines/a doctor.  Therapeutic Modalities:   Motivational  Interviewing Activity  Lynnell Chad

## 2018-04-19 NOTE — Progress Notes (Signed)
Adult Psychoeducational Group Note  Date:  04/19/2018 Time:  8:54 PM  Group Topic/Focus:  Wrap-Up Group:   The focus of this group is to help patients review their daily goal of treatment and discuss progress on daily workbooks.  Participation Level:  Did Not Attend  Participation Quality:  Did not attend  Affect:  Did not attend  Cognitive:  Did not attend  Insight: None  Engagement in Group:  Did not attend  Modes of Intervention:  Did not attend  Additional Comments:  Patient did not attend wrap up group this evening.  Ein Rijo L Zelda Reames 04/19/2018, 8:54 PM

## 2018-04-20 NOTE — Tx Team (Signed)
Interdisciplinary Treatment and Diagnostic Plan Update  04/20/2018 Time of Session: 10:00am David Dennis MRN: [id number]  Principal Diagnosis: Paranoid schizophrenia [facility])  Secondary Diagnoses: Principal Problem:   Paranoid schizophrenia (HCC) Active Problems:   Overdose   Social anxiety disorder   AKI (acute kidney injury) (HCC)   MDD (major depressive disorder), severe (HCC)   Current Medications:  Current Facility-Administered Medications  Medication Dose Route Frequency Provider Last Rate Last Dose  . acetaminophen (TYLENOL) tablet 650 mg  650 mg Oral Q6H PRN Antonieta Pert, MD      . amLODipine (NORVASC) tablet 10 mg  10 mg Oral Daily Cobos, Rockey Situ, MD   10 mg at 04/20/18 0811  . antiseptic oral rinse (BIOTENE) solution 15 mL  15 mL Mouth Rinse PRN Antonieta Pert, MD   15 mL at 04/16/18 1138  . busPIRone (BUSPAR) tablet 10 mg  10 mg Oral BID Antonieta Pert, MD   10 mg at 04/20/18 0810  . carvedilol (COREG) tablet 25 mg  25 mg Oral BID WC Antonieta Pert, MD   25 mg at 04/20/18 0810  . hydrOXYzine (ATARAX/VISTARIL) tablet 25 mg  25 mg Oral Q6H PRN Kerry Hough, PA-C   25 mg at 04/20/18 1433  . levETIRAcetam (KEPPRA) tablet 500 mg  500 mg Oral BID Antonieta Pert, MD   500 mg at 04/20/18 0810  . magnesium hydroxide (MILK OF MAGNESIA) suspension 5 mL  5 mL Oral QHS PRN Antonieta Pert, MD      . OLANZapine [facility]) tablet 20 mg  20 mg Oral QHS Antonieta Pert, MD   20 mg at 04/19/18 2115  . OXcarbazepine (TRILEPTAL) tablet 150 mg  150 mg Oral Daily Antonieta Pert, MD   150 mg at 04/20/18 0810  . Oxcarbazepine (TRILEPTAL) tablet 600 mg  600 mg Oral QHS Antonieta Pert, MD   600 mg at 04/19/18 2115  . venlafaxine XR (EFFEXOR-XR) 24 hr capsule 225 mg  225 mg Oral Q breakfast Antonieta Pert, MD   225 mg at 04/20/18 4098   PTA Medications: Medications Prior to Admission  Medication Sig Dispense Refill Last Dose  . carvedilol (COREG)  6.25 MG tablet Take 1 tablet (6.25 mg total) by mouth 2 (two) times daily with a meal.     . feeding supplement, ENSURE ENLIVE, (ENSURE ENLIVE) LIQD Take 237 mLs by mouth 2 (two) times daily between meals. 237 mL 12   . folic acid (FOLVITE) 1 MG tablet Take 1 tablet (1 mg total) by mouth at bedtime.     . gabapentin (NEURONTIN) 100 MG capsule Take 1 capsule (100 mg total) by mouth 2 (two) times daily.     Marland Kitchen levETIRAcetam (KEPPRA) 500 MG tablet Take 1 tablet (500 mg total) by mouth 2 (two) times daily. 60 tablet 0   . Multiple Vitamins-Minerals (MULTIVITAL PO) Take 1 tablet by mouth daily.   november  . QUEtiapine (SEROQUEL) 25 MG tablet Take 1 tablet (25 mg total) by mouth daily as needed (at bedtime for sleep).     . thiamine 100 MG tablet Take 1 tablet (100 mg total) by mouth at bedtime.       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   Physician Treatment Plan for Primary Diagnosis: Paranoid  schizophrenia (HCC) Long Term Goal(s): Improvement in symptoms so as ready for discharge Improvement in symptoms so as ready for discharge   Short Term Goals: Ability to identify changes in lifestyle to reduce recurrence of condition will improve Ability to verbalize feelings will improve Ability to disclose and discuss suicidal ideas Ability to demonstrate self-control will improve Ability to identify and develop effective coping behaviors will improve Ability to maintain clinical measurements within normal limits will improve Compliance with prescribed medications will improve Ability to identify changes in lifestyle to reduce recurrence of condition will improve Ability to verbalize feelings will improve Ability to  disclose and discuss suicidal ideas Ability to demonstrate self-control will improve Ability to identify and develop effective coping behaviors will improve Ability to maintain clinical measurements within normal limits will improve Compliance with prescribed medications will improve  Medication Management: Evaluate patient's response, side effects, and tolerance of medication regimen.  Therapeutic Interventions: 1 to 1 sessions, Unit Group sessions and Medication administration.  Evaluation of Outcomes: Progressing  Physician Treatment Plan for Secondary Diagnosis: Principal Problem:   Paranoid schizophrenia (HCC) Active Problems:   Overdose   Social anxiety disorder   AKI (acute kidney injury) (HCC)   MDD (major depressive disorder), severe (HCC)  Long Term Goal(s): Improvement in symptoms so as ready for discharge Improvement in symptoms so as ready for discharge   Short Term Goals: Ability to identify changes in lifestyle to reduce recurrence of condition will improve Ability to verbalize feelings will improve Ability to disclose and discuss suicidal ideas Ability to demonstrate self-control will improve Ability to identify and develop effective coping behaviors will improve Ability to maintain clinical measurements within normal limits will improve Compliance with prescribed medications will improve Ability to identify changes in lifestyle to reduce recurrence of condition will improve Ability to verbalize feelings will improve Ability to disclose and discuss suicidal ideas Ability to demonstrate self-control will improve Ability to identify and develop effective coping behaviors will improve Ability to maintain clinical measurements within normal limits will improve Compliance with prescribed medications will improve     Medication Management: Evaluate patient's response, side effects, and tolerance of medication regimen.  Therapeutic Interventions: 1 to 1 sessions, Unit  Group sessions and Medication administration.  Evaluation of Outcomes: Progressing   RN Treatment Plan for Primary Diagnosis: Paranoid schizophrenia (HCC) Long Term Goal(s): Knowledge of disease and therapeutic regimen to maintain health will improve  Short Term Goals: Ability to participate in decision making will improve, Ability to verbalize feelings will improve, Ability to disclose and discuss suicidal ideas, Ability to identify and develop effective coping behaviors will improve and Compliance with prescribed medications will improve  Medication Management: RN will administer medications as ordered by provider, will assess and evaluate patient's response and provide education to patient for prescribed medication. RN will report any adverse and/or side effects to prescribing provider.  Therapeutic Interventions: 1 on 1 counseling sessions, Psychoeducation, Medication administration, Evaluate responses to treatment, Monitor vital signs and CBGs as ordered, Perform/monitor CIWA, COWS, AIMS and Fall Risk screenings as ordered, Perform wound care treatments as ordered.  Evaluation of Outcomes: Progressing   LCSW Treatment Plan for Primary Diagnosis: Paranoid schizophrenia (HCC) Long Term Goal(s): Safe transition to appropriate next level of care at discharge, Engage patient in therapeutic group addressing interpersonal concerns.  Short Term Goals: Engage patient in aftercare planning with referrals and resources  Therapeutic Interventions: Assess for all discharge needs, 1 to 1 time with Social worker, Explore available resources and support  systems, Assess for adequacy in community support network, Educate family and significant other(s) on suicide prevention, Complete Psychosocial Assessment, Interpersonal group therapy.  Evaluation of Outcomes: Progressing   Progress in Treatment: Attending groups: Yes. Participating in groups: Yes. Taking medication as prescribed: Yes. Toleration  medication: Yes. Family/Significant other contact made: Yes, individual(s) contacted:  the patient's mother Patient understands diagnosis: Yes. Discussing patient identified problems/goals with staff: Yes. Medical problems stabilized or resolved: Yes. Denies suicidal/homicidal ideation: Yes. Issues/concerns per patient self-inventory: No. Other:   New problem(s) identified: None   New Short Term/Long Term Goal(s): medication stabilization, elimination of SI thoughts, development of comprehensive mental wellness plan.   Patient Goals:  Help with depression and anxiety   Discharge Plan or Barriers: Patient lives at home with his mother in [place], [place]. He reports he follows up with a psychiatrist at the [facility] once, every three months. Patient is considering following up with the [facility] for outpatient medication management and therapy services at discharge. CSW will continue to follow and assess for appropriate referrals and discharge planning.   Reason for Continuation of Hospitalization: Anxiety Depression Medication stabilization Suicidal ideation  Estimated Length of Stay: 3-5 days  Attendees: Patient: 04/20/2018 2:41 PM  Physician: Dr. Landry Mellow, MD; Dr. Nehemiah Massed, MD 04/20/2018 2:41 PM  Nursing: Lanora Manis.Archie Balboa.Kathie Rhodes, RN 04/20/2018 2:41 PM  RN Care Manager: 04/20/2018 2:41 PM  Social Worker: Baldo Daub, LCSWA [place], [place] 04/20/2018 2:41 PM  Recreational Therapist:  04/20/2018 2:41 PM  Other: Marciano Sequin, NP  04/20/2018 2:41 PM  Other:  04/20/2018 2:41 PM  Other: 04/20/2018 2:41 PM    Scribe for Treatment Team: Darreld Mclean, LCSWA 04/20/2018 2:41 PM

## 2018-04-20 NOTE — Progress Notes (Addendum)
Patient rated his day a 7. Patient stated, "my day was a 5 until I sat down and ate dinner with 2 other patient's". Patient stated he felt less isolated after eating dinner. Patient's goal for today was to relax and improve his life.   Patient also stated, "I know I want a close friend or group of casual people with similar interests. However I cannot relax and enjoy myself around people.   "I did manage to sit with two people at dinner tonight. It was stressful. There was some good that came from it, but there were also distractions (painful distractions)".    I think I need someone in my life (almost certain), but it's so uncomfortable. I can not think well around people. I feel anxious (less so with anti-anxiety medicine). My instincts say stay away from others. My mind says I need to find a way around the fear of people if I want to live, and have any quality of life and peace of mind".

## 2018-04-20 NOTE — Plan of Care (Signed)
  Problem: Education: Goal: Mental status will improve Outcome: Progressing   Problem: Activity: Goal: Interest or engagement in activities will improve Outcome: Progressing   D: Pt alert and oriented on the unit. Pt engaging with RN staff and other pts. Pt denies SI/HI, A/VH. Pt's affect was flat and sad, and he paced the hallways a few times today. Pt rated his depression and anxiety a 2, and feelings of hopelessness a 3, with 10 being the worst. Pt's goal for today is "seeing the positive aspects, relax instead of stress, and think." Pt is pleasant and cooperative.  A: Education, support and encouragement provided, q15 minute safety checks remain in effect. Medications administered per MD orders.  R: No reactions/side effects to medicine noted. Pt denies any concerns at this time, and verbally contracts for safety.Pt ambulating on the unit with no issues. Pt remains safe on and off the unit.

## 2018-04-20 NOTE — Progress Notes (Addendum)
[facility] MD Progress Note  04/20/2018 11:25 AM David Dennis  MRN:  [id number] Subjective:  "I'm a little sleepy but the medicine is helping."  David Dennis found lying in bed. Presents with constricted affect and minimal eye contact. He reports his mood is improved and more stable. Denies SI. Trileptal was increased last night. He reports some sleepiness this morning but states it is helping with anxiety- "I'd rather have the side effect than anxiety." Appears alert on assessment and he did get up for breakfast and group this morning. Reports good sleep and appetite. He does not report any paranoid thoughts. He states he needs to find social outlets at discharge as his depression is largely related to isolation. States he will need to work through social anxiety to do this. He plans to continue therapy outpatient. States he believes his current medication regimen is effective. He has been going to some groups. Denies AVH.  From admission H&P: Patient is a 53 year old male with a reported past psychiatric history significant for depression, anxiety and schizophrenia who originally presented to the [facility] emergency department on 04/01/2018 after an intentional overdose. He stated he had been previously diagnosed with depression, social anxiety disorder and schizophrenia. He denied any auditory or visual hallucinations. He stated that much of what had led to the diagnosis of schizophrenia was what sounds like ideas of reference and looseness of associations. This seem to be significantly paranoid in nature. He stated on the date that he took the overdose he was tired of being alone, and was searching for a social relationship that would make him feel a value.  Principal Problem: Paranoid schizophrenia (HCC) Diagnosis: Principal Problem:   Paranoid schizophrenia (HCC) Active Problems:   Overdose   Social anxiety disorder   AKI (acute kidney injury) (HCC)   MDD (major depressive disorder), severe  (HCC)  Total Time spent with patient: 20 minutes  Past Psychiatric History: See admission H&P  Past Medical History:  Past Medical History:  Diagnosis Date  . Depression   . Generalized anxiety disorder   . Hypertension    History reviewed. No pertinent surgical history. Family History: History reviewed. No pertinent family history. Family Psychiatric  History: See admission H&P Social History:  Social History   Substance and Sexual Activity  Alcohol Use Yes  . Alcohol/week: 84.0 standard drinks  . Types: 84 Cans of beer per week   Comment: "about a 12 pack of beer everyday"     Social History   Substance and Sexual Activity  Drug Use No    Social History   Socioeconomic History  . Marital status: Single    Spouse name: Not on file  . Number of children: Not on file  . Years of education: Not on file  . Highest education level: Not on file  Occupational History  . Not on file  Social Needs  . Financial resource strain: Not on file  . Food insecurity:    Worry: Not on file    Inability: Not on file  . Transportation needs:    Medical: Not on file    Non-medical: Not on file  Tobacco Use  . Smoking status: Former Smoker    Last attempt to quit: 04/01/2018    Years since quitting: 0.0  . Smokeless tobacco: Never Used  Substance and Sexual Activity  . Alcohol use: Yes    Alcohol/week: 84.0 standard drinks    Types: 84 Cans of beer per week    Comment: "  about a 12 pack of beer everyday"  . Drug use: No  . Sexual activity: Not on file  Lifestyle  . Physical activity:    Days per week: Not on file    Minutes per session: Not on file  . Stress: Not on file  Relationships  . Social connections:    Talks on phone: Not on file    Gets together: Not on file    Attends religious service: Not on file    Active member of club or organization: Not on file    Attends meetings of clubs or organizations: Not on file    Relationship status: Not on file  Other Topics  Concern  . Not on file  Social History Narrative  . Not on file   Additional Social History:                         Sleep: Good  Appetite:  Good  Current Medications: Current Facility-Administered Medications  Medication Dose Route Frequency Provider Last Rate Last Dose  . acetaminophen (TYLENOL) tablet 650 mg  650 mg Oral Q6H PRN Antonieta Pert, MD      . amLODipine (NORVASC) tablet 10 mg  10 mg Oral Daily Allean Montfort, Rockey Situ, MD   10 mg at 04/20/18 0811  . antiseptic oral rinse (BIOTENE) solution 15 mL  15 mL Mouth Rinse PRN Antonieta Pert, MD   15 mL at 04/16/18 1138  . busPIRone (BUSPAR) tablet 10 mg  10 mg Oral BID Antonieta Pert, MD   10 mg at 04/20/18 0810  . carvedilol (COREG) tablet 25 mg  25 mg Oral BID WC Antonieta Pert, MD   25 mg at 04/20/18 0810  . hydrOXYzine (ATARAX/VISTARIL) tablet 25 mg  25 mg Oral Q6H PRN Kerry Hough, PA-C   25 mg at 04/20/18 0631  . levETIRAcetam (KEPPRA) tablet 500 mg  500 mg Oral BID Antonieta Pert, MD   500 mg at 04/20/18 0810  . magnesium hydroxide (MILK OF MAGNESIA) suspension 5 mL  5 mL Oral QHS PRN Antonieta Pert, MD      . OLANZapine [facility]) tablet 20 mg  20 mg Oral QHS Antonieta Pert, MD   20 mg at 04/19/18 2115  . OXcarbazepine (TRILEPTAL) tablet 150 mg  150 mg Oral Daily Antonieta Pert, MD   150 mg at 04/20/18 0810  . Oxcarbazepine (TRILEPTAL) tablet 600 mg  600 mg Oral QHS Antonieta Pert, MD   600 mg at 04/19/18 2115  . venlafaxine XR (EFFEXOR-XR) 24 hr capsule 225 mg  225 mg Oral Q breakfast Antonieta Pert, MD   225 mg at 04/20/18 1610    Lab Results: No results found for this or any previous visit (from the past 48 hour(s)).  Blood Alcohol level:  Lab Results  Component Value Date   ETH <10 04/01/2018   ETH <5 08/10/2015    Metabolic Disorder Labs: Lab Results  Component Value Date   HGBA1C 5.5 04/17/2018   MPG 111.15 04/17/2018   No results found for: PROLACTIN Lab  Results  Component Value Date   CHOL 182 04/17/2018   TRIG 51 04/17/2018   HDL 42 04/17/2018   CHOLHDL 4.3 04/17/2018   VLDL 10 04/17/2018   LDLCALC 130 (H) 04/17/2018    Physical Findings: AIMS: Facial and Oral Movements Muscles of Facial Expression: None, normal Lips and Perioral Area: None, normal Jaw: None, normal Tongue:  None, normal,Extremity Movements Upper (arms, wrists, hands, fingers): None, normal Lower (legs, knees, ankles, toes): None, normal, Trunk Movements Neck, shoulders, hips: None, normal, Overall Severity Severity of abnormal movements (highest score from questions above): None, normal Incapacitation due to abnormal movements: None, normal Patient's awareness of abnormal movements (rate only patient's report): No Awareness, Dental Status Current problems with teeth and/or dentures?: No Does patient usually wear dentures?: No  CIWA:  CIWA-Ar Total: 1 COWS:  COWS Total Score: 2  Musculoskeletal: Strength & Muscle Tone: within normal limits Gait & Station: normal Patient leans: N/A  Psychiatric Specialty Exam: Physical Exam  Nursing note and vitals reviewed. Constitutional: He is oriented to person, place, and time. He appears well-developed and well-nourished.  Cardiovascular: Normal rate.  Respiratory: Effort normal.  Neurological: He is alert and oriented to person, place, and time.    Review of Systems  Constitutional: Negative.   Psychiatric/Behavioral: Positive for depression and substance abuse (hx ETOH, BZDs). Negative for hallucinations, memory loss and suicidal ideas. The patient is not nervous/anxious and does not have insomnia.     Blood pressure (!) 137/95, pulse 75, temperature 98.1 F (36.7 C), temperature source Oral, resp. rate 16, height 6' (1.829 m), weight 68 kg, SpO2 100 %.Body mass index is 20.34 kg/m.  General Appearance: Casual  Eye Contact:  Minimal  Speech:  Normal Rate  Volume:  Decreased  Mood:  Anxious  Affect:   Constricted  Thought Process:  Coherent  Orientation:  Full (Time, Place, and Person)  Thought Content:  WDL  Suicidal Thoughts:  No  Homicidal Thoughts:  No  Memory:  Immediate;   Fair Recent;   Fair  Judgement:  Intact  Insight:  Good  Psychomotor Activity:  Normal  Concentration:  Concentration: Good and Attention Span: Fair  Recall:  [employer] of Knowledge:  Fair  Language:  Good  Akathisia:  No  Handed:  Right  AIMS (if indicated):     Assets:  Communication Skills Desire for Improvement Housing Resilience  ADL's:  Intact  Cognition:  WNL  Sleep:  Number of Hours: 6.25     Treatment Plan Summary: Daily contact with patient to assess and evaluate symptoms and progress in treatment and Medication management   Continue inpatient hospitalization.  Continue Effexor XR 225 mg PO daily for mood/anxiety Continue Trileptal 150 mg PO QAM, 600 mg PO QHS for mood/anxiety Continue Buspar 10 mg PO BID for anxiety Continue Vistaril 25 mg PO Q6HR PRN anxiety Continue Zyprexa 20 mg PO QHS for psychosis Continue amlodipine 10 mg PO daily for HTN Continue Coreg 25 mg PO BID for HTN Continue Keppra 500 mg PO BID for seizure disorder Continue Biotene 15 mL mouth rinse PRN dry mouth  Patient will participate in the therapeutic group milieu.  Discharge disposition in progress.   Aldean Baker, NP 04/20/2018, 11:25 AM   Agree with NP progress note

## 2018-04-21 MED ORDER — VENLAFAXINE HCL ER 75 MG PO CP24: 225.0000 mg | ORAL_CAPSULE | Freq: Every day | ORAL | 0 refills | Status: DC

## 2018-04-21 MED ORDER — OLANZAPINE 20 MG PO TABS: 20.0000 mg | ORAL_TABLET | Freq: Every day | ORAL | 0 refills | Status: DC

## 2018-04-21 MED ORDER — OXCARBAZEPINE 600 MG PO TABS: 600.0000 mg | ORAL_TABLET | Freq: Every day | ORAL | 0 refills | Status: DC

## 2018-04-21 MED ORDER — CARVEDILOL 25 MG PO TABS: 25.0000 mg | ORAL_TABLET | Freq: Two times a day (BID) | ORAL | 0 refills | Status: DC

## 2018-04-21 MED ORDER — OXCARBAZEPINE 150 MG PO TABS: 150.0000 mg | ORAL_TABLET | Freq: Every day | ORAL | 0 refills | Status: DC

## 2018-04-21 MED ORDER — BIOTENE DRY MOUTH MT LIQD: 15.0000 mL | OROMUCOSAL | Status: DC | PRN

## 2018-04-21 MED ORDER — AMLODIPINE BESYLATE 10 MG PO TABS: 10.0000 mg | ORAL_TABLET | Freq: Every day | ORAL | 0 refills | Status: DC

## 2018-04-21 MED ORDER — ACETAMINOPHEN 325 MG PO TABS: 650.0000 mg | ORAL_TABLET | Freq: Four times a day (QID) | ORAL | Status: DC | PRN

## 2018-04-21 MED ORDER — BUSPIRONE HCL 10 MG PO TABS: 10.0000 mg | ORAL_TABLET | Freq: Two times a day (BID) | ORAL | 0 refills | Status: AC

## 2018-04-21 MED ORDER — HYDROXYZINE HCL 25 MG PO TABS: 25.0000 mg | ORAL_TABLET | Freq: Four times a day (QID) | ORAL | 0 refills | Status: DC | PRN

## 2018-04-21 MED ORDER — LEVETIRACETAM 500 MG PO TABS: 500.0000 mg | ORAL_TABLET | Freq: Two times a day (BID) | ORAL | 0 refills | Status: DC

## 2018-04-21 NOTE — Discharge Summary (Signed)
Physician Discharge Summary Note  Patient:  David Dennis is an 53 y.o., male MRN:  [id number] DOB:  1965/03/13 Patient phone:  [phone] (home)  Patient address:   [address] [place] [address],  Total Time spent with patient: 15 minutes  Date of Admission:  04/08/2018 Date of Discharge: 04/21/2018  Reason for Admission:  Overdose on metoprolol, clonazepam, olanzapine  Principal Problem: Paranoid schizophrenia [facility]) Discharge Diagnoses: Principal Problem:   Paranoid schizophrenia (HCC) Active Problems:   Overdose   Social anxiety disorder   AKI (acute kidney injury) (HCC)   MDD (major depressive disorder), severe (HCC)   Past Psychiatric History: Per admission H&P: Patient has had at least one previous psychiatric hospitalization at the [facility] hospital in [place].  He has been followed for the last several years at the [facility] in [facility].  Review of the electronic medical record showed that he had been on his current dosage of medication for several years.  He has been previously diagnosed with depression, social anxiety disorder as well as schizophrenia.  Past Medical History:  Past Medical History:  Diagnosis Date  . Depression   . Generalized anxiety disorder   . Hypertension    History reviewed. No pertinent surgical history. Family History: History reviewed. No pertinent family history. Family Psychiatric  History: Per admission H&P: He stated he has a brother with similar problems to his own. Social History:  Social History   Substance and Sexual Activity  Alcohol Use Yes  . Alcohol/week: 84.0 standard drinks  . Types: 84 Cans of beer per week   Comment: "about a 12 pack of beer everyday"     Social History   Substance and Sexual Activity  Drug Use No    Social History   Socioeconomic History  . Marital status: Single    Spouse name: Not on file  . Number of children: Not on file  . Years of education: Not on file   . Highest education level: Not on file  Occupational History  . Not on file  Social Needs  . Financial resource strain: Not on file  . Food insecurity:    Worry: Not on file    Inability: Not on file  . Transportation needs:    Medical: Not on file    Non-medical: Not on file  Tobacco Use  . Smoking status: Former Smoker    Last attempt to quit: 04/01/2018    Years since quitting: 0.0  . Smokeless tobacco: Never Used  Substance and Sexual Activity  . Alcohol use: Yes    Alcohol/week: 84.0 standard drinks    Types: 84 Cans of beer per week    Comment: "about a 12 pack of beer everyday"  . Drug use: No  . Sexual activity: Not on file  Lifestyle  . Physical activity:    Days per week: Not on file    Minutes per session: Not on file  . Stress: Not on file  Relationships  . Social connections:    Talks on phone: Not on file    Gets together: Not on file    Attends religious service: Not on file    Active member of club or organization: Not on file    Attends meetings of clubs or organizations: Not on file    Relationship status: Not on file  Other Topics Concern  . Not on file  Social History Narrative  . Not on file    Hospital Course:  Per admission H&P 04/09/2018: Patient is a 53 year old male with a reported past psychiatric history significant for depression, anxiety and schizophrenia who originally presented to the [facility] emergency department on 04/01/2018 after an intentional overdose. The patient was found unresponsive with several medication bottles that were empty around him. His medication bottles included metoprolol, clonazepam and olanzapine. He was unresponsive in the field, and was intubated. He remained in the medical side of the hospital until 04/08/2018. During the course of the hospitalization he suffered significant issues including shock liver, acute kidney injury, and cardiomyopathy. On 04/06/2018 his creatinine was at 3.63, his AST was at 375, and  his ALT was at 1993. On 2/26 his creatinine had dropped to 1.88, his AST down to 71, and his ALT at 881. The patient stated that he had had psychiatric problems for several years. Much of this did not occur until after he had been discharged from the [facility]. He has been followed at the [facility] for several years. He most recently has been at the [facility]. He stated he had been previously diagnosed with depression, social anxiety disorder and schizophrenia. He denied any auditory or visual hallucinations. He stated that much of what had led to the diagnosis of schizophrenia was what sounds like ideas of reference and looseness of associations. This seem to be significantly paranoid in nature. He stated on the date that he took the overdose he was tired of being alone, and was searching for a social relationship that would make him feel a value. He currently lives with his mother and is disabled. 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 He has been on a stable dose of venlafaxine extended release, olanzapine and occasional clonazepam. His last clonazepam prescription per the PMP database was in 2018. He stated that he had drank alcohol significantly in the past, but had quit alcohol and benzodiazepines on the same night in November 2019. He suffered seizures after that, and is been on seizure medication ever since. He denied any other substances. His drug screen on admission was significant only for the benzodiazepines that he had overdosed on. His blood alcohol was less than 10. Currently he is not sure how he feels about having survived the suicide attempt. He stated he had one previous significant episode where he was suicidal. He had gone and purchased a weapon, but had to wait 10 days to pick it up. When he discussed this with his psychiatrist he was hospitalized at that time. This was in [place]. He denied any previous exposure to  compact circumstances. He was admitted to the hospital for evaluation and stabilization.  Mr. Pittsley was admitted after overdose on metoprolol, clonazepam, and olanzapine. He was restarted on Effexor XR and Zyprexa. Trileptal was started and titrated for mood stability and anxiety. Buspar and PRN Vistaril were started for anxiety. He was started on Norvasc and Coreg increased for blood pressure. He participated in group therapy on the unit. He responded well to treatment with no adverse effects reported. He remained on the [facility] unit for 13 days. He stabilized with medication and therapy. He was discharged on the medications listed below. He has shown improvement with improved mood, affect, sleep, appetite, and interaction. He denies any SI/HI/AVH and contracts for safety. He agrees to follow up at the [facility] (see below). He is provided with prescriptions and medication samples upon discharge. His parents are picking him up for discharge home.  Physical Findings: AIMS: Facial and  Oral Movements Muscles of Facial Expression: None, normal Lips and Perioral Area: None, normal Jaw: None, normal Tongue: None, normal,Extremity Movements Upper (arms, wrists, hands, fingers): None, normal Lower (legs, knees, ankles, toes): None, normal, Trunk Movements Neck, shoulders, hips: None, normal, Overall Severity Severity of abnormal movements (highest score from questions above): None, normal Incapacitation due to abnormal movements: None, normal Patient's awareness of abnormal movements (rate only patient's report): No Awareness, Dental Status Current problems with teeth and/or dentures?: No Does patient usually wear dentures?: No  CIWA:  CIWA-Ar Total: 1 COWS:  COWS Total Score: 1  Musculoskeletal: Strength & Muscle Tone: within normal limits Gait & Station: normal Patient leans: N/A  Psychiatric Specialty Exam: Physical Exam  Nursing note and vitals reviewed. Constitutional: He is oriented  to person, place, and time. He appears well-developed and well-nourished.  Cardiovascular: Normal rate.  Respiratory: Effort normal.  Neurological: He is alert and oriented to person, place, and time.  Psychiatric: His behavior is normal.    Review of Systems  Constitutional: Negative.   Psychiatric/Behavioral: Positive for depression (improving). Negative for hallucinations, memory loss, substance abuse and suicidal ideas. The patient is not nervous/anxious and does not have insomnia.     Blood pressure 114/90, pulse 78, temperature 98.7 F (37.1 C), resp. rate 18, height 6' (1.829 m), weight 68 kg, SpO2 100 %.Body mass index is 20.34 kg/m.  See MD's discharge SRA     Have you used any form of tobacco in the last 30 days? (Cigarettes, Smokeless Tobacco, Cigars, and/or Pipes): Yes  Has this patient used any form of tobacco in the last 30 days? (Cigarettes, Smokeless Tobacco, Cigars, and/or Pipes)  No  Blood Alcohol level:  Lab Results  Component Value Date   ETH <10 04/01/2018   ETH <5 08/10/2015    Metabolic Disorder Labs:  Lab Results  Component Value Date   HGBA1C 5.5 04/17/2018   MPG 111.15 04/17/2018   No results found for: PROLACTIN Lab Results  Component Value Date   CHOL 182 04/17/2018   TRIG 51 04/17/2018   HDL 42 04/17/2018   CHOLHDL 4.3 04/17/2018   VLDL 10 04/17/2018   LDLCALC 130 (H) 04/17/2018    See Psychiatric Specialty Exam and Suicide Risk Assessment completed by Attending Physician prior to discharge.  Discharge destination:  Home  Is patient on multiple antipsychotic therapies at discharge:  No   Has Patient had three or more failed trials of antipsychotic monotherapy by history:  No  Recommended Plan for Multiple Antipsychotic Therapies: NA  Discharge Instructions    Discharge instructions   Complete by:  As directed    Patient is instructed to take all prescribed medications as recommended. Report any side effects or adverse reactions to  your outpatient psychiatrist. Patient is instructed to abstain from alcohol and illegal drugs while on prescription medications. In the event of worsening symptoms, patient is instructed to call the crisis hotline, 911, or go to the nearest emergency department for evaluation and treatment.     Allergies as of 04/21/2018   No Known Allergies     Medication List    STOP taking these medications   feeding supplement (ENSURE ENLIVE) Liqd   folic acid 1 MG tablet Commonly known as:  FOLVITE   gabapentin 100 MG capsule Commonly known as:  NEURONTIN   QUEtiapine 25 MG tablet Commonly known as:  SEROQUEL   thiamine 100 MG tablet     TAKE these medications     Indication  acetaminophen 325 MG tablet Commonly known as:  TYLENOL Take 2 tablets (650 mg total) by mouth every 6 (six) hours as needed for mild pain or headache.  Indication:  Pain   amLODipine 10 MG tablet Commonly known as:  NORVASC Take 1 tablet (10 mg total) by mouth daily. For high blood pressure Start taking on:  April 22, 2018  Indication:  High Blood Pressure Disorder   antiseptic oral rinse Liqd 15 mLs by Mouth Rinse route as needed for dry mouth.  Indication:  Dry mouth   busPIRone 10 MG tablet Commonly known as:  BUSPAR Take 1 tablet (10 mg total) by mouth 2 (two) times daily. For anxiety  Indication:  Anxiety Disorder   carvedilol 25 MG tablet Commonly known as:  COREG Take 1 tablet (25 mg total) by mouth 2 (two) times daily with a meal. For high blood pressure What changed:    medication strength  how much to take  additional instructions  Indication:  High Blood Pressure Disorder   hydrOXYzine 25 MG tablet Commonly known as:  ATARAX/VISTARIL Take 1 tablet (25 mg total) by mouth every 6 (six) hours as needed for anxiety.  Indication:  Feeling Anxious   levETIRAcetam 500 MG tablet Commonly known as:  Keppra Take 1 tablet (500 mg total) by mouth 2 (two) times daily. For seizures What  changed:  additional instructions  Indication:  Seizure   MULTIVITAL PO Take 1 tablet by mouth daily.  Indication:  Supplementation   OLANZapine 20 MG tablet Commonly known as:  ZYPREXA Take 1 tablet (20 mg total) by mouth at bedtime.  Indication:  Psychotic Depressive Illness   oxcarbazepine 600 MG tablet Commonly known as:  TRILEPTAL Take 1 tablet (600 mg total) by mouth at bedtime. For mood  Indication:  Mood   OXcarbazepine 150 MG tablet Commonly known as:  TRILEPTAL Take 1 tablet (150 mg total) by mouth daily. For mood Start taking on:  April 22, 2018  Indication:  Mood   venlafaxine XR 75 MG 24 hr capsule Commonly known as:  EFFEXOR-XR Take 3 capsules (225 mg total) by mouth daily with breakfast. For mood Start taking on:  April 22, 2018  Indication:  Mood      Follow-up Information    [facility] Follow up.   Why:  Patient must walk-in for an assessment to receive services. Clinic walk-in hours are Monday-Friday 8:00a.-4:00p. Please follow up within 3 days of discharge. Be sure to  bring your photo ID, proof of insurance, SSN, and current medications.   Contact information: [address] [facility] [place] [address] [phone]           Follow-up recommendations: Activity as tolerated. Diet as recommended by primary care physician. Keep all scheduled follow-up appointments as recommended.   Comments:   Patient is instructed to take all prescribed medications as recommended. Report any side effects or adverse reactions to your outpatient psychiatrist. Patient is instructed to abstain from alcohol and illegal drugs while on prescription medications. In the event of worsening symptoms, patient is instructed to call the crisis hotline, 911, or go to the nearest emergency department for evaluation and treatment.  Signed: Aldean Baker, NP 04/21/2018, 1:51 PM

## 2018-04-21 NOTE — Progress Notes (Addendum)
Advanced Heart Failure Clinic Note    PCP: [facility] PCP-Cardiologist: Dr Shirlee Latch  HPI: David Dennis is a 53 y.o. male with h/o depression, anxiety, HTN, and seizure disorder.   Admitted 2/19-2/26/20 after overdose on metoprolol, clonazepam, and olanzapine. He was found unresponsive and pulseless with empty pill bottles around him. Received 3 minutes of CPR with ROSC and was intubated. Had AKI with creatinine peak 7.0 and shock liver. Echo was completed and showed newly reduced EF 20-25%. HF team consulted. CM thought to be from myocardial stunning with arrest/hypotension. He was started on coreg. Other HF meds limited with AKI. We planned to follow up if he stabilized for further HF management/work up. He was extubated and weaned to RA. He was also treated for PNA thought to be aspiration during arrest. Psych consulted and meds adjusted. He was discharged to inpatient psych.   He was discharged from inpatient psych yesterday. He presents today for post hospital follow up. No SOB. No problems walking 15-20 minutes with mild incline. No edema, orthopnea, or PND. No CP or dizziness. Mood much improved. Appetite improved. He does not have insurance, but gets disability through the [place] and had good amount is savings, so he is not worried about paying for medications.   Echo 04/01/18 LVEF 20-25%, RV moderately down, Mild LAE, Degenerative MV, MR mild by doppler.   FH: father with cardiac history (valve surgery) in 45s. None otherwise  SH: Back at home with parents in [place]. Able to afford medications with savings account. Medicaid pending. No ETOH since November 2019, smokes 1 ppd, no drug use. No transportation concerns.    Review of systems complete and found to be negative unless listed in HPI.   Past Medical History:  Diagnosis Date  . Depression   . Generalized anxiety disorder   . Hypertension     Current Outpatient Medications  Medication Sig Dispense  Refill  . acetaminophen (TYLENOL) 325 MG tablet Take 2 tablets (650 mg total) by mouth every 6 (six) hours as needed for mild pain or headache.    Marland Kitchen amLODipine (NORVASC) 10 MG tablet Take 1 tablet (10 mg total) by mouth daily. For high blood pressure 30 tablet 0  . antiseptic oral rinse (BIOTENE) LIQD 15 mLs by Mouth Rinse route as needed for dry mouth.    . busPIRone (BUSPAR) 10 MG tablet Take 1 tablet (10 mg total) by mouth 2 (two) times daily. For anxiety 60 tablet 0  . carvedilol (COREG) 25 MG tablet Take 1 tablet (25 mg total) by mouth 2 (two) times daily with a meal. For high blood pressure 60 tablet 0  . hydrOXYzine (ATARAX/VISTARIL) 25 MG tablet Take 1 tablet (25 mg total) by mouth every 6 (six) hours as needed for anxiety. 30 tablet 0  . levETIRAcetam (KEPPRA) 500 MG tablet Take 1 tablet (500 mg total) by mouth 2 (two) times daily. For seizures 60 tablet 0  . Multiple Vitamins-Minerals (MULTIVITAL PO) Take 1 tablet by mouth daily.    Marland Kitchen OLANZapine (ZYPREXA) 20 MG tablet Take 1 tablet (20 mg total) by mouth at bedtime. 30 tablet 0  . OXcarbazepine (TRILEPTAL) 150 MG tablet Take 1 tablet (150 mg total) by mouth daily. For mood 30 tablet 0  . Oxcarbazepine (TRILEPTAL) 600 MG tablet Take 1 tablet (600 mg total) by mouth at bedtime. For mood 30 tablet 0  . venlafaxine XR (EFFEXOR-XR) 75 MG 24 hr capsule Take 3 capsules (225 mg total) by mouth  daily with breakfast. For mood 90 capsule 0   No current facility-administered medications for this encounter.     No Known Allergies    Social History   Socioeconomic History  . Marital status: Single    Spouse name: Not on file  . Number of children: Not on file  . Years of education: Not on file  . Highest education level: Not on file  Occupational History  . Not on file  Social Needs  . Financial resource strain: Not on file  . Food insecurity:    Worry: Not on file    Inability: Not on file  . Transportation needs:    Medical: Not on  file    Non-medical: Not on file  Tobacco Use  . Smoking status: Light Tobacco Smoker    Last attempt to quit: 04/01/2018    Years since quitting: 0.0  . Smokeless tobacco: Never Used  Substance and Sexual Activity  . Alcohol use: Yes    Alcohol/week: 84.0 standard drinks    Types: 84 Cans of beer per week    Comment: "about a 12 pack of beer everyday"  . Drug use: No  . Sexual activity: Not on file  Lifestyle  . Physical activity:    Days per week: Not on file    Minutes per session: Not on file  . Stress: Not on file  Relationships  . Social connections:    Talks on phone: Not on file    Gets together: Not on file    Attends religious service: Not on file    Active member of club or organization: Not on file    Attends meetings of clubs or organizations: Not on file    Relationship status: Not on file  . Intimate partner violence:    Fear of current or ex partner: Not on file    Emotionally abused: Not on file    Physically abused: Not on file    Forced sexual activity: Not on file  Other Topics Concern  . Not on file  Social History Narrative  . Not on file     No family history on file.  Vitals:   04/22/18 1003  BP: 124/76  Pulse: 71  SpO2: 99%  Weight: 74.9 kg (165 lb 3.2 oz)   Wt Readings from Last 3 Encounters:  04/22/18 74.9 kg (165 lb 3.2 oz)  04/08/18 68 kg (150 lb)  04/08/18 67.8 kg (149 lb 6.4 oz)    PHYSICAL EXAM: General:  No respiratory difficulty HEENT: normal Neck: supple. JVP ~6. Carotids 2+ bilat; no bruits. No lymphadenopathy or thyromegaly appreciated. Cor: PMI nondisplaced. Regular rate & rhythm. No rubs, gallops or murmurs. Lungs: clear Abdomen: soft, nontender, nondistended. No hepatosplenomegaly. No bruits or masses. Good bowel sounds. Extremities: no cyanosis, clubbing, rash, BLE trace ankle edema.  Neuro: alert & oriented x 3, cranial nerves grossly intact. moves all 4 extremities w/o difficulty. Affect nervous.    ECG:   ASSESSMENT & PLAN:  1. Chronic systolic CHF  - Echo 04/01/18 LVEF 20-25%, RV moderately down, Mild LAE, Degenerative MV, MR mild by doppler.  - Thought to be secondary to cardiac stunning in setting of PEA arrest.  - NYHA I. Volume stable on exam. - Continue coreg 25 mg BID - Had AKI with creatinine up to 7.0 while inpatient. Creatinine normal 0.87 last week. - Stop amlodipine. Start Entresto 24/26 mg BID. He does not want to enroll in patient assistance. He has savings to pay  for it. Provided 2 weeks of samples.  - Not interested in mens support group.  - Spoke with Dr Shirlee LatchMcLean. Will repeat echo prior to considering ischemic work up. If EF has improved, he can be discharged from AHF program. He does not want to stay on Entresto long term if EF is back to normal and likely does not need to. He will need to have his PCP write the prescription and [facility] if he does stay on Entresto since the [facility] did not refer [facility].  - We discussed at length the purpose of the HF clinic and steps going forward.   2. Depression/Anxiety - Discharged from inpatient psych yesterday. Much improved.   3. Recent AKI - Creatinine stable 0.87 on 04/17/18  4. Hyperkalemia - In setting of AKI. K 3.7 on 04/17/18. Recheck in 2 weeks after addition of Entresto.  5. ETOH abuse - No longer drinking. Stopped in November.  6. 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 ? Endocarditis. Blood cultures were negative.   7. H/o seizures - On Keppra. Per primary.   8. DNR  Stop amlodipine Start Entresto 24/26 mg BID. He will let us know if he gets dizzy.  Repeat echo. If EF normal, does not need AHF follow up. Likely could stop Entresto (He could get through [facility], but PCP has to write Rx and then fax to them) 2 weeks follow up with [facility]   Alford HighlandAshley M Shimon Trowbridge, NP 04/22/18  Greater than 50% of the 35 minute visit was spent in counseling/coordination of care regarding disease state education, salt/fluid  restriction, sliding scale diuretics, and medication compliance.

## 2018-04-21 NOTE — Plan of Care (Signed)
Discharge note  Patient verbalizes readiness for discharge. Follow up plan explained, AVS, Transition record and SRA given. Prescriptions and teaching provided. Belongings returned and signed for. Suicide safety plan completed and signed. Patient verbalizes understanding. Patient denies SI/HI and assures this Clinical research associate he will seek assistance should that change. Patient discharged to lobby where parents were waiting.  Problem: Education: Goal: Knowledge of [place] [facility] Education information/materials will improve Outcome: Adequate for Discharge Goal: Emotional status will improve Outcome: Adequate for Discharge Goal: Mental status will improve Outcome: Adequate for Discharge Goal: Verbalization of understanding the information provided will improve Outcome: Adequate for Discharge   Problem: Activity: Goal: Interest or engagement in activities will improve Outcome: Adequate for Discharge Goal: Sleeping patterns will improve Outcome: Adequate for Discharge   Problem: Coping: Goal: Ability to verbalize frustrations and anger appropriately will improve Outcome: Adequate for Discharge Goal: Ability to demonstrate self-control will improve Outcome: Adequate for Discharge   Problem: Health Behavior/Discharge Planning: Goal: Identification of resources available to assist in meeting health care needs will improve Outcome: Adequate for Discharge Goal: Compliance with treatment plan for underlying cause of condition will improve Outcome: Adequate for Discharge   Problem: Physical Regulation: Goal: Ability to maintain clinical measurements within normal limits will improve Outcome: Adequate for Discharge   Problem: Safety: Goal: Periods of time without injury will increase Outcome: Adequate for Discharge   Problem: Education: Goal: Utilization of techniques to improve thought processes will improve Outcome: Adequate for Discharge Goal: Knowledge of the prescribed therapeutic  regimen will improve Outcome: Adequate for Discharge   Problem: Activity: Goal: Interest or engagement in leisure activities will improve Outcome: Adequate for Discharge Goal: Imbalance in normal sleep/wake cycle will improve Outcome: Adequate for Discharge   Problem: Coping: Goal: Coping ability will improve Outcome: Adequate for Discharge Goal: Will verbalize feelings Outcome: Adequate for Discharge   Problem: Health Behavior/Discharge Planning: Goal: Ability to make decisions will improve Outcome: Adequate for Discharge Goal: Compliance with therapeutic regimen will improve Outcome: Adequate for Discharge   Problem: Role Relationship: Goal: Will demonstrate positive changes in social behaviors and relationships Outcome: Adequate for Discharge   Problem: Safety: Goal: Ability to disclose and discuss suicidal ideas will improve Outcome: Adequate for Discharge Goal: Ability to identify and utilize support systems that promote safety will improve Outcome: Adequate for Discharge   Problem: Self-Concept: Goal: Will verbalize positive feelings about self Outcome: Adequate for Discharge Goal: Level of anxiety will decrease Outcome: Adequate for Discharge   Problem: Education: Goal: Ability to state activities that reduce stress will improve Outcome: Adequate for Discharge   Problem: Coping: Goal: Ability to identify and develop effective coping behavior will improve Outcome: Adequate for Discharge   Problem: Self-Concept: Goal: Ability to identify factors that promote anxiety will improve Outcome: Adequate for Discharge Goal: Level of anxiety will decrease Outcome: Adequate for Discharge Goal: Ability to modify response to factors that promote anxiety will improve Outcome: Adequate for Discharge   Problem: Education: Goal: Ability to make informed decisions regarding treatment will improve Outcome: Adequate for Discharge   Problem: Coping: Goal: Coping ability  will improve Outcome: Adequate for Discharge   Problem: Health Behavior/Discharge Planning: Goal: Identification of resources available to assist in meeting health care needs will improve Outcome: Adequate for Discharge   Problem: Medication: Goal: Compliance with prescribed medication regimen will improve Outcome: Adequate for Discharge   Problem: Self-Concept: Goal: Ability to disclose and discuss suicidal ideas will improve Outcome: Adequate for Discharge Goal: Will verbalize positive  feelings about self Outcome: Adequate for Discharge

## 2018-04-21 NOTE — Progress Notes (Signed)
  [facility] Adult Case Management Discharge Plan :  Will you be returning to the same living situation after discharge:  Yes,  patient reports he is returning home with his parents At discharge, do you have transportation home?: Yes,  patient reports his mother is picking him up at discharge Do you have the ability to pay for your medications: No.  Release of information consent forms completed and in the chart;  Patient's signature needed at discharge.  Patient to Follow up at: Follow-up Information    [facility].   Why:  Patient must walk-in for an assessment to receive services. Clinic walk-in hours are Monday-Friday 8:00a.-4:00p. Please follow up within 3 days of discharge. Be sure to  bring your photo ID, proof of insurance, SSN, and current medications.   Contact information: [address] [facility] [place] [address] [phone]           Next level of care provider has access to [facility] Link:yes  Safety Planning and Suicide Prevention discussed: Yes,  with the patient's mother  Have you used any form of tobacco in the last 30 days? (Cigarettes, Smokeless Tobacco, Cigars, and/or Pipes): Yes  Has patient been referred to the Quitline?: Patient refused referral  Patient has been referred for addiction treatment: N/A  RUPPERT MCLENNON, LCSWA 04/21/2018, 2:44 PM

## 2018-04-21 NOTE — Progress Notes (Signed)
D:  David Dennis was up and visible on the unit.  He attended evening wrap up group.  He denied SI/HI or A/V hallucinations.  He denied any pain or discomfort and appeared to be in no physical distress.  He continues to complain of anxiety and having difficulty of being around others.  "I try but it is difficult."  He took his hs medications without difficulty and requested hydroxyzine at hs as well.  He is currently resting with his eyes closed and appeared to be asleep. A:  1:1 with RN for support and encouragement.  Medications as ordered.  Q 15 minute checks maintained for safety.  Encouraged participation in group and unit activities.   R:  David Dennis remains safe on the unit.  We will continue to monitor the progress towards his goals.

## 2018-04-21 NOTE — [facility] Suicide Risk Assessment (Signed)
[facility] Discharge Suicide Risk Assessment   Principal Problem: Paranoid schizophrenia [facility]) Discharge Diagnoses: Principal Problem:   Paranoid schizophrenia (HCC) Active Problems:   Overdose   Social anxiety disorder   MDD (major depressive disorder), severe (HCC)   AKI (acute kidney injury) (HCC)   Total Time spent with patient: 15 minutes  Musculoskeletal: Strength & Muscle Tone: within normal limits Gait & Station: normal Patient leans: N/A  Psychiatric Specialty Exam: Review of Systems  All other systems reviewed and are negative.   Blood pressure 114/90, pulse 78, temperature 98.7 F (37.1 C), resp. rate 18, height 6' (1.829 m), weight 68 kg, SpO2 100 %.Body mass index is 20.34 kg/m.  General Appearance: Casual  Eye Contact::  Fair  Speech:  Normal Rate409  Volume:  Normal  Mood:  Euthymic  Affect:  Congruent  Thought Process:  Coherent and Descriptions of Associations: Intact  Orientation:  Full (Time, Place, and Person)  Thought Content:  Logical  Suicidal Thoughts:  No  Homicidal Thoughts:  No  Memory:  Immediate;   Fair Recent;   Fair Remote;   Fair  Judgement:  Fair  Insight:  Fair  Psychomotor Activity:  Normal  Concentration:  Fair  Recall:  [employer] of Knowledge:Fair  Language: Good  Akathisia:  Negative  Handed:  Right  AIMS (if indicated):     Assets:  Desire for Improvement Financial Resources/Insurance Housing Leisure Time Resilience Social Support  Sleep:  Number of Hours: 6.25  Cognition: WNL  ADL's:  Intact   Mental Status Per Nursing Assessment::   On Admission:  Self-harm thoughts  Demographic Factors:  Male, Caucasian and Unemployed  Loss Factors: NA  Historical Factors: Impulsivity  Risk Reduction Factors:   Sense of responsibility to family, Living with another person, especially a relative, Positive social support and Positive therapeutic relationship  Continued Clinical Symptoms:  Schizophrenia:   Paranoid or  undifferentiated type  Cognitive Features That Contribute To Risk:  None    Suicide Risk:  Minimal: No identifiable suicidal ideation.  Patients presenting with no risk factors but with morbid ruminations; may be classified as minimal risk based on the severity of the depressive symptoms  Follow-up Information    [facility] Follow up.   Why:  Patient must walk-in for an assessment to receive services. Clinic walk-in hours are Monday-Friday 8:00a.-4:00p. Please follow up within 3 days of discharge. Be sure to  bring your photo ID, proof of insurance, SSN, and current medications.   Contact information: [address] [facility] [place] [address] [phone]           Plan Of Care/Follow-up recommendations:  Activity:  ad lib  Antonieta Pert, MD 04/21/2018, 9:18 AM

## 2018-04-22 ENCOUNTER — Other Ambulatory Visit: Payer: Self-pay

## 2018-04-22 ENCOUNTER — Encounter (HOSPITAL_COMMUNITY): Payer: Self-pay

## 2018-04-22 ENCOUNTER — Ambulatory Visit (HOSPITAL_COMMUNITY)
Admit: 2018-04-22 | Discharge: 2018-04-22 | Disposition: A | Discharge status: Home / Self Care | Payer: Self-pay | Source: Ambulatory Visit | Attending: Cardiology | Admitting: Cardiology

## 2018-04-22 VITALS — BP 124/76 | HR 71 | Wt 165.2 lb

## 2018-04-22 DIAGNOSIS — F329 Major depressive disorder, single episode, unspecified: Secondary | ICD-10-CM | POA: Insufficient documentation

## 2018-04-22 DIAGNOSIS — F32A Depression, unspecified: Secondary | ICD-10-CM

## 2018-04-22 DIAGNOSIS — I5022 Chronic systolic (congestive) heart failure: Secondary | ICD-10-CM | POA: Insufficient documentation

## 2018-04-22 DIAGNOSIS — N179 Acute kidney failure, unspecified: Secondary | ICD-10-CM | POA: Insufficient documentation

## 2018-04-22 DIAGNOSIS — F419 Anxiety disorder, unspecified: Secondary | ICD-10-CM | POA: Insufficient documentation

## 2018-04-22 DIAGNOSIS — Z66 Do not resuscitate: Secondary | ICD-10-CM | POA: Insufficient documentation

## 2018-04-22 DIAGNOSIS — I110 Hypertensive heart disease with heart failure: Secondary | ICD-10-CM | POA: Insufficient documentation

## 2018-04-22 DIAGNOSIS — F17210 Nicotine dependence, cigarettes, uncomplicated: Secondary | ICD-10-CM | POA: Insufficient documentation

## 2018-04-22 DIAGNOSIS — Z79899 Other long term (current) drug therapy: Secondary | ICD-10-CM | POA: Insufficient documentation

## 2018-04-22 DIAGNOSIS — E875 Hyperkalemia: Secondary | ICD-10-CM | POA: Insufficient documentation

## 2018-04-22 DIAGNOSIS — G40909 Epilepsy, unspecified, not intractable, without status epilepticus: Secondary | ICD-10-CM | POA: Insufficient documentation

## 2018-04-22 DIAGNOSIS — Z7901 Long term (current) use of anticoagulants: Secondary | ICD-10-CM | POA: Insufficient documentation

## 2018-04-22 MED ORDER — SACUBITRIL-VALSARTAN 24-26 MG PO TABS: 1.0000 | ORAL_TABLET | Freq: Two times a day (BID) | ORAL | 6 refills | Status: DC

## 2018-04-22 MED ORDER — SACUBITRIL-VALSARTAN 24-26 MG PO TABS: 1.0000 | ORAL_TABLET | Freq: Two times a day (BID) | ORAL | 3 refills | Status: DC

## 2018-04-22 NOTE — Progress Notes (Signed)
Medication Samples have been provided to the patient.  Drug name: Entrestp       Strength: 24/26mg         Qty: 28  LOT: [id number]  Exp.Date: 3/22  Dosing instructions: 1 tab twice a day  The patient has been instructed regarding the correct time, dose, and frequency of taking this medication, including desired effects and most common side effects.   Marisa Hua 10:46 AM 04/22/2018

## 2018-04-22 NOTE — Progress Notes (Signed)
CSW consulted to discuss [facility] assistance with patient.  CSW met with pt who confirms he does not have insurance coverage at this time but is an active member of the [employer] where he gets his other medications.  CSW called [place] who state pt will have to bring paper copy of his script to his [place] PCP so the PCP can rewrite the prescription under their name- cannot fill the script under our MD name since he was not a direct referral from the [place].  CSW also had pt complete [employer] application in case unable to get [place] through [place]- completed application faxed in for review- fax confirmation received  CSW will continue to follow and assist as needed  Jorge Ny, [place] Worker [facility] [phone]

## 2018-04-22 NOTE — Patient Instructions (Addendum)
STOP Amlodipine  START Entresto 24/26mg  (1 tab) twice a day  IF YOU FEEL DIZZY AFTER TAKING MEDICINE, PLEASE CONTACT OUR OFFICE IMMEDIATELY [phone] OPT 2.  Your physician has requested that you have an echocardiogram. Echocardiography is a painless test that uses sound waves to create images of your heart. It provides your doctor with information about the size and shape of your heart and how well your heart's chambers and valves are working. This procedure takes approximately one hour. There are no restrictions for this procedure.  Your physician recommends that you schedule a follow-up appointment in: 2 weeks in NP/PA clinic with an ECHO prior

## 2018-05-04 ENCOUNTER — Other Ambulatory Visit (HOSPITAL_COMMUNITY): Payer: Self-pay | Admitting: Cardiology

## 2018-05-04 MED ORDER — SACUBITRIL-VALSARTAN 24-26 MG PO TABS: 1.0000 | ORAL_TABLET | Freq: Two times a day (BID) | ORAL | 1 refills | Status: DC

## 2018-05-06 ENCOUNTER — Encounter (HOSPITAL_COMMUNITY): Payer: Self-pay

## 2018-05-06 ENCOUNTER — Other Ambulatory Visit (HOSPITAL_COMMUNITY): Payer: Self-pay

## 2018-07-08 ENCOUNTER — Encounter (HOSPITAL_COMMUNITY): Payer: Self-pay

## 2018-07-08 ENCOUNTER — Other Ambulatory Visit (HOSPITAL_COMMUNITY): Payer: Self-pay

## 2018-08-18 ENCOUNTER — Other Ambulatory Visit: Payer: Self-pay

## 2018-08-18 ENCOUNTER — Ambulatory Visit (HOSPITAL_COMMUNITY): Admission: RE | Admit: 2018-08-18 | Payer: Non-veteran care | Source: Ambulatory Visit

## 2018-08-18 ENCOUNTER — Telehealth (HOSPITAL_COMMUNITY): Payer: Self-pay | Admitting: Licensed Clinical Social Worker

## 2018-08-18 NOTE — Telephone Encounter (Signed)
CSW was approved for [employer] patient assistance for [employer].  Approved 05/12/2018- 05/12/2019 Pt ID: [id number]  Jorge Ny, LCSW Clinical Social Worker [facility] Desk#: [phone] Cell#: [phone]

## 2018-09-01 ENCOUNTER — Other Ambulatory Visit (HOSPITAL_COMMUNITY): Payer: Non-veteran care

## 2018-09-09 ENCOUNTER — Other Ambulatory Visit (HOSPITAL_COMMUNITY): Payer: Non-veteran care

## 2018-09-09 ENCOUNTER — Encounter (HOSPITAL_COMMUNITY): Payer: Self-pay

## 2018-09-09 ENCOUNTER — Encounter (HOSPITAL_COMMUNITY): Payer: Non-veteran care

## 2018-10-21 ENCOUNTER — Emergency Department (HOSPITAL_COMMUNITY): Payer: No Typology Code available for payment source

## 2018-10-21 ENCOUNTER — Inpatient Hospital Stay (HOSPITAL_COMMUNITY)
Admission: EM | Admit: 2018-10-21 | Discharge: 2018-10-27 | DRG: 917 | Disposition: A | Discharge status: Other Acute Inpatient Hospital | Payer: No Typology Code available for payment source | Attending: General Surgery | Admitting: General Surgery

## 2018-10-21 ENCOUNTER — Inpatient Hospital Stay (HOSPITAL_COMMUNITY): Payer: No Typology Code available for payment source

## 2018-10-21 ENCOUNTER — Other Ambulatory Visit: Payer: Self-pay

## 2018-10-21 ENCOUNTER — Encounter (HOSPITAL_COMMUNITY): Payer: Self-pay

## 2018-10-21 DIAGNOSIS — S40211A Abrasion of right shoulder, initial encounter: Secondary | ICD-10-CM | POA: Diagnosis present

## 2018-10-21 DIAGNOSIS — R402222 Coma scale, best verbal response, incomprehensible words, at arrival to emergency department: Secondary | ICD-10-CM | POA: Diagnosis present

## 2018-10-21 DIAGNOSIS — Z8674 Personal history of sudden cardiac arrest: Secondary | ICD-10-CM

## 2018-10-21 DIAGNOSIS — Z978 Presence of other specified devices: Secondary | ICD-10-CM | POA: Diagnosis present

## 2018-10-21 DIAGNOSIS — E876 Hypokalemia: Secondary | ICD-10-CM | POA: Diagnosis not present

## 2018-10-21 DIAGNOSIS — I712 Thoracic aortic aneurysm, without rupture: Secondary | ICD-10-CM | POA: Diagnosis present

## 2018-10-21 DIAGNOSIS — R402352 Coma scale, best motor response, localizes pain, at arrival to emergency department: Secondary | ICD-10-CM | POA: Diagnosis present

## 2018-10-21 DIAGNOSIS — G40909 Epilepsy, unspecified, not intractable, without status epilepticus: Secondary | ICD-10-CM | POA: Diagnosis present

## 2018-10-21 DIAGNOSIS — W109XXA Fall (on) (from) unspecified stairs and steps, initial encounter: Secondary | ICD-10-CM | POA: Diagnosis present

## 2018-10-21 DIAGNOSIS — Y92009 Unspecified place in unspecified non-institutional (private) residence as the place of occurrence of the external cause: Secondary | ICD-10-CM

## 2018-10-21 DIAGNOSIS — Z23 Encounter for immunization: Secondary | ICD-10-CM | POA: Diagnosis not present

## 2018-10-21 DIAGNOSIS — Z20828 Contact with and (suspected) exposure to other viral communicable diseases: Secondary | ICD-10-CM | POA: Diagnosis present

## 2018-10-21 DIAGNOSIS — F200 Paranoid schizophrenia: Secondary | ICD-10-CM | POA: Diagnosis present

## 2018-10-21 DIAGNOSIS — F17210 Nicotine dependence, cigarettes, uncomplicated: Secondary | ICD-10-CM | POA: Diagnosis present

## 2018-10-21 DIAGNOSIS — T50902A Poisoning by unspecified drugs, medicaments and biological substances, intentional self-harm, initial encounter: Secondary | ICD-10-CM | POA: Diagnosis present

## 2018-10-21 DIAGNOSIS — R569 Unspecified convulsions: Secondary | ICD-10-CM | POA: Diagnosis not present

## 2018-10-21 DIAGNOSIS — T1491XA Suicide attempt, initial encounter: Secondary | ICD-10-CM | POA: Diagnosis not present

## 2018-10-21 DIAGNOSIS — T43212A Poisoning by selective serotonin and norepinephrine reuptake inhibitors, intentional self-harm, initial encounter: Secondary | ICD-10-CM | POA: Diagnosis present

## 2018-10-21 DIAGNOSIS — R402112 Coma scale, eyes open, never, at arrival to emergency department: Secondary | ICD-10-CM | POA: Diagnosis present

## 2018-10-21 DIAGNOSIS — Z79899 Other long term (current) drug therapy: Secondary | ICD-10-CM | POA: Diagnosis not present

## 2018-10-21 DIAGNOSIS — S065X9A Traumatic subdural hemorrhage with loss of consciousness of unspecified duration, initial encounter: Secondary | ICD-10-CM | POA: Diagnosis present

## 2018-10-21 DIAGNOSIS — T50992A Poisoning by other drugs, medicaments and biological substances, intentional self-harm, initial encounter: Secondary | ICD-10-CM | POA: Diagnosis not present

## 2018-10-21 DIAGNOSIS — G40901 Epilepsy, unspecified, not intractable, with status epilepticus: Secondary | ICD-10-CM | POA: Diagnosis not present

## 2018-10-21 DIAGNOSIS — S0011XA Contusion of right eyelid and periocular area, initial encounter: Secondary | ICD-10-CM | POA: Diagnosis present

## 2018-10-21 DIAGNOSIS — J9601 Acute respiratory failure with hypoxia: Secondary | ICD-10-CM | POA: Diagnosis present

## 2018-10-21 DIAGNOSIS — F339 Major depressive disorder, recurrent, unspecified: Secondary | ICD-10-CM | POA: Diagnosis not present

## 2018-10-21 DIAGNOSIS — I10 Essential (primary) hypertension: Secondary | ICD-10-CM | POA: Diagnosis present

## 2018-10-21 DIAGNOSIS — T43592A Poisoning by other antipsychotics and neuroleptics, intentional self-harm, initial encounter: Secondary | ICD-10-CM | POA: Diagnosis present

## 2018-10-21 DIAGNOSIS — F1010 Alcohol abuse, uncomplicated: Secondary | ICD-10-CM | POA: Diagnosis present

## 2018-10-21 DIAGNOSIS — Z915 Personal history of self-harm: Secondary | ICD-10-CM

## 2018-10-21 DIAGNOSIS — F329 Major depressive disorder, single episode, unspecified: Secondary | ICD-10-CM | POA: Diagnosis present

## 2018-10-21 DIAGNOSIS — G9349 Other encephalopathy: Secondary | ICD-10-CM | POA: Diagnosis present

## 2018-10-21 DIAGNOSIS — S065XAA Traumatic subdural hemorrhage with loss of consciousness status unknown, initial encounter: Secondary | ICD-10-CM

## 2018-10-21 DIAGNOSIS — J9690 Respiratory failure, unspecified, unspecified whether with hypoxia or hypercapnia: Secondary | ICD-10-CM

## 2018-10-21 HISTORY — DX: Paranoid schizophrenia: F20.0

## 2018-10-21 HISTORY — DX: Cardiac arrest, cause unspecified: I46.9

## 2018-10-21 HISTORY — DX: Acute kidney failure, unspecified: N17.9

## 2018-10-21 LAB — COMPREHENSIVE METABOLIC PANEL
ALT: 17 U/L (ref 0–44)
AST: 28 U/L (ref 15–41)
Albumin: 4.4 g/dL (ref 3.5–5.0)
Alkaline Phosphatase: 83 U/L (ref 38–126)
Anion gap: 11 (ref 5–15)
BUN: 8 mg/dL (ref 6–20)
CO2: 24 mmol/L (ref 22–32)
Calcium: 9.3 mg/dL (ref 8.9–10.3)
Chloride: 105 mmol/L (ref 98–111)
Creatinine, Ser: 0.83 mg/dL (ref 0.61–1.24)
GFR calc Af Amer: >60 mL/min (ref >60)
GFR calc non Af Amer: >60 mL/min (ref >60)
Glucose, Bld: 90 mg/dL (ref 70–99)
Potassium: 3.6 mmol/L (ref 3.5–5.1)
Sodium: 140 mmol/L (ref 135–145)
Total Bilirubin: 0.5 mg/dL (ref 0.3–1.2)
Total Protein: 7.5 g/dL (ref 6.5–8.1)

## 2018-10-21 LAB — SARS CORONAVIRUS 2 (TAT 6-24 HRS): SARS Coronavirus 2: NEGATIVE

## 2018-10-21 LAB — URINALYSIS, ROUTINE W REFLEX MICROSCOPIC
Bilirubin Urine: NEGATIVE
Glucose, UA: NEGATIVE mg/dL
Hgb urine dipstick: NEGATIVE
Ketones, ur: NEGATIVE mg/dL
Leukocytes,Ua: NEGATIVE
Nitrite: NEGATIVE
Protein, ur: NEGATIVE mg/dL
Specific Gravity, Urine: 1.011 (ref 1.005–1.030)
pH: 7 (ref 5.0–8.0)

## 2018-10-21 LAB — CBC
HCT: 40.7 % (ref 39.0–52.0)
Hemoglobin: 14.1 g/dL (ref 13.0–17.0)
MCH: 33.9 pg (ref 26.0–34.0)
MCHC: 34.6 g/dL (ref 30.0–36.0)
MCV: 97.8 fL (ref 80.0–100.0)
Platelets: 228 10*3/uL (ref 150–400)
RBC: 4.16 MIL/uL — ABNORMAL LOW (ref 4.22–5.81)
RDW: 14.5 % (ref 11.5–15.5)
WBC: 10.7 10*3/uL — ABNORMAL HIGH (ref 4.0–10.5)
nRBC: 0 % (ref 0.0–0.2)

## 2018-10-21 LAB — POCT I-STAT 7, (LYTES, BLD GAS, ICA,H+H)
Acid-Base Excess: 4 mmol/L — ABNORMAL HIGH (ref 0.0–2.0)
Bicarbonate: 28.3 mmol/L — ABNORMAL HIGH (ref 20.0–28.0)
Calcium, Ion: 1.19 mmol/L (ref 1.15–1.40)
HCT: 39 % (ref 39.0–52.0)
Hemoglobin: 13.3 g/dL (ref 13.0–17.0)
O2 Saturation: 100 %
Potassium: 2.9 mmol/L — ABNORMAL LOW (ref 3.5–5.1)
Sodium: 141 mmol/L (ref 135–145)
TCO2: 30 mmol/L (ref 22–32)
pCO2 arterial: 42.4 mmHg (ref 32.0–48.0)
pH, Arterial: 7.433 (ref 7.350–7.450)
pO2, Arterial: 434 mmHg — ABNORMAL HIGH (ref 83.0–108.0)

## 2018-10-21 LAB — I-STAT CHEM 8, ED
BUN: 11 mg/dL (ref 6–20)
Calcium, Ion: 1.14 mmol/L — ABNORMAL LOW (ref 1.15–1.40)
Chloride: 105 mmol/L (ref 98–111)
Creatinine, Ser: 0.8 mg/dL (ref 0.61–1.24)
Glucose, Bld: 89 mg/dL (ref 70–99)
HCT: 44 % (ref 39.0–52.0)
Hemoglobin: 15 g/dL (ref 13.0–17.0)
Potassium: 3.6 mmol/L (ref 3.5–5.1)
Sodium: 142 mmol/L (ref 135–145)
TCO2: 29 mmol/L (ref 22–32)

## 2018-10-21 LAB — PROTIME-INR
INR: 1 (ref 0.8–1.2)
Prothrombin Time: 13 seconds (ref 11.4–15.2)

## 2018-10-21 LAB — ETHANOL: Alcohol, Ethyl (B): <10 mg/dL (ref <10)

## 2018-10-21 LAB — CDS SEROLOGY

## 2018-10-21 LAB — LACTIC ACID, PLASMA: Lactic Acid, Venous: 0.9 mmol/L (ref 0.5–1.9)

## 2018-10-21 MED ORDER — ETOMIDATE 2 MG/ML IV SOLN
INTRAVENOUS | Status: AC | PRN
  Administered 2018-10-21: 20 mg via INTRAVENOUS

## 2018-10-21 MED ORDER — PROPOFOL 1000 MG/100ML IV EMUL
INTRAVENOUS | Status: AC
  Administered 2018-10-21: 20 ug/kg/min via INTRAVENOUS
  Filled 2018-10-21: qty 100

## 2018-10-21 MED ORDER — ROCURONIUM BROMIDE 50 MG/5ML IV SOLN
INTRAVENOUS | Status: AC | PRN
  Administered 2018-10-21: 80 mg via INTRAVENOUS

## 2018-10-21 MED ORDER — CHLORHEXIDINE GLUCONATE CLOTH 2 % EX PADS
6.0000 | MEDICATED_PAD | Freq: Every day | CUTANEOUS | Status: DC
  Administered 2018-10-21: 20:00:00 6 via TOPICAL

## 2018-10-21 MED ORDER — LEVETIRACETAM IN NACL 500 MG/100ML IV SOLN
500.0000 mg | Freq: Two times a day (BID) | INTRAVENOUS | Status: DC
  Administered 2018-10-22 – 2018-10-23 (×3): 500 mg via INTRAVENOUS
  Filled 2018-10-21 (×3): qty 100

## 2018-10-21 MED ORDER — PANTOPRAZOLE SODIUM 40 MG IV SOLR
40.0000 mg | Freq: Every day | INTRAVENOUS | Status: DC
  Administered 2018-10-22: 09:00:00 40 mg via INTRAVENOUS
  Filled 2018-10-21 (×2): qty 40

## 2018-10-21 MED ORDER — TETANUS-DIPHTH-ACELL PERTUSSIS 5-2.5-18.5 LF-MCG/0.5 IM SUSP
0.5000 mL | Freq: Once | INTRAMUSCULAR | Status: AC
  Administered 2018-10-21: 0.5 mL via INTRAMUSCULAR

## 2018-10-21 MED ORDER — PANTOPRAZOLE SODIUM 40 MG PO TBEC
40.0000 mg | DELAYED_RELEASE_TABLET | Freq: Every day | ORAL | Status: DC
  Administered 2018-10-23 – 2018-10-27 (×5): 40 mg via ORAL
  Filled 2018-10-21 (×5): qty 1

## 2018-10-21 MED ORDER — OXCARBAZEPINE 300 MG PO TABS
600.0000 mg | ORAL_TABLET | Freq: Every evening | ORAL | Status: DC
  Administered 2018-10-21: 23:00:00 600 mg via ORAL
  Filled 2018-10-21: qty 2

## 2018-10-21 MED ORDER — ONDANSETRON 4 MG PO TBDP: 4.0000 mg | ORAL_TABLET | Freq: Four times a day (QID) | ORAL | Status: DC | PRN

## 2018-10-21 MED ORDER — LEVETIRACETAM IN NACL 1000 MG/100ML IV SOLN
1000.0000 mg | INTRAVENOUS | Status: AC
  Administered 2018-10-21: 1000 mg via INTRAVENOUS
  Filled 2018-10-21: qty 100

## 2018-10-21 MED ORDER — SODIUM CHLORIDE 0.9 % IV SOLN
INTRAVENOUS | Status: DC
  Administered 2018-10-21 – 2018-10-23 (×4): via INTRAVENOUS

## 2018-10-21 MED ORDER — ORAL CARE MOUTH RINSE: 15.0000 mL | OROMUCOSAL | Status: DC

## 2018-10-21 MED ORDER — OXCARBAZEPINE 300 MG PO TABS: 600.0000 mg | ORAL_TABLET | Freq: Every evening | ORAL | Status: DC

## 2018-10-21 MED ORDER — CHLORHEXIDINE GLUCONATE 0.12% ORAL RINSE (MEDLINE KIT)
15.0000 mL | Freq: Two times a day (BID) | OROMUCOSAL | Status: DC
  Administered 2018-10-21 – 2018-10-22 (×2): 15 mL via OROMUCOSAL

## 2018-10-21 MED ORDER — OXCARBAZEPINE 150 MG PO TABS
150.0000 mg | ORAL_TABLET | Freq: Every morning | ORAL | Status: DC
  Filled 2018-10-21 (×2): qty 1

## 2018-10-21 MED ORDER — HYDRALAZINE HCL 20 MG/ML IJ SOLN
10.0000 mg | Freq: Once | INTRAMUSCULAR | Status: AC
  Administered 2018-10-21: 10 mg via INTRAVENOUS
  Filled 2018-10-21: qty 1

## 2018-10-21 MED ORDER — FENTANYL CITRATE (PF) 100 MCG/2ML IJ SOLN
25.0000 ug | INTRAMUSCULAR | Status: DC | PRN
  Administered 2018-10-21 – 2018-10-22 (×3): 50 ug via INTRAVENOUS
  Filled 2018-10-21 (×3): qty 2

## 2018-10-21 MED ORDER — CLEVIDIPINE BUTYRATE 0.5 MG/ML IV EMUL
0.0000 mg/h | INTRAVENOUS | Status: DC
  Administered 2018-10-21: 1 mg/h via INTRAVENOUS
  Filled 2018-10-21: qty 50

## 2018-10-21 MED ORDER — LABETALOL HCL 5 MG/ML IV SOLN
10.0000 mg | Freq: Once | INTRAVENOUS | Status: AC
  Administered 2018-10-21: 10 mg via INTRAVENOUS
  Filled 2018-10-21: qty 4

## 2018-10-21 MED ORDER — ONDANSETRON HCL 4 MG/2ML IJ SOLN
4.0000 mg | Freq: Four times a day (QID) | INTRAMUSCULAR | Status: DC | PRN
  Filled 2018-10-21: qty 2

## 2018-10-21 MED ORDER — CHLORHEXIDINE GLUCONATE 0.12% ORAL RINSE (MEDLINE KIT): 15.0000 mL | Freq: Two times a day (BID) | OROMUCOSAL | Status: DC

## 2018-10-21 MED ORDER — ORAL CARE MOUTH RINSE
15.0000 mL | OROMUCOSAL | Status: DC
  Administered 2018-10-21 – 2018-10-22 (×7): 15 mL via OROMUCOSAL

## 2018-10-21 MED ORDER — ESMOLOL HCL-SODIUM CHLORIDE 2000 MG/100ML IV SOLN: 25.0000 ug/kg/min | INTRAVENOUS | Status: DC

## 2018-10-21 MED ORDER — PROPOFOL 1000 MG/100ML IV EMUL
5.0000 ug/kg/min | INTRAVENOUS | Status: DC
  Administered 2018-10-21: 50 ug/kg/min via INTRAVENOUS
  Administered 2018-10-21: 16:00:00 20 ug/kg/min via INTRAVENOUS
  Administered 2018-10-22: 50 ug/kg/min via INTRAVENOUS
  Filled 2018-10-21 (×4): qty 100

## 2018-10-21 MED ORDER — IOHEXOL 300 MG/ML  SOLN
100.0000 mL | Freq: Once | INTRAMUSCULAR | Status: AC | PRN
  Administered 2018-10-21: 100 mL via INTRAVENOUS

## 2018-10-21 NOTE — Progress Notes (Signed)
Preliminary EEG review-negative for seizures.  Awaiting formal read.  -- Amie Portland, MD [facility] Pager: [phone] If 7pm to 7am, please call on call as listed on AMION.

## 2018-10-21 NOTE — Progress Notes (Signed)
Transported pt to CT scan and back without complications.  

## 2018-10-21 NOTE — ED Provider Notes (Signed)
[facility] EMERGENCY DEPARTMENT Provider Note   CSN: [id number] Arrival date & time: 10/21/18  1600     History   Chief Complaint Chief Complaint  Patient presents with   Fall    HPI David Dennis is a 53 y.o. male who presents to the emergency department as a level 1 trauma activation after being found down at the bottom of a flight of stairs with AMS and evidence of head trauma. EMS reports patient not following commands and non-verbal on their arrival. Family reports patient has history of seizures and heavy alcohol use. Patient was transported to the hospital for further evaluation. GCS of 8 on arrival. Family reports patient was at the bottom of approximately 15-20 stairs.   Found down The history is provided by the EMS personnel and a parent. The history is limited by the condition of the patient.    Past Medical History:  Diagnosis Date   Acute kidney failure ([place])    Cardiac arrest ([place])    Depression    Paranoid schizophrenia [facility])     Patient Active Problem List   Diagnosis Date Noted   SDH (subdural hematoma) ([place]) 10/21/2018    History reviewed. No pertinent surgical history.      Home Medications    Prior to Admission medications   Medication Sig Start Date End Date Taking? Authorizing Provider  busPIRone (BUSPAR) 10 MG tablet Take 10 mg by mouth 2 (two) times daily.   Yes [provider]  carvedilol (COREG) 25 MG tablet Take 25 mg by mouth 2 (two) times daily with a meal.   Yes [provider]  hydrOXYzine (ATARAX/VISTARIL) 25 MG tablet Take 25 mg by mouth 3 (three) times daily as needed for itching.   Yes [provider]  levETIRAcetam (KEPPRA) 500 MG tablet Take 500 mg by mouth 2 (two) times daily.   Yes [provider]  OLANZapine (ZYPREXA) 20 MG tablet Take 20 mg by mouth at bedtime.   Yes [provider]  OXcarbazepine (TRILEPTAL) 150 MG tablet Take 150 mg by mouth.   Yes  [provider]  sacubitril-valsartan (ENTRESTO) 24-26 MG Take 1 tablet by mouth 2 (two) times daily.   Yes [provider]  venlafaxine (EFFEXOR) 75 MG tablet Take 75 mg by mouth 3 (three) times daily with meals.   Yes [provider]    Family History No family history on file.  Social History Social History   Tobacco Use   Smoking status: Current Every Day Smoker   Smokeless tobacco: Never Used  Substance Use Topics   Alcohol use: Yes   Drug use: Yes     Allergies   Patient has no known allergies.   Review of Systems Review of Systems  Unable to perform ROS: Mental status change     Physical Exam Updated Vital Signs BP 116/88    Pulse 72    Temp 98.3 F (36.8 C) (Axillary)    Resp 16    Ht _0  (1.803 m)    Wt 93 kg    SpO2 100%    BMI 28.59 kg/m   Physical Exam Constitutional:      Appearance: He is well-developed.     Interventions: Cervical collar in place.  HENT:     Head: Normocephalic.     Right Ear: External ear normal.     Left Ear: External ear normal.     Nose: Nose normal.  Eyes:  Comments: Pupils 4 mm, equal and minimally reactive  Neck:     Comments: c-collar in place Cardiovascular:     Rate and Rhythm: Normal rate and regular rhythm.     Pulses: Normal pulses.  Pulmonary:     Effort: Pulmonary effort is normal. No respiratory distress.     Breath sounds: No stridor. No wheezing.  Abdominal:     General: There is no distension.     Palpations: Abdomen is soft.     Tenderness: There is no abdominal tenderness. There is no guarding.  Musculoskeletal:     Cervical back: He exhibits no deformity.     Thoracic back: He exhibits no deformity.     Lumbar back: He exhibits no deformity.     Right lower leg: No edema.     Left lower leg: No edema.  Skin:    General: Skin is warm and dry.     Findings: Abrasion (R clavicle) and ecchymosis (R upper eyelid, small hemostatic superficial skin tear on R eye lid)  present.  Neurological:     Mental Status: He is lethargic and disoriented.     GCS: GCS eye subscore is 1. GCS verbal subscore is 2. GCS motor subscore is 5.     Comments: Not following commands, spontaneously moving upper extremities       ED Treatments / Results  Labs (all labs ordered are listed, but only abnormal results are displayed) Labs Reviewed  CBC - Abnormal; Notable for the following components:      Result Value   WBC 10.7 (*)    RBC 4.16 (*)    All other components within normal limits  URINALYSIS, ROUTINE W REFLEX MICROSCOPIC - Abnormal; Notable for the following components:   Color, Urine COLORLESS (*)    All other components within normal limits  I-STAT CHEM 8, ED - Abnormal; Notable for the following components:   Calcium, Ion 1.14 (*)    All other components within normal limits  POCT I-STAT 7, (LYTES, BLD GAS, ICA,H+H) - Abnormal; Notable for the following components:   pO2, Arterial 434.0 (*)    Bicarbonate 28.3 (*)    Acid-Base Excess 4.0 (*)    Potassium 2.9 (*)    All other components within normal limits  SARS CORONAVIRUS 2 (TAT 6-24 HRS)  MRSA PCR SCREENING  CDS SEROLOGY  COMPREHENSIVE METABOLIC PANEL  ETHANOL  LACTIC ACID, PLASMA  PROTIME-INR  BLOOD GAS, ARTERIAL  HIV ANTIBODY (ROUTINE TESTING W REFLEX)  COMPREHENSIVE METABOLIC PANEL  CBC  I-STAT CHEM 8, ED  SAMPLE TO BLOOD BANK    EKG None  Radiology Ct Head Wo Contrast  Result Date: 10/21/2018 CLINICAL DATA:  Altered mental status.  Un witnessed fall. EXAM: CT HEAD WITHOUT CONTRAST CT CERVICAL SPINE WITHOUT CONTRAST TECHNIQUE: Multidetector CT imaging of the head and cervical spine was performed following the standard protocol without intravenous contrast. Multiplanar CT image reconstructions of the cervical spine were also generated. COMPARISON:  None. FINDINGS: CT HEAD FINDINGS Brain: Acute right subdural hematoma is identified overlying the right cerebral hemisphere. Right to left  midline shift by approximately 2-3 mm. A small right anterior para falcine subdural hematoma is also identified, image 26/1 measuring 2 mm. The ventricular volumes are normal. No intraventricular hemorrhage. Vascular: No hyperdense vessel or unexpected calcification. Skull: Normal. Negative for fracture or focal lesion. Sinuses/Orbits: No acute finding. Other: None. CT CERVICAL SPINE FINDINGS Alignment: Normal. Skull base and vertebrae: No acute fracture. No primary bone lesion or  focal pathologic process. Soft tissues and spinal canal: No prevertebral fluid or swelling. No visible canal hematoma. Disc levels: Disc spaces are well preserved. Mild multi level ventral and posterior endplate spurring. Upper chest: Negative. Other: None IMPRESSION: 1. Acute right subdural hematoma overlying the right cerebral hemisphere measuring approximately 6 mm in thickness. There is mild mass effect upon the underlying brain parenchyma with 2-3 mm of right to left midline shift. 2. No cervical spine fracture. 3. Critical Value/emergent results were called by telephone at the time of interpretation on 10/21/2018 at 5:24 pm to providerDr. Dema Severin, who verbally acknowledged these results. Electronically Signed   By: Kerby Moors M.D.   On: 10/21/2018 17:24   Ct Chest W Contrast  Result Date: 10/21/2018 CLINICAL DATA:  Found by family members face down at bottom of steps. Unresponsive. EXAM: CT CHEST, ABDOMEN, AND PELVIS WITH CONTRAST TECHNIQUE: Multidetector CT imaging of the chest, abdomen and pelvis was performed following the standard protocol during bolus administration of intravenous contrast. CONTRAST:  182m OMNIPAQUE IOHEXOL 300 MG/ML  SOLN COMPARISON:  None. FINDINGS: CT CHEST FINDINGS Cardiovascular: Normal heart size. Increase caliber of the ascending thoracic aorta Measures 4.2 cm, image 55/5. Mediastinum/Nodes: ET tube tip is above the carina. There is a nasogastric tube with tip just below the GE junction. Normal  appearance of the thyroid gland. No enlarged mediastinal or hilar lymph nodes. Lungs/Pleura: No pleural effusion. Bilateral lower lobe atelectasis and airspace consolidation noted right greater than left. Cannot rule out aspiration. No pneumothorax. Musculoskeletal: No chest wall mass or suspicious bone lesions identified. CT ABDOMEN PELVIS FINDINGS Hepatobiliary: No hepatic injury or perihepatic hematoma. Gallbladder is unremarkable Pancreas: Unremarkable. No pancreatic ductal dilatation or surrounding inflammatory changes. Spleen: No splenic injury or perisplenic hematoma. Adrenals/Urinary Tract: No adrenal hemorrhage or renal injury identified. Bladder is diffusely distended. Stomach/Bowel: Stomach is within normal limits. Appendix appears normal. No evidence of bowel wall thickening, distention, or inflammatory changes. Vascular/Lymphatic: No significant vascular findings are present. No enlarged abdominal or pelvic lymph nodes. Reproductive: Prostate is unremarkable. Other: No abdominal wall hernia or abnormality. No abdominopelvic ascites. Musculoskeletal: No acute or significant osseous findings. IMPRESSION: 1. Status post intubation. Bilateral lower lobe airspace opacities and atelectasis concerning for aspiration. No findings to suggest solid organ injury within the chest, abdomen or pelvis. No acute osseous abnormality. 2. Ascending thoracic aorta measures 4.2 cm. Recommend annual imaging followup by CTA or MRA. This recommendation follows 2010 ACCF/AHA/AATS/ACR/ASA/SCA/SCAI/SIR/STS/SVM Guidelines for the Diagnosis and Management of Patients with Thoracic Aortic Disease. Circulation. 2010; 121:: [id number] Aortic aneurysm NOS (ICD10-I71.9) Electronically Signed   By: TKerby MoorsM.D.   On: 10/21/2018 17:42   Ct Cervical Spine Wo Contrast  Result Date: 10/21/2018 CLINICAL DATA:  Altered mental status.  Un witnessed fall. EXAM: CT HEAD WITHOUT CONTRAST CT CERVICAL SPINE WITHOUT CONTRAST TECHNIQUE:  Multidetector CT imaging of the head and cervical spine was performed following the standard protocol without intravenous contrast. Multiplanar CT image reconstructions of the cervical spine were also generated. COMPARISON:  None. FINDINGS: CT HEAD FINDINGS Brain: Acute right subdural hematoma is identified overlying the right cerebral hemisphere. Right to left midline shift by approximately 2-3 mm. A small right anterior para falcine subdural hematoma is also identified, image 26/1 measuring 2 mm. The ventricular volumes are normal. No intraventricular hemorrhage. Vascular: No hyperdense vessel or unexpected calcification. Skull: Normal. Negative for fracture or focal lesion. Sinuses/Orbits: No acute finding. Other: None. CT CERVICAL SPINE FINDINGS Alignment: Normal. Skull base and vertebrae: No  acute fracture. No primary bone lesion or focal pathologic process. Soft tissues and spinal canal: No prevertebral fluid or swelling. No visible canal hematoma. Disc levels: Disc spaces are well preserved. Mild multi level ventral and posterior endplate spurring. Upper chest: Negative. Other: None IMPRESSION: 1. Acute right subdural hematoma overlying the right cerebral hemisphere measuring approximately 6 mm in thickness. There is mild mass effect upon the underlying brain parenchyma with 2-3 mm of right to left midline shift. 2. No cervical spine fracture. 3. Critical Value/emergent results were called by telephone at the time of interpretation on 10/21/2018 at 5:24 pm to providerDr. Dema Severin, who verbally acknowledged these results. Electronically Signed   By: Kerby Moors M.D.   On: 10/21/2018 17:24   Ct Abdomen Pelvis W Contrast  Result Date: 10/21/2018 CLINICAL DATA:  Found by family members face down at bottom of steps. Unresponsive. EXAM: CT CHEST, ABDOMEN, AND PELVIS WITH CONTRAST TECHNIQUE: Multidetector CT imaging of the chest, abdomen and pelvis was performed following the standard protocol during bolus  administration of intravenous contrast. CONTRAST:  148m OMNIPAQUE IOHEXOL 300 MG/ML  SOLN COMPARISON:  None. FINDINGS: CT CHEST FINDINGS Cardiovascular: Normal heart size. Increase caliber of the ascending thoracic aorta Measures 4.2 cm, image 55/5. Mediastinum/Nodes: ET tube tip is above the carina. There is a nasogastric tube with tip just below the GE junction. Normal appearance of the thyroid gland. No enlarged mediastinal or hilar lymph nodes. Lungs/Pleura: No pleural effusion. Bilateral lower lobe atelectasis and airspace consolidation noted right greater than left. Cannot rule out aspiration. No pneumothorax. Musculoskeletal: No chest wall mass or suspicious bone lesions identified. CT ABDOMEN PELVIS FINDINGS Hepatobiliary: No hepatic injury or perihepatic hematoma. Gallbladder is unremarkable Pancreas: Unremarkable. No pancreatic ductal dilatation or surrounding inflammatory changes. Spleen: No splenic injury or perisplenic hematoma. Adrenals/Urinary Tract: No adrenal hemorrhage or renal injury identified. Bladder is diffusely distended. Stomach/Bowel: Stomach is within normal limits. Appendix appears normal. No evidence of bowel wall thickening, distention, or inflammatory changes. Vascular/Lymphatic: No significant vascular findings are present. No enlarged abdominal or pelvic lymph nodes. Reproductive: Prostate is unremarkable. Other: No abdominal wall hernia or abnormality. No abdominopelvic ascites. Musculoskeletal: No acute or significant osseous findings. IMPRESSION: 1. Status post intubation. Bilateral lower lobe airspace opacities and atelectasis concerning for aspiration. No findings to suggest solid organ injury within the chest, abdomen or pelvis. No acute osseous abnormality. 2. Ascending thoracic aorta measures 4.2 cm. Recommend annual imaging followup by CTA or MRA. This recommendation follows 2010 ACCF/AHA/AATS/ACR/ASA/SCA/SCAI/SIR/STS/SVM Guidelines for the Diagnosis and Management of  Patients with Thoracic Aortic Disease. Circulation. 2010; 121:: [id number] Aortic aneurysm NOS (ICD10-I71.9) Electronically Signed   By: TKerby MoorsM.D.   On: 10/21/2018 17:42   Dg Pelvis Portable  Result Date: 10/21/2018 CLINICAL DATA:  Trauma level 1 fall. Pt found laying prone and unresponsive at the bottom of 15-20 stairs. trauma EXAM: PORTABLE PELVIS 1-2 VIEWS COMPARISON:  None. FINDINGS: There is no evidence of pelvic fracture or diastasis. No pelvic bone lesions are seen. IMPRESSION: No evidence of pelvic fracture or dislocation. Electronically Signed   By: SSuzy BouchardM.D.   On: 10/21/2018 16:45   Dg Chest Port 1 View  Result Date: 10/21/2018 CLINICAL DATA:  [age]year old male with history of level 1 trauma from a fall. EXAM: PORTABLE CHEST 1 VIEW COMPARISON:  Chest x-ray 04/03/2018. FINDINGS: An endotracheal tube is in place with tip 3.6 cm above the carina. Nasogastric tube with tip in the proximal stomach and side port in the  distal third of the esophagus shortly before the gastroesophageal junction. Lung volumes are low. No consolidative airspace disease. No pleural effusions. No pneumothorax. No pulmonary nodule or mass noted. Pulmonary vasculature and the cardiomediastinal silhouette are within normal limits. IMPRESSION: 1. Support apparatus, as above. 2. Low lung volumes without radiographic evidence of acute cardiopulmonary disease. Electronically Signed   By: Vinnie Langton M.D.   On: 10/21/2018 16:44    Procedures Procedure Name: Intubation Date/Time: 10/21/2018 4:21 PM Performed by: Betsey Amen, MD Pre-anesthesia Checklist: Patient identified, Suction available, Patient being monitored and Emergency Drugs available Oxygen Delivery Method: Non-rebreather mask Preoxygenation: Pre-oxygenation with 100% oxygen Induction Type: Rapid sequence Ventilation: Mask ventilation without difficulty Laryngoscope Size: Glidescope and 4 Grade View: Grade I Tube size: 7.5  mm Number of attempts: 1 Airway Equipment and Method: Video-laryngoscopy Placement Confirmation: ETT inserted through vocal cords under direct vision Secured at: 24 cm Tube secured with: ETT holder      (including critical care time)  Medications Ordered in ED Medications  propofol (DIPRIVAN) 1000 MG/100ML infusion (50 mcg/kg/min  93 kg Intravenous New Bag/Given 10/21/18 2236)  0.9 %  sodium chloride infusion ( Intravenous New Bag/Given 10/21/18 1709)  ondansetron (ZOFRAN-ODT) disintegrating tablet 4 mg (has no administration in time range)    Or  ondansetron (ZOFRAN) injection 4 mg (has no administration in time range)  pantoprazole (PROTONIX) EC tablet 40 mg (has no administration in time range)    Or  pantoprazole (PROTONIX) injection 40 mg (has no administration in time range)  fentaNYL (SUBLIMAZE) injection 25-50 mcg (50 mcg Intravenous Given 10/21/18 1826)  clevidipine (CLEVIPREX) infusion 0.5 mg/mL (0 mg/hr Intravenous Stopped 10/21/18 1850)  levETIRAcetam (KEPPRA) IVPB 500 mg/100 mL premix (has no administration in time range)  OXcarbazepine (TRILEPTAL) tablet 150 mg (has no administration in time range)  Chlorhexidine Gluconate Cloth 2 % PADS 6 each (6 each Topical Given 10/21/18 2005)  chlorhexidine gluconate (MEDLINE KIT) (PERIDEX) 0.12 % solution 15 mL (15 mLs Mouth Rinse Given 10/21/18 2005)  MEDLINE mouth rinse (15 mLs Mouth Rinse Given 10/22/18 0050)  Oxcarbazepine (TRILEPTAL) tablet 600 mg (has no administration in time range)  Tdap (BOOSTRIX) injection 0.5 mL (0.5 mLs Intramuscular Given 10/21/18 1708)  etomidate (AMIDATE) injection (20 mg Intravenous Given 10/21/18 1608)  rocuronium (ZEMURON) injection (80 mg Intravenous Given 10/21/18 1609)  labetalol (NORMODYNE) injection 10 mg (10 mg Intravenous Given 10/21/18 1708)  hydrALAZINE (APRESOLINE) injection 10 mg (10 mg Intravenous Given 10/21/18 1708)  levETIRAcetam (KEPPRA) IVPB 1000 mg/100 mL premix (0 mg Intravenous Stopped 10/21/18  1723)  iohexol (OMNIPAQUE) 300 MG/ML solution 100 mL (100 mLs Intravenous Contrast Given 10/21/18 1658)     Initial Impression / Assessment and Plan / ED Course  I have reviewed the triage vital signs and the nursing notes.  Pertinent labs & imaging results that were available during my care of the patient were reviewed by me and considered in my medical decision making (see chart for details).        Level 1 trauma activation. Trauma surgery at bedside prior to patient arrival. Hypertensive with GCS of 8 on arrival. Patient was intubated for airway protection, please see the above procedure note for further details. Full trauma scans were preformed, found to have right SDH. Trauma surgery to admit patient to ICU. Trauma surgery consulted Neurology and Neurosurgery during patient's time in the emergency department. Patient's mother updated in person and brought to bedside. Patient was admitted to trauma ICU for further evaluation and  management.  Patient seen and plan discussed with Dr. Tyrone Nine.  Final Clinical Impressions(s) / ED Diagnoses   Final diagnoses:  Endotracheally intubated    ED Discharge Orders    None       Betsey Amen, MD 10/22/18 [place], [place], DO 10/24/18 [phone]

## 2018-10-21 NOTE — Consult Note (Signed)
Reason for Consult: Subdural hematoma Referring Physician: Trauma  David Dennis is an 53 y.o. male.  HPI: Cognitively impaired 53 year old found unresponsive at home unclear mechanism patient is on a host of medications known seizure disorder work-up after being intubated and chemically sedated and paralyzed showed a small right convexity subdural hematoma still has no effacement of the sulci and cisterns are open  Past Medical History:  Diagnosis Date  . Acute kidney failure (HCC)   . Cardiac arrest (HCC)   . Depression   . Paranoid schizophrenia (HCC)     History reviewed. No pertinent surgical history.  No family history on file.  Social History:  reports that he has been smoking. He has never used smokeless tobacco. He reports current alcohol use. He reports current drug use.  Allergies: No Known Allergies  Medications: Prior to Admission: (Not in a hospital admission)   Results for orders placed or performed during the hospital encounter of 10/21/18 (from the past 48 hour(s))  CDS serology     Status: None   Collection Time: 10/21/18  4:05 PM  Result Value Ref Range   CDS serology specimen      SPECIMEN WILL BE HELD FOR 14 DAYS IF TESTING IS REQUIRED    Comment: SPECIMEN WILL BE HELD FOR 14 DAYS IF TESTING IS REQUIRED SPECIMEN WILL BE HELD FOR 14 DAYS IF TESTING IS REQUIRED Performed at [facility], [address] [place], [place] [address]   Comprehensive metabolic panel     Status: None   Collection Time: 10/21/18  4:05 PM  Result Value Ref Range   Sodium 140 135 - 145 mmol/L   Potassium 3.6 3.5 - 5.1 mmol/L   Chloride 105 98 - 111 mmol/L   CO2 24 22 - 32 mmol/L   Glucose, Bld 90 70 - 99 mg/dL   BUN 8 6 - 20 mg/dL   Creatinine, Ser 6.04 0.61 - 1.24 mg/dL   Calcium 9.3 8.9 - 54.0 mg/dL   Total Protein 7.5 6.5 - 8.1 g/dL   Albumin 4.4 3.5 - 5.0 g/dL   AST 28 15 - 41 U/L   ALT 17 0 - 44 U/L   Alkaline Phosphatase 83 38 - 126 U/L   Total  Bilirubin 0.5 0.3 - 1.2 mg/dL   GFR calc non Af Amer >60 >60 mL/min   GFR calc Af Amer >60 >60 mL/min   Anion gap 11 5 - 15    Comment: Performed at [facility], [address] [place], [place] [address]  CBC     Status: Abnormal   Collection Time: 10/21/18  4:05 PM  Result Value Ref Range   WBC 10.7 (H) 4.0 - 10.5 K/uL   RBC 4.16 (L) 4.22 - 5.81 MIL/uL   Hemoglobin 14.1 13.0 - 17.0 g/dL   HCT 14.7 82.9 - 56.2 %   MCV 97.8 80.0 - 100.0 fL   MCH 33.9 26.0 - 34.0 pg   MCHC 34.6 30.0 - 36.0 g/dL   RDW 13.0 86.5 - 78.4 %   Platelets 228 150 - 400 K/uL   nRBC 0.0 0.0 - 0.2 %    Comment: Performed at [facility], [address], [place], [place] [address]  Ethanol     Status: None   Collection Time: 10/21/18  4:05 PM  Result Value Ref Range   Alcohol, Ethyl (B) <10 <10 mg/dL    Comment: (NOTE) Lowest detectable limit for serum alcohol is  10 mg/dL. For medical purposes only. Performed at [facility], [address] [place], [place] [address]   Lactic acid, plasma     Status: None   Collection Time: 10/21/18  4:05 PM  Result Value Ref Range   Lactic Acid, Venous 0.9 0.5 - 1.9 mmol/L    Comment: Performed at [facility], [address] [place], [place] [address]  Protime-INR     Status: None   Collection Time: 10/21/18  4:05 PM  Result Value Ref Range   Prothrombin Time 13.0 11.4 - 15.2 seconds   INR 1.0 0.8 - 1.2    Comment: (NOTE) INR goal varies based on device and disease states. Performed at [facility], [address] [place], [place] [address]   Sample to Blood Bank     Status: None   Collection Time: 10/21/18  4:05 PM  Result Value Ref Range   Blood Bank Specimen SAMPLE AVAILABLE FOR TESTING    Sample Expiration      10/24/2018,2359 Performed at [facility], [address] [place], [place] [address]   I-stat chem 8, ED     Status: Abnormal   Collection Time: 10/21/18  4:12 PM  Result Value Ref Range   Sodium 142 135  - 145 mmol/L   Potassium 3.6 3.5 - 5.1 mmol/L   Chloride 105 98 - 111 mmol/L   BUN 11 6 - 20 mg/dL   Creatinine, Ser 0.860.80 0.61 - 1.24 mg/dL   Glucose, Bld 89 70 - 99 mg/dL   Calcium, Ion 5.781.14 (L) 1.15 - 1.40 mmol/L   TCO2 29 22 - 32 mmol/L   Hemoglobin 15.0 13.0 - 17.0 g/dL   HCT 46.944.0 62.939.0 - 52.852.0 %  I-STAT 7, (LYTES, BLD GAS, ICA, H+H)     Status: Abnormal   Collection Time: 10/21/18  4:53 PM  Result Value Ref Range   pH, Arterial 7.433 7.350 - 7.450   pCO2 arterial 42.4 32.0 - 48.0 mmHg   pO2, Arterial 434.0 (H) 83.0 - 108.0 mmHg   Bicarbonate 28.3 (H) 20.0 - 28.0 mmol/L   TCO2 30 22 - 32 mmol/L   O2 Saturation 100.0 %   Acid-Base Excess 4.0 (H) 0.0 - 2.0 mmol/L   Sodium 141 135 - 145 mmol/L   Potassium 2.9 (L) 3.5 - 5.1 mmol/L   Calcium, Ion 1.19 1.15 - 1.40 mmol/L   HCT 39.0 39.0 - 52.0 %   Hemoglobin 13.3 13.0 - 17.0 g/dL   Patient temperature HIDE    Collection site RADIAL, ALLEN'S TEST ACCEPTABLE    Drawn by RT    Sample type ARTERIAL   Urinalysis, Routine w reflex microscopic     Status: Abnormal   Collection Time: 10/21/18  5:51 PM  Result Value Ref Range   Color, Urine COLORLESS (A) YELLOW   APPearance CLEAR CLEAR   Specific Gravity, Urine 1.011 1.005 - 1.030   pH 7.0 5.0 - 8.0   Glucose, UA NEGATIVE NEGATIVE mg/dL   Hgb urine dipstick NEGATIVE NEGATIVE   Bilirubin Urine NEGATIVE NEGATIVE   Ketones, ur NEGATIVE NEGATIVE mg/dL   Protein, ur NEGATIVE NEGATIVE mg/dL   Nitrite NEGATIVE NEGATIVE   Leukocytes,Ua NEGATIVE NEGATIVE    Comment: Performed at [facility], [address] [place], [place] [address]    Ct Head Wo Contrast  Result Date: 10/21/2018 CLINICAL DATA:  Altered mental status.  Un witnessed fall. EXAM: CT HEAD WITHOUT CONTRAST CT CERVICAL  SPINE WITHOUT CONTRAST TECHNIQUE: Multidetector CT imaging of the head and cervical spine was performed following the standard protocol without intravenous contrast. Multiplanar CT image reconstructions of  the cervical spine were also generated. COMPARISON:  None. FINDINGS: CT HEAD FINDINGS Brain: Acute right subdural hematoma is identified overlying the right cerebral hemisphere. Right to left midline shift by approximately 2-3 mm. A small right anterior para falcine subdural hematoma is also identified, image 26/1 measuring 2 mm. The ventricular volumes are normal. No intraventricular hemorrhage. Vascular: No hyperdense vessel or unexpected calcification. Skull: Normal. Negative for fracture or focal lesion. Sinuses/Orbits: No acute finding. Other: None. CT CERVICAL SPINE FINDINGS Alignment: Normal. Skull base and vertebrae: No acute fracture. No primary bone lesion or focal pathologic process. Soft tissues and spinal canal: No prevertebral fluid or swelling. No visible canal hematoma. Disc levels: Disc spaces are well preserved. Mild multi level ventral and posterior endplate spurring. Upper chest: Negative. Other: None IMPRESSION: 1. Acute right subdural hematoma overlying the right cerebral hemisphere measuring approximately 6 mm in thickness. There is mild mass effect upon the underlying brain parenchyma with 2-3 mm of right to left midline shift. 2. No cervical spine fracture. 3. Critical Value/emergent results were called by telephone at the time of interpretation on 10/21/2018 at 5:24 pm to providerDr. Dema Severin, who verbally acknowledged these results. Electronically Signed   By: Kerby Moors M.D.   On: 10/21/2018 17:24   Ct Chest W Contrast  Result Date: 10/21/2018 CLINICAL DATA:  Found by family members face down at bottom of steps. Unresponsive. EXAM: CT CHEST, ABDOMEN, AND PELVIS WITH CONTRAST TECHNIQUE: Multidetector CT imaging of the chest, abdomen and pelvis was performed following the standard protocol during bolus administration of intravenous contrast. CONTRAST:  165mL OMNIPAQUE IOHEXOL 300 MG/ML  SOLN COMPARISON:  None. FINDINGS: CT CHEST FINDINGS Cardiovascular: Normal heart size. Increase caliber  of the ascending thoracic aorta Measures 4.2 cm, image 55/5. Mediastinum/Nodes: ET tube tip is above the carina. There is a nasogastric tube with tip just below the GE junction. Normal appearance of the thyroid gland. No enlarged mediastinal or hilar lymph nodes. Lungs/Pleura: No pleural effusion. Bilateral lower lobe atelectasis and airspace consolidation noted right greater than left. Cannot rule out aspiration. No pneumothorax. Musculoskeletal: No chest wall mass or suspicious bone lesions identified. CT ABDOMEN PELVIS FINDINGS Hepatobiliary: No hepatic injury or perihepatic hematoma. Gallbladder is unremarkable Pancreas: Unremarkable. No pancreatic ductal dilatation or surrounding inflammatory changes. Spleen: No splenic injury or perisplenic hematoma. Adrenals/Urinary Tract: No adrenal hemorrhage or renal injury identified. Bladder is diffusely distended. Stomach/Bowel: Stomach is within normal limits. Appendix appears normal. No evidence of bowel wall thickening, distention, or inflammatory changes. Vascular/Lymphatic: No significant vascular findings are present. No enlarged abdominal or pelvic lymph nodes. Reproductive: Prostate is unremarkable. Other: No abdominal wall hernia or abnormality. No abdominopelvic ascites. Musculoskeletal: No acute or significant osseous findings. IMPRESSION: 1. Status post intubation. Bilateral lower lobe airspace opacities and atelectasis concerning for aspiration. No findings to suggest solid organ injury within the chest, abdomen or pelvis. No acute osseous abnormality. 2. Ascending thoracic aorta measures 4.2 cm. Recommend annual imaging followup by CTA or MRA. This recommendation follows 2010 ACCF/AHA/AATS/ACR/ASA/SCA/SCAI/SIR/STS/SVM Guidelines for the Diagnosis and Management of Patients with Thoracic Aortic Disease. Circulation. 2010; 121: Q734-L937. Aortic aneurysm NOS (ICD10-I71.9) Electronically Signed   By: Kerby Moors M.D.   On: 10/21/2018 17:42   Ct Cervical  Spine Wo Contrast  Result Date: 10/21/2018 CLINICAL DATA:  Altered mental status.  Un witnessed  fall. EXAM: CT HEAD WITHOUT CONTRAST CT CERVICAL SPINE WITHOUT CONTRAST TECHNIQUE: Multidetector CT imaging of the head and cervical spine was performed following the standard protocol without intravenous contrast. Multiplanar CT image reconstructions of the cervical spine were also generated. COMPARISON:  None. FINDINGS: CT HEAD FINDINGS Brain: Acute right subdural hematoma is identified overlying the right cerebral hemisphere. Right to left midline shift by approximately 2-3 mm. A small right anterior para falcine subdural hematoma is also identified, image 26/1 measuring 2 mm. The ventricular volumes are normal. No intraventricular hemorrhage. Vascular: No hyperdense vessel or unexpected calcification. Skull: Normal. Negative for fracture or focal lesion. Sinuses/Orbits: No acute finding. Other: None. CT CERVICAL SPINE FINDINGS Alignment: Normal. Skull base and vertebrae: No acute fracture. No primary bone lesion or focal pathologic process. Soft tissues and spinal canal: No prevertebral fluid or swelling. No visible canal hematoma. Disc levels: Disc spaces are well preserved. Mild multi level ventral and posterior endplate spurring. Upper chest: Negative. Other: None IMPRESSION: 1. Acute right subdural hematoma overlying the right cerebral hemisphere measuring approximately 6 mm in thickness. There is mild mass effect upon the underlying brain parenchyma with 2-3 mm of right to left midline shift. 2. No cervical spine fracture. 3. Critical Value/emergent results were called by telephone at the time of interpretation on 10/21/2018 at 5:24 pm to providerDr. Cliffton Asters, who verbally acknowledged these results. Electronically Signed   By: Signa Kell M.D.   On: 10/21/2018 17:24   Ct Abdomen Pelvis W Contrast  Result Date: 10/21/2018 CLINICAL DATA:  Found by family members face down at bottom of steps. Unresponsive. EXAM:  CT CHEST, ABDOMEN, AND PELVIS WITH CONTRAST TECHNIQUE: Multidetector CT imaging of the chest, abdomen and pelvis was performed following the standard protocol during bolus administration of intravenous contrast. CONTRAST:  OMNIPAQUE IOHEXOL 300 MG/ML  SOLN COMPARISON:  None. FINDINGS: CT CHEST FINDINGS Cardiovascular: Normal heart size. Increase caliber of the ascending thoracic aorta Measures 4.2 cm, image 55/5. Mediastinum/Nodes: ET tube tip is above the carina. There is a nasogastric tube with tip just below the GE junction. Normal appearance of the thyroid gland. No enlarged mediastinal or hilar lymph nodes. Lungs/Pleura: No pleural effusion. Bilateral lower lobe atelectasis and airspace consolidation noted right greater than left. Cannot rule out aspiration. No pneumothorax. Musculoskeletal: No chest wall mass or suspicious bone lesions identified. CT ABDOMEN PELVIS FINDINGS Hepatobiliary: No hepatic injury or perihepatic hematoma. Gallbladder is unremarkable Pancreas: Unremarkable. No pancreatic ductal dilatation or surrounding inflammatory changes. Spleen: No splenic injury or perisplenic hematoma. Adrenals/Urinary Tract: No adrenal hemorrhage or renal injury identified. Bladder is diffusely distended. Stomach/Bowel: Stomach is within normal limits. Appendix appears normal. No evidence of bowel wall thickening, distention, or inflammatory changes. Vascular/Lymphatic: No significant vascular findings are present. No enlarged abdominal or pelvic lymph nodes. Reproductive: Prostate is unremarkable. Other: No abdominal wall hernia or abnormality. No abdominopelvic ascites. Musculoskeletal: No acute or significant osseous findings. IMPRESSION: 1. Status post intubation. Bilateral lower lobe airspace opacities and atelectasis concerning for aspiration. No findings to suggest solid organ injury within the chest, abdomen or pelvis. No acute osseous abnormality. 2. Ascending thoracic aorta measures 4.2 cm.  Recommend annual imaging followup by CTA or MRA. This recommendation follows 2010 ACCF/AHA/AATS/ACR/ASA/SCA/SCAI/SIR/STS/SVM Guidelines for the Diagnosis and Management of Patients with Thoracic Aortic Disease. Circulation. 2010; 121: [id number]. Aortic aneurysm NOS (ICD10-I71.9) Electronically Signed   By: Signa Kell M.D.   On: 10/21/2018 17:42   Dg Pelvis Portable  Result Date: 10/21/2018 CLINICAL DATA:  Trauma level 1 fall. Pt found laying prone and unresponsive at the bottom of 15-20 stairs. trauma EXAM: PORTABLE PELVIS 1-2 VIEWS COMPARISON:  None. FINDINGS: There is no evidence of pelvic fracture or diastasis. No pelvic bone lesions are seen. IMPRESSION: No evidence of pelvic fracture or dislocation. Electronically Signed   By: Genevive BiStewart  Edmunds M.D.   On: 10/21/2018 16:45   Dg Chest Port 1 View  Result Date: 10/21/2018 CLINICAL DATA:  53 year old male with history of level 1 trauma from a fall. EXAM: PORTABLE CHEST 1 VIEW COMPARISON:  Chest x-ray 04/03/2018. FINDINGS: An endotracheal tube is in place with tip 3.6 cm above the carina. Nasogastric tube with tip in the proximal stomach and side port in the distal third of the esophagus shortly before the gastroesophageal junction. Lung volumes are low. No consolidative airspace disease. No pleural effusions. No pneumothorax. No pulmonary nodule or mass noted. Pulmonary vasculature and the cardiomediastinal silhouette are within normal limits. IMPRESSION: 1. Support apparatus, as above. 2. Low lung volumes without radiographic evidence of acute cardiopulmonary disease. Electronically Signed   By: Trudie Reedaniel  Entrikin M.D.   On: 10/21/2018 16:44    Review of Systems  Unable to perform ROS: Critical illness   Blood pressure 120/84, pulse 89, temperature (!) 97.1 F (36.2 C), temperature source Oral, resp. rate 18, height 5\' 11"  (1.803 m), weight 93 kg, SpO2 96 %. Physical Exam  Neurological: He is unresponsive. GCS eye subscore is 1. GCS verbal subscore  is 1. GCS motor subscore is 1.  Chemically sedated and paralyzed and more than likely postictal    Assessment/Plan: 53 year old small 7 mm subdural with minimal mass-effect certainly does not explain altered mental status more than likely seizure disorder.  Nonoperative management at this point  Jailyne Chieffo P 10/21/2018, 7:01 PM

## 2018-10-21 NOTE — H&P (Signed)
Activation and Reason: Level 1, possible fall down stairs - 15 - 20 steps  Primary Survey:  Airway: Intact, not talking but moving air without effort Breathing: Bilateral BS Circulation: Palpable pulses in all 4 ext Disability: GCS 8 (E1V2M5)  David Dennis is an 53 y.o. male.  HPI: [id number] with presumed history of CHF, epilepsy, EtOH abuse, depression/anxiety/psychiatric whom presented as level 1 - was found down at bottom of stairs by his parents per EMS. He was noted to have GCS 8 en route. On arrival, GCS 8; would not follow commands or lie still; combative; he was therefore intubated by ED team. History limited due to this.  He comes with bag of medications collected by EMS - including Entresto, Venlafaxine, hydroxyzine, olanzapine, keppra  Past Medical History:  Diagnosis Date   Acute kidney failure (HCC)    Cardiac arrest (HCC)    Depression    Paranoid schizophrenia (HCC)     History reviewed. No pertinent surgical history.  No family history on file.  Social History:  reports that he has been smoking. He has never used smokeless tobacco. He reports current alcohol use. He reports current drug use.  Allergies: No Known Allergies  Medications: I have reviewed the patient's current medications.  Results for orders placed or performed during the hospital encounter of 10/21/18 (from the past 48 hour(s))  CDS serology     Status: None   Collection Time: 10/21/18  4:05 PM  Result Value Ref Range   CDS serology specimen      SPECIMEN WILL BE HELD FOR 14 DAYS IF TESTING IS REQUIRED    Comment: SPECIMEN WILL BE HELD FOR 14 DAYS IF TESTING IS REQUIRED SPECIMEN WILL BE HELD FOR 14 DAYS IF TESTING IS REQUIRED Performed at [facility], [address] [place], [place] [address]   Comprehensive metabolic panel     Status: None   Collection Time: 10/21/18  4:05 PM  Result Value Ref Range   Sodium 140 135 - 145 mmol/L   Potassium 3.6 3.5 - 5.1 mmol/L   Chloride 105 98 - 111 mmol/L   CO2 24 22 - 32 mmol/L   Glucose, Bld 90 70 - 99 mg/dL   BUN 8 6 - 20 mg/dL   Creatinine, Ser 6.040.83 0.61 - 1.24 mg/dL   Calcium 9.3 8.9 - 54.010.3 mg/dL   Total Protein 7.5 6.5 - 8.1 g/dL   Albumin 4.4 3.5 - 5.0 g/dL   AST 28 15 - 41 U/L   ALT 17 0 - 44 U/L   Alkaline Phosphatase 83 38 - 126 U/L   Total Bilirubin 0.5 0.3 - 1.2 mg/dL   GFR calc non Af Amer >60 >60 mL/min   GFR calc Af Amer >60 >60 mL/min   Anion gap 11 5 - 15    Comment: Performed at [facility], [address] [place], [place] [address]  CBC     Status: Abnormal   Collection Time: 10/21/18  4:05 PM  Result Value Ref Range   WBC 10.7 (H) 4.0 - 10.5 K/uL   RBC 4.16 (L) 4.22 - 5.81 MIL/uL   Hemoglobin 14.1 13.0 - 17.0 g/dL   HCT 14.740.7 82.939.0 - 56.252.0 %   MCV 97.8 80.0 - 100.0 fL   MCH 33.9 26.0 - 34.0 pg   MCHC 34.6 30.0 - 36.0 g/dL   RDW 13.014.5 86.511.5 - 78.415.5 %   Platelets 228 150 - 400 K/uL   nRBC  0.0 0.0 - 0.2 %    Comment: Performed at [facility], [address] [place], [place] [address]  Ethanol     Status: None   Collection Time: 10/21/18  4:05 PM  Result Value Ref Range   Alcohol, Ethyl (B) <10 <10 mg/dL    Comment: (NOTE) Lowest detectable limit for serum alcohol is 10 mg/dL. For medical purposes only. Performed at [facility], [address] [place], [place] [address]   Lactic acid, plasma     Status: None   Collection Time: 10/21/18  4:05 PM  Result Value Ref Range   Lactic Acid, Venous 0.9 0.5 - 1.9 mmol/L    Comment: Performed at [facility], [address], [place], [place] [address]  Protime-INR     Status: None   Collection Time: 10/21/18  4:05 PM  Result Value Ref Range   Prothrombin Time 13.0 11.4 - 15.2 seconds   INR 1.0 0.8 - 1.2    Comment: (NOTE) INR goal varies based on device and disease states. Performed at [facility], [address], [place], [place] [address]   Sample to Blood Bank     Status: None   Collection Time:  10/21/18  4:05 PM  Result Value Ref Range   Blood Bank Specimen SAMPLE AVAILABLE FOR TESTING    Sample Expiration      10/24/2018,2359 Performed at [facility], [address], [place], [place] [address]   I-stat chem 8, ED     Status: Abnormal   Collection Time: 10/21/18  4:12 PM  Result Value Ref Range   Sodium 142 135 - 145 mmol/L   Potassium 3.6 3.5 - 5.1 mmol/L   Chloride 105 98 - 111 mmol/L   BUN 11 6 - 20 mg/dL   Creatinine, Ser 1.97 0.61 - 1.24 mg/dL   Glucose, Bld 89 70 - 99 mg/dL   Calcium, Ion 5.88 (L) 1.15 - 1.40 mmol/L   TCO2 29 22 - 32 mmol/L   Hemoglobin 15.0 13.0 - 17.0 g/dL   HCT 32.5 49.8 - 26.4 %  I-STAT 7, (LYTES, BLD GAS, ICA, H+H)     Status: Abnormal   Collection Time: 10/21/18  4:53 PM  Result Value Ref Range   pH, Arterial 7.433 7.350 - 7.450   pCO2 arterial 42.4 32.0 - 48.0 mmHg   pO2, Arterial 434.0 (H) 83.0 - 108.0 mmHg   Bicarbonate 28.3 (H) 20.0 - 28.0 mmol/L   TCO2 30 22 - 32 mmol/L   O2 Saturation 100.0 %   Acid-Base Excess 4.0 (H) 0.0 - 2.0 mmol/L   Sodium 141 135 - 145 mmol/L   Potassium 2.9 (L) 3.5 - 5.1 mmol/L   Calcium, Ion 1.19 1.15 - 1.40 mmol/L   HCT 39.0 39.0 - 52.0 %   Hemoglobin 13.3 13.0 - 17.0 g/dL   Patient temperature HIDE    Collection site RADIAL, ALLEN'S TEST ACCEPTABLE    Drawn by RT    Sample type ARTERIAL     Ct Head Wo Contrast  Result Date: 10/21/2018 CLINICAL DATA:  Altered mental status.  Un witnessed fall. EXAM: CT HEAD WITHOUT CONTRAST CT CERVICAL SPINE WITHOUT CONTRAST TECHNIQUE: Multidetector CT imaging of the head and cervical spine was performed following the standard protocol without intravenous contrast. Multiplanar CT image reconstructions of the cervical spine were also generated. COMPARISON:  None. FINDINGS: CT HEAD FINDINGS Brain: Acute right subdural hematoma is identified overlying the right cerebral  hemisphere. Right to left midline shift by approximately 2-3 mm. A small right anterior para  falcine subdural hematoma is also identified, image 26/1 measuring 2 mm. The ventricular volumes are normal. No intraventricular hemorrhage. Vascular: No hyperdense vessel or unexpected calcification. Skull: Normal. Negative for fracture or focal lesion. Sinuses/Orbits: No acute finding. Other: None. CT CERVICAL SPINE FINDINGS Alignment: Normal. Skull base and vertebrae: No acute fracture. No primary bone lesion or focal pathologic process. Soft tissues and spinal canal: No prevertebral fluid or swelling. No visible canal hematoma. Disc levels: Disc spaces are well preserved. Mild multi level ventral and posterior endplate spurring. Upper chest: Negative. Other: None IMPRESSION: 1. Acute right subdural hematoma overlying the right cerebral hemisphere measuring approximately 6 mm in thickness. There is mild mass effect upon the underlying brain parenchyma with 2-3 mm of right to left midline shift. 2. No cervical spine fracture. 3. Critical Value/emergent results were called by telephone at the time of interpretation on 10/21/2018 at 5:24 pm to providerDr. Cliffton AstersWhite, who verbally acknowledged these results. Electronically Signed   By: Signa Kellaylor  Stroud M.D.   On: 10/21/2018 17:24   Ct Chest W Contrast  Result Date: 10/21/2018 CLINICAL DATA:  Found by family members face down at bottom of steps. Unresponsive. EXAM: CT CHEST, ABDOMEN, AND PELVIS WITH CONTRAST TECHNIQUE: Multidetector CT imaging of the chest, abdomen and pelvis was performed following the standard protocol during bolus administration of intravenous contrast. CONTRAST:  100mL OMNIPAQUE IOHEXOL 300 MG/ML  SOLN COMPARISON:  None. FINDINGS: CT CHEST FINDINGS Cardiovascular: Normal heart size. Increase caliber of the ascending thoracic aorta Measures 4.2 cm, image 55/5. Mediastinum/Nodes: ET tube tip is above the carina. There is a nasogastric tube with tip just below the GE junction. Normal appearance of the thyroid gland. No enlarged mediastinal or hilar lymph  nodes. Lungs/Pleura: No pleural effusion. Bilateral lower lobe atelectasis and airspace consolidation noted right greater than left. Cannot rule out aspiration. No pneumothorax. Musculoskeletal: No chest wall mass or suspicious bone lesions identified. CT ABDOMEN PELVIS FINDINGS Hepatobiliary: No hepatic injury or perihepatic hematoma. Gallbladder is unremarkable Pancreas: Unremarkable. No pancreatic ductal dilatation or surrounding inflammatory changes. Spleen: No splenic injury or perisplenic hematoma. Adrenals/Urinary Tract: No adrenal hemorrhage or renal injury identified. Bladder is diffusely distended. Stomach/Bowel: Stomach is within normal limits. Appendix appears normal. No evidence of bowel wall thickening, distention, or inflammatory changes. Vascular/Lymphatic: No significant vascular findings are present. No enlarged abdominal or pelvic lymph nodes. Reproductive: Prostate is unremarkable. Other: No abdominal wall hernia or abnormality. No abdominopelvic ascites. Musculoskeletal: No acute or significant osseous findings. IMPRESSION: 1. Status post intubation. Bilateral lower lobe airspace opacities and atelectasis concerning for aspiration. No findings to suggest solid organ injury within the chest, abdomen or pelvis. No acute osseous abnormality. 2. Ascending thoracic aorta measures 4.2 cm. Recommend annual imaging followup by CTA or MRA. This recommendation follows 2010 ACCF/AHA/AATS/ACR/ASA/SCA/SCAI/SIR/STS/SVM Guidelines for the Diagnosis and Management of Patients with Thoracic Aortic Disease. Circulation. 2010; 121: Z610-R604: E266-e369. Aortic aneurysm NOS (ICD10-I71.9) Electronically Signed   By: Signa Kellaylor  Stroud M.D.   On: 10/21/2018 17:42   Ct Cervical Spine Wo Contrast  Result Date: 10/21/2018 CLINICAL DATA:  Altered mental status.  Un witnessed fall. EXAM: CT HEAD WITHOUT CONTRAST CT CERVICAL SPINE WITHOUT CONTRAST TECHNIQUE: Multidetector CT imaging of the head and cervical spine was performed  following the standard protocol without intravenous contrast. Multiplanar CT image reconstructions of the cervical spine were also generated. COMPARISON:  None. FINDINGS: CT HEAD FINDINGS Brain: Acute right  subdural hematoma is identified overlying the right cerebral hemisphere. Right to left midline shift by approximately 2-3 mm. A small right anterior para falcine subdural hematoma is also identified, image 26/1 measuring 2 mm. The ventricular volumes are normal. No intraventricular hemorrhage. Vascular: No hyperdense vessel or unexpected calcification. Skull: Normal. Negative for fracture or focal lesion. Sinuses/Orbits: No acute finding. Other: None. CT CERVICAL SPINE FINDINGS Alignment: Normal. Skull base and vertebrae: No acute fracture. No primary bone lesion or focal pathologic process. Soft tissues and spinal canal: No prevertebral fluid or swelling. No visible canal hematoma. Disc levels: Disc spaces are well preserved. Mild multi level ventral and posterior endplate spurring. Upper chest: Negative. Other: None IMPRESSION: 1. Acute right subdural hematoma overlying the right cerebral hemisphere measuring approximately 6 mm in thickness. There is mild mass effect upon the underlying brain parenchyma with 2-3 mm of right to left midline shift. 2. No cervical spine fracture. 3. Critical Value/emergent results were called by telephone at the time of interpretation on 10/21/2018 at 5:24 pm to providerDr. Dema Severin, who verbally acknowledged these results. Electronically Signed   By: Kerby Moors M.D.   On: 10/21/2018 17:24   Ct Abdomen Pelvis W Contrast  Result Date: 10/21/2018 CLINICAL DATA:  Found by family members face down at bottom of steps. Unresponsive. EXAM: CT CHEST, ABDOMEN, AND PELVIS WITH CONTRAST TECHNIQUE: Multidetector CT imaging of the chest, abdomen and pelvis was performed following the standard protocol during bolus administration of intravenous contrast. CONTRAST:  186mL OMNIPAQUE IOHEXOL 300  MG/ML  SOLN COMPARISON:  None. FINDINGS: CT CHEST FINDINGS Cardiovascular: Normal heart size. Increase caliber of the ascending thoracic aorta Measures 4.2 cm, image 55/5. Mediastinum/Nodes: ET tube tip is above the carina. There is a nasogastric tube with tip just below the GE junction. Normal appearance of the thyroid gland. No enlarged mediastinal or hilar lymph nodes. Lungs/Pleura: No pleural effusion. Bilateral lower lobe atelectasis and airspace consolidation noted right greater than left. Cannot rule out aspiration. No pneumothorax. Musculoskeletal: No chest wall mass or suspicious bone lesions identified. CT ABDOMEN PELVIS FINDINGS Hepatobiliary: No hepatic injury or perihepatic hematoma. Gallbladder is unremarkable Pancreas: Unremarkable. No pancreatic ductal dilatation or surrounding inflammatory changes. Spleen: No splenic injury or perisplenic hematoma. Adrenals/Urinary Tract: No adrenal hemorrhage or renal injury identified. Bladder is diffusely distended. Stomach/Bowel: Stomach is within normal limits. Appendix appears normal. No evidence of bowel wall thickening, distention, or inflammatory changes. Vascular/Lymphatic: No significant vascular findings are present. No enlarged abdominal or pelvic lymph nodes. Reproductive: Prostate is unremarkable. Other: No abdominal wall hernia or abnormality. No abdominopelvic ascites. Musculoskeletal: No acute or significant osseous findings. IMPRESSION: 1. Status post intubation. Bilateral lower lobe airspace opacities and atelectasis concerning for aspiration. No findings to suggest solid organ injury within the chest, abdomen or pelvis. No acute osseous abnormality. 2. Ascending thoracic aorta measures 4.2 cm. Recommend annual imaging followup by CTA or MRA. This recommendation follows 2010 ACCF/AHA/AATS/ACR/ASA/SCA/SCAI/SIR/STS/SVM Guidelines for the Diagnosis and Management of Patients with Thoracic Aortic Disease. Circulation. 2010; 121: B353-G992. Aortic  aneurysm NOS (ICD10-I71.9) Electronically Signed   By: Kerby Moors M.D.   On: 10/21/2018 17:42   Dg Pelvis Portable  Result Date: 10/21/2018 CLINICAL DATA:  Trauma level 1 fall. Pt found laying prone and unresponsive at the bottom of 15-20 stairs. trauma EXAM: PORTABLE PELVIS 1-2 VIEWS COMPARISON:  None. FINDINGS: There is no evidence of pelvic fracture or diastasis. No pelvic bone lesions are seen. IMPRESSION: No evidence of pelvic fracture or dislocation. Electronically Signed  By: Genevive Bi M.D.   On: 10/21/2018 16:45   Dg Chest Port 1 View  Result Date: 10/21/2018 CLINICAL DATA:  53 year old male with history of level 1 trauma from a fall. EXAM: PORTABLE CHEST 1 VIEW COMPARISON:  Chest x-ray 04/03/2018. FINDINGS: An endotracheal tube is in place with tip 3.6 cm above the carina. Nasogastric tube with tip in the proximal stomach and side port in the distal third of the esophagus shortly before the gastroesophageal junction. Lung volumes are low. No consolidative airspace disease. No pleural effusions. No pneumothorax. No pulmonary nodule or mass noted. Pulmonary vasculature and the cardiomediastinal silhouette are within normal limits. IMPRESSION: 1. Support apparatus, as above. 2. Low lung volumes without radiographic evidence of acute cardiopulmonary disease. Electronically Signed   By: Trudie Reed M.D.   On: 10/21/2018 16:44    Review of Systems  Unable to perform ROS: Acuity of condition   Blood pressure (!) 167/108, pulse 77, temperature (!) 97.1 F (36.2 C), temperature source Oral, resp. rate 18, height 5\' 11"  (1.803 m), weight 93 kg, SpO2 97 %. Physical Exam  Constitutional: He appears well-developed and well-nourished.  HENT:  Head: Normocephalic.  Right Ear: External ear normal.  Left Ear: External ear normal.  Nose: Nose normal.  Mouth/Throat: Oropharynx is clear and moist.  Right upper lid with ecchymoses and superficial skin knick; not laceration. No active  bleeding  Eyes: Pupils are equal, round, and reactive to light. Conjunctivae and EOM are normal.  Neck: Neck supple. No tracheal deviation present.  Cardiovascular: Normal rate, regular rhythm, normal heart sounds and intact distal pulses.  Respiratory: Effort normal and breath sounds normal. No respiratory distress.  GI: Soft. He exhibits no distension. There is no guarding.  Musculoskeletal:        General: No deformity or edema.  Neurological: Coordination normal.  GCS 8 (E1V2M5)  Skin: Skin is warm and dry.    INJURIES IDENTIFIED: 1. Right subdural hematoma with 2-3 mm midline shift 2. Ecchymoses to right orbit 3. Possible aspiration based on CT 4. Possible seizure preceding fall 5. Incidentally found 4.2 cm thoracic aortic aneurysm   PLAN: -Admit to ICU -Neurosurgery - Dr. Wynetta Emery - repeat head CT in AM; SDH relatively small and does not appear to be the cause of his depressed GCS -Neurology consult - Dr. Wilford Corner - assisting in management which we appreciate; planning EEG tonight; starting him on multiple blood pressure medications given SDH, including clevidipine drip -His family has been updated - he lives at home with his parents; receives his neurology care through [place] [facility] in [place]. Not clear if suicidal intent either.  -Will need outpatient follow-up with cardiothoracic surgery for aneurysm evaluation and surveillance  Stephanie Coup. Cliffton Asters, M.D. [facility] 10/21/2018, 6:06 PM

## 2018-10-21 NOTE — Progress Notes (Signed)
EEG complete - results pending 

## 2018-10-21 NOTE — ED Notes (Signed)
Successful intubation by MD Tyrone Nine and MD Joylene Draft. +color change, equal blt breath sounds.

## 2018-10-21 NOTE — ED Triage Notes (Signed)
Pt BIB [facility] for eval of altered mental status following and unwitnessed fall down approx 15-20 steps. Pt was found by family member face down at the bottom of the steps unresponsive. EMS initially believed him to be post-ictal d/t hx of seizures, however pt remained altered w/ a GCS of 8. Arrives GCS of 8-9, tubed shortly after arrival. Hematoma and small lac to R forehead, NRB in place maintaining airway and satting well on NRB. No other obvious trauma. Hx of ETOH abuse, unknown last drink

## 2018-10-21 NOTE — Consult Note (Signed)
Neurology Consultation  Reason for Consult: Seizure Referring Physician: Annye English MD  CC: Fall, altered mental status  History is obtained from: Chart review, patient's father over the phone who did not have much in terms of information.  [facility] and [facility] the [facility].  Also history obtained from chart review from another record medical record #[id number] which is his chart but somehow he has another MRN created for this admission.  HPI: David Dennis is a 53 y.o. male clinical history of seizures, schizophrenia, prior suicide attempts with most recent resumed in March 2020, currently on Keppra for his seizures, presented to the emergency room after he fell 15-20 steps-brought in as a level 2 trauma.  Very agitated on arrival, GCS 8 and had to be intubated for airway protection.  Neurology was called because it was presumed that the fall was possibly secondary to the seizure according to report from the EMTs. I called the father, he knew the patient has seizures and is on a bunch of different medications for his psychiatric illnesses but could not tell me the names of medications.  The EMTs brought the medications with them.  The only antiepileptics in the bag were oxcarbazepine 150 mg qAM and 600 qPM, Keppra-500 twice daily. Patient unable to provide any history at this time Neurology consult for management of seizures Patient gets care at the [facility] records available from the [place] system at this time. Parents unclear if there was any suicidal intent this time.  LKW: Unclear tpa given?: no, subdural hematoma Premorbid modified Rankin scale (mRS): Unable to get reliable history at this time  ROS: Unable to obtain due to altered mental status.   No past medical history on file. Patient unable to provide any past history.  Obtained from chart review  No family history on file. Patient unable to provide due to his mentation.  Social  History:   has no history on file for tobacco, alcohol, and drug. Patient unable to provide at this time due to his mentation.  Per chart review positive for alcohol abuse  Medications  Current Facility-Administered Medications:  .  0.9 %  sodium chloride infusion, , Intravenous, Continuous, Rayburn, Kelly A, PA-C .  chlorhexidine gluconate (MEDLINE KIT) (PERIDEX) 0.12 % solution 15 mL, 15 mL, Mouth Rinse, BID, Rayburn, Kelly A, PA-C .  clevidipine (CLEVIPREX) infusion 0.5 mg/mL, 0-21 mg/hr, Intravenous, Continuous, Amie Portland, MD .  fentaNYL (SUBLIMAZE) injection 25-50 mcg, 25-50 mcg, Intravenous, Q2H PRN, Rayburn, Kelly A, PA-C .  hydrALAZINE (APRESOLINE) injection 10 mg, 10 mg, Intravenous, Once, Amie Portland, MD .  labetalol (NORMODYNE) injection 10 mg, 10 mg, Intravenous, Once, Amie Portland, MD .  levETIRAcetam (KEPPRA) IVPB 1000 mg/100 mL premix, 1,000 mg, Intravenous, STAT, Amie Portland, MD .  MEDLINE mouth rinse, 15 mL, Mouth Rinse, 10 times per day, Rayburn, Kelly A, PA-C .  ondansetron (ZOFRAN-ODT) disintegrating tablet 4 mg, 4 mg, Oral, Q6H PRN **OR** ondansetron (ZOFRAN) injection 4 mg, 4 mg, Intravenous, Q6H PRN, Rayburn, Kelly A, PA-C .  pantoprazole (PROTONIX) EC tablet 40 mg, 40 mg, Oral, Daily **OR** pantoprazole (PROTONIX) injection 40 mg, 40 mg, Intravenous, Daily, Rayburn, Kelly A, PA-C .  propofol (DIPRIVAN) 1000 MG/100ML infusion, 5-80 mcg/kg/min, Intravenous, Continuous, Deno Etienne, DO, Last Rate: 16.74 mL/hr at 10/21/18 1644, 30 mcg/kg/min at 10/21/18 1644 .  Tdap (BOOSTRIX) injection 0.5 mL, 0.5 mL, Intramuscular, Once, Deno Etienne, DO  Current Outpatient Medications:  .  busPIRone (  BUSPAR) 10 MG tablet, Take 10 mg by mouth 2 (two) times daily., Disp: , Rfl:  .  carvedilol (COREG) 25 MG tablet, Take 25 mg by mouth 2 (two) times daily with a meal., Disp: , Rfl:  .  hydrOXYzine (ATARAX/VISTARIL) 25 MG tablet, Take 25 mg by mouth 3 (three) times daily as needed for  itching., Disp: , Rfl:  .  levETIRAcetam (KEPPRA) 500 MG tablet, Take 500 mg by mouth 2 (two) times daily., Disp: , Rfl:  .  OLANZapine (ZYPREXA) 20 MG tablet, Take 20 mg by mouth at bedtime., Disp: , Rfl:  .  OXcarbazepine (TRILEPTAL) 150 MG tablet, Take 150 mg by mouth., Disp: , Rfl:  .  sacubitril-valsartan (ENTRESTO) 24-26 MG, Take 1 tablet by mouth 2 (two) times daily., Disp: , Rfl:  .  venlafaxine (EFFEXOR) 75 MG tablet, Take 75 mg by mouth 3 (three) times daily with meals., Disp: , Rfl:   Exam: Current vital signs: BP (!) 177/133   Pulse 80   Temp (!) 97.1 F (36.2 C) (Oral)   Resp 14   Ht 5' 11"  (1.803 m)   Wt 93 kg   SpO2 100%   BMI 28.59 kg/m  Vital signs in last 24 hours: Temp:  [97.1 F (36.2 C)] 97.1 F (36.2 C) (09/09 1602) Pulse Rate:  [80-104] 80 (09/09 1630) Resp:  [14-25] 14 (09/09 1630) BP: (174-225)/(120-166) 177/133 (09/09 1630) SpO2:  [98 %-100 %] 100 % (09/09 1630) FiO2 (%):  [100 %] 100 % (09/09 1612) Weight:  [93 kg] 93 kg (09/09 1613) General: Examined after RSI, no distress HEENT: Right upper lid skin tear, ecchymosis no active bleeding. CVS: Regular rate rhythm Respiratory: Vented GI: Nondistended nontender Extremities: Warm well perfused Neurological exam Sedated intubated on propofol, recently given paralytics for RSI Breathing with the ventilator Pupils are bilaterally equal and sluggishly reactive to light Present corneals Absent doll's eye No spontaneous movements initially but as medicines wears off, he had spontaneous bilateral upper extremity movement when trying to reach the tube. Prior to intubation, was told that he was moving all 4 extremities. On noxious stimulation on initial exam no movement.  Later on some withdrawal in the upper extremities.  Both lower extremities are mildly rigid and extended. Reflexes mute.  Toes mute.  Labs I have reviewed labs in epic and the results pertinent to this consultation are:  CBC     Component Value Date/Time   WBC 10.7 (H) 10/21/2018 1605   RBC 4.16 (L) 10/21/2018 1605   HGB 15.0 10/21/2018 1612   HCT 44.0 10/21/2018 1612   PLT 228 10/21/2018 1605   MCV 97.8 10/21/2018 1605   MCH 33.9 10/21/2018 1605   MCHC 34.6 10/21/2018 1605   RDW 14.5 10/21/2018 1605    CMP     Component Value Date/Time   NA 142 10/21/2018 1612   K 3.6 10/21/2018 1612   CL 105 10/21/2018 1612   CO2 24 10/21/2018 1605   GLUCOSE 89 10/21/2018 1612   BUN 11 10/21/2018 1612   CREATININE 0.80 10/21/2018 1612   CALCIUM 9.3 10/21/2018 1605   PROT 7.5 10/21/2018 1605   ALBUMIN 4.4 10/21/2018 1605   AST 28 10/21/2018 1605   ALT 17 10/21/2018 1605   ALKPHOS 83 10/21/2018 1605   BILITOT 0.5 10/21/2018 1605   GFRNONAA >60 10/21/2018 1605   GFRAA >60 10/21/2018 1605    Imaging I have reviewed the images obtained:  CT-scan of the brain final read pending-right-sided 1 millimeters  subdural hematoma CT C-spine, abdomen and pelvis pending formal read.  Assessment: 53 year old man with extensive schizophrenia, anxiety, depression, prior suicidal attempts, history of seizures on oxcarbazepine and Keppra, presenting as a level 2 trauma after having a fall from 15-20 steps on a staircase.  Per EMT report prior presumed seizure prior to fall. In the ED remained encephalopathic and agitated with a GCS of 8 requiring emergent intubation. Noted to have a right-sided 1 mm subdural hematoma. Impression: Seizure. Possible Status Epilepticus Subdural Hematoma Hypertensive emergency.  Recommendations: --Load Keppra 1g IV now. --BP<140 - use Labetalol or hydralazine PRN once and follow with Cleviprex. Use Propofol for sedation, will help with BP and if underlying seizure. --Sz precautions --Stat EEG --Continue Keppra 500 twice daily --If the OG tube is then, continue oxcarbazepine 150 qAM and 600 qPM --If the stat EEG shows any electrographic abnormality, might consider doing LTM EEG.  Will await  reading of the stat EEG for now. --Check urinary toxicology screen.  Also check if there is any family member can get more information about if he took excessive medication than prescribed.  Discussed the plan with Dr. Dema Severin.  Neurology will follow  -- Amie Portland, MD [facility] Pager: [phone] If 7pm to 7am, please call on call as listed on AMION.  CRITICAL CARE ATTESTATION Performed by: Amie Portland, MD Total critical care time: 45 minutes Critical care time was exclusive of separately billable procedures and treating other patients and/or supervising APPs/Residents/Students Critical care was necessary to treat or prevent imminent or life-threatening deterioration due to seizure, possible status epilepticus, subdural hematoma, hypertensive emergency. This patient is critically ill and at significant risk for neurological worsening and/or death and care requires constant monitoring. Critical care was time spent personally by me on the following activities: development of treatment plan with patient and/or surrogate as well as nursing, discussions with consultants, evaluation of patient's response to treatment, examination of patient, obtaining history from patient or surrogate, ordering and performing treatments and interventions, ordering and review of laboratory studies, ordering and review of radiographic studies, pulse oximetry, re-evaluation of patient's condition, participation in multidisciplinary rounds and medical decision making of high complexity in the care of this patient.

## 2018-10-21 NOTE — Progress Notes (Signed)
Orthopedic Tech Progress Note Patient Details:  SAAD BUHL III 24-Jun-1965 [id number] LEVEL 1 TRAUMA Patient ID: Georgeanne Nim III, male   DOB: March 29, 1965, 53 y.o.   MRN: [id number]   Janit Pagan 10/21/2018, 5:18 PM

## 2018-10-22 ENCOUNTER — Inpatient Hospital Stay (HOSPITAL_COMMUNITY): Payer: No Typology Code available for payment source

## 2018-10-22 DIAGNOSIS — F339 Major depressive disorder, recurrent, unspecified: Secondary | ICD-10-CM

## 2018-10-22 DIAGNOSIS — Z915 Personal history of self-harm: Secondary | ICD-10-CM

## 2018-10-22 DIAGNOSIS — T1491XA Suicide attempt, initial encounter: Secondary | ICD-10-CM

## 2018-10-22 DIAGNOSIS — T50992A Poisoning by other drugs, medicaments and biological substances, intentional self-harm, initial encounter: Secondary | ICD-10-CM

## 2018-10-22 LAB — COMPREHENSIVE METABOLIC PANEL
ALT: 15 U/L (ref 0–44)
AST: 19 U/L (ref 15–41)
Albumin: 3.7 g/dL (ref 3.5–5.0)
Alkaline Phosphatase: 69 U/L (ref 38–126)
Anion gap: 11 (ref 5–15)
BUN: 12 mg/dL (ref 6–20)
CO2: 22 mmol/L (ref 22–32)
Calcium: 8.9 mg/dL (ref 8.9–10.3)
Chloride: 108 mmol/L (ref 98–111)
Creatinine, Ser: 1.18 mg/dL (ref 0.61–1.24)
GFR calc Af Amer: >60 mL/min (ref >60)
GFR calc non Af Amer: >60 mL/min (ref >60)
Glucose, Bld: 84 mg/dL (ref 70–99)
Potassium: 3 mmol/L — ABNORMAL LOW (ref 3.5–5.1)
Sodium: 141 mmol/L (ref 135–145)
Total Bilirubin: 0.5 mg/dL (ref 0.3–1.2)
Total Protein: 6.3 g/dL — ABNORMAL LOW (ref 6.5–8.1)

## 2018-10-22 LAB — CBC
HCT: 35.8 % — ABNORMAL LOW (ref 39.0–52.0)
Hemoglobin: 12.4 g/dL — ABNORMAL LOW (ref 13.0–17.0)
MCH: 33.4 pg (ref 26.0–34.0)
MCHC: 34.6 g/dL (ref 30.0–36.0)
MCV: 96.5 fL (ref 80.0–100.0)
Platelets: 190 10*3/uL (ref 150–400)
RBC: 3.71 MIL/uL — ABNORMAL LOW (ref 4.22–5.81)
RDW: 14.9 % (ref 11.5–15.5)
WBC: 8.6 10*3/uL (ref 4.0–10.5)
nRBC: 0 % (ref 0.0–0.2)

## 2018-10-22 LAB — HIV ANTIBODY (ROUTINE TESTING W REFLEX): HIV Screen 4th Generation wRfx: NONREACTIVE

## 2018-10-22 LAB — SAMPLE TO BLOOD BANK

## 2018-10-22 LAB — MRSA PCR SCREENING: MRSA by PCR: NEGATIVE

## 2018-10-22 MED ORDER — OXCARBAZEPINE 300 MG/5ML PO SUSP
600.0000 mg | Freq: Every day | ORAL | Status: DC
  Filled 2018-10-22: qty 10

## 2018-10-22 MED ORDER — OXCARBAZEPINE 300 MG/5ML PO SUSP: 150.0000 mg | Freq: Every day | ORAL | Status: DC

## 2018-10-22 MED ORDER — DEXMEDETOMIDINE HCL IN NACL 200 MCG/50ML IV SOLN
0.4000 ug/kg/h | INTRAVENOUS | Status: DC
  Administered 2018-10-22: 1 ug/kg/h via INTRAVENOUS
  Administered 2018-10-22: 09:00:00 0.4 ug/kg/h via INTRAVENOUS
  Administered 2018-10-22: 0.8 ug/kg/h via INTRAVENOUS
  Administered 2018-10-22 – 2018-10-23 (×2): 0.2 ug/kg/h via INTRAVENOUS
  Filled 2018-10-22 (×4): qty 50

## 2018-10-22 MED ORDER — OXCARBAZEPINE 150 MG PO TABS
150.0000 mg | ORAL_TABLET | Freq: Every morning | ORAL | Status: DC
  Administered 2018-10-22: 10:00:00 150 mg

## 2018-10-22 MED ORDER — POTASSIUM CHLORIDE 10 MEQ/100ML IV SOLN
10.0000 meq | INTRAVENOUS | Status: AC
  Administered 2018-10-22 (×4): 10 meq via INTRAVENOUS
  Filled 2018-10-22 (×4): qty 100

## 2018-10-22 MED ORDER — OXYCODONE HCL 5 MG PO TABS
5.0000 mg | ORAL_TABLET | ORAL | Status: DC | PRN
  Administered 2018-10-23 – 2018-10-25 (×2): 5 mg via ORAL
  Filled 2018-10-22 (×2): qty 1

## 2018-10-22 MED ORDER — PNEUMOCOCCAL VAC POLYVALENT 25 MCG/0.5ML IJ INJ
0.5000 mL | INJECTION | INTRAMUSCULAR | Status: AC
  Administered 2018-10-23: 0.5 mL via INTRAMUSCULAR
  Filled 2018-10-22: qty 0.5

## 2018-10-22 MED ORDER — INFLUENZA VAC SPLIT QUAD 0.5 ML IM SUSY
0.5000 mL | PREFILLED_SYRINGE | INTRAMUSCULAR | Status: AC
  Administered 2018-10-23: 10:00:00 0.5 mL via INTRAMUSCULAR
  Filled 2018-10-22: qty 0.5

## 2018-10-22 MED ORDER — OXCARBAZEPINE 300 MG PO TABS
600.0000 mg | ORAL_TABLET | Freq: Every evening | ORAL | Status: DC
  Administered 2018-10-22 – 2018-10-27 (×6): 600 mg via ORAL
  Filled 2018-10-22 (×7): qty 2

## 2018-10-22 MED ORDER — OXCARBAZEPINE 150 MG PO TABS
150.0000 mg | ORAL_TABLET | Freq: Every morning | ORAL | Status: DC
  Administered 2018-10-23 – 2018-10-27 (×5): 150 mg via ORAL
  Filled 2018-10-22 (×5): qty 1

## 2018-10-22 MED ORDER — ACETAMINOPHEN 500 MG PO TABS
500.0000 mg | ORAL_TABLET | Freq: Four times a day (QID) | ORAL | Status: DC | PRN
  Administered 2018-10-25: 500 mg via ORAL
  Filled 2018-10-22 (×2): qty 1

## 2018-10-22 NOTE — Procedures (Signed)
Extubation Procedure Note  Patient Details:   Name: AZURE BARRALES III DOB: [birth year] MRN: [id number]   Airway Documentation:    Vent end date: 10/22/18 Vent end time: 1039   Evaluation  O2 sats: stable throughout Complications: No apparent complications Patient did tolerate procedure well. Bilateral Breath Sounds: Clear  Patient able to speak:  Yes  Rudene Re 10/22/2018, 10:40 AM

## 2018-10-22 NOTE — Progress Notes (Signed)
Patient ID: David ElkWilliam J Dejarnett Dennis, male   DOB: 04-Jun-1965, 53 y.o.   MRN: [id number] Follow up - Trauma Critical Care  Patient Details:    David Dennis is an 53 y.o. male.  Lines/tubes : Airway 7.5 mm (Active)  Secured at (cm) 26 cm 10/22/18 0719  Measured From Lips 10/22/18 0719  Secured Location Right 10/22/18 0719  Secured By [employer] Tube Holder 10/22/18 0719  Tube Holder Repositioned Yes 10/22/18 0719  Cuff Pressure (cm H2O) 20 cm H2O 10/22/18 0719  Site Condition Dry 10/22/18 0719     NG/OG Tube Orogastric 16 Fr. Center mouth Aucultation;Xray Documented cm marking at nare/ corner of mouth (Active)  Site Assessment Clean;Intact;Dry 10/22/18 0800  Ongoing Placement Verification No change in cm markings or external length of tube from initial placement;No change in respiratory status;No acute changes, not attributed to clinical condition;Auscultation 10/22/18 0800  Status Clamped 10/22/18 0800     Urethral Catheter A OLEARY RN Latex;Straight-tip 16 Fr. (Active)  Indication for Insertion or Continuance of Catheter Unstable critically ill patients first 24-48 hours (See Criteria) 10/22/18 0756  Site Assessment Clean;Intact 10/22/18 0756  Catheter Maintenance Bag below level of bladder;Catheter secured;Drainage bag/tubing not touching floor;Insertion date on drainage bag;No dependent loops;Seal intact 10/22/18 0756  Collection Container Standard drainage bag 10/22/18 0756  Securement Method Securing device (Describe) 10/22/18 0756  Urinary Catheter Interventions (if applicable) Unclamped 10/22/18 0756  Output (mL) 115 mL 10/22/18 0800    Microbiology/Sepsis markers: Results for orders placed or performed during the hospital encounter of 10/21/18  SARS CORONAVIRUS 2 (TAT 6-24 HRS) Nasopharyngeal Nasopharyngeal Swab     Status: None   Collection Time: 10/21/18  5:03 PM   Specimen: Nasopharyngeal Swab  Result Value Ref Range Status   SARS Coronavirus 2 NEGATIVE NEGATIVE  Final    Comment: (NOTE) SARS-CoV-2 target nucleic acids are NOT DETECTED. The SARS-CoV-2 RNA is generally detectable in upper and lower respiratory specimens during the acute phase of infection. Negative results do not preclude SARS-CoV-2 infection, do not rule out co-infections with other pathogens, and should not be used as the sole basis for treatment or other patient management decisions. Negative results must be combined with clinical observations, patient history, and epidemiological information. The expected result is Negative. Fact Sheet for Patients: HairSlick.nohttps://www.fda.gov/media/138098/download Fact Sheet for Healthcare Providers: quierodirigir.comhttps://www.fda.gov/media/138095/download This test is not yet approved or cleared by the [place] FDA and  has been authorized for detection and/or diagnosis of SARS-CoV-2 by FDA under an Emergency Use Authorization (EUA). This EUA will remain  in effect (meaning this test can be used) for the duration of the COVID-19 declaration under Section 56 4(b)(1) of the Act, 21 U.S.C. section 360bbb-3(b)(1), unless the authorization is terminated or revoked sooner. Performed at [facility], [address] [place], [place] [address]   MRSA PCR Screening     Status: None   Collection Time: 10/21/18  7:50 PM   Specimen: Nasal Mucosa; Nasopharyngeal  Result Value Ref Range Status   MRSA by PCR NEGATIVE NEGATIVE Final    Comment:        The GeneXpert MRSA Assay (FDA approved for NASAL specimens only), is one component of a comprehensive MRSA colonization surveillance program. It is not intended to diagnose MRSA infection nor to guide or monitor treatment for MRSA infections. Performed at [facility], [address] [place], [place] [address]     Anti-infectives:  Anti-infectives (From admission, onward)   None  Best Practice/Protocols:  VTE Prophylaxis: Mechanical Continous Sedation  Consults: Treatment Team:  Md,  Trauma, MD Kary Kos, MD    Studies:    Events:  Subjective:    Overnight Issues:   Objective:  Vital signs for last 24 hours: Temp:  [97.1 F (36.2 C)-98.3 F (36.8 C)] 97.8 F (36.6 C) (09/10 0400) Pulse Rate:  [61-104] 70 (09/10 0800) Resp:  [0-25] 17 (09/10 0800) BP: (85-225)/(55-166) 149/103 (09/10 0800) SpO2:  [93 %-100 %] 100 % (09/10 0800) FiO2 (%):  [40 %-100 %] 40 % (09/10 0800) Weight:  [93 kg] 93 kg (09/09 1613)  Hemodynamic parameters for last 24 hours:    Intake/Output from previous day: 09/09 0701 - 09/10 0700 In: 2157.2 [I.V.:1957.2; IV Piggyback:200] Out: 1650 [Urine:1650]  Intake/Output this shift: Total I/O In: 118.1 [I.V.:118.1] Out: 115 [Urine:115]  Vent settings for last 24 hours: Vent Mode: PRVC FiO2 (%):  [40 %-100 %] 40 % Set Rate:  [16 bmp] 16 bmp Vt Set:  [600 mL] 600 mL PEEP:  [5 cmH20] 5 cmH20 Plateau Pressure:  [13 cmH20-14 cmH20] 13 cmH20  Physical Exam:  General: no respiratory distress Neuro: alert and F/C HEENT/Neck: ETT Resp: clear to auscultation bilaterally CVS: regular rate and rhythm, S1, S2 normal, no murmur, click, rub or gallop GI: soft, nontender, BS WNL, no r/g Extremities: no edema, no erythema, pulses WNL  Results for orders placed or performed during the hospital encounter of 10/21/18 (from the past 24 hour(s))  CDS serology     Status: None   Collection Time: 10/21/18  4:05 PM  Result Value Ref Range   CDS serology specimen      SPECIMEN WILL BE HELD FOR 14 DAYS IF TESTING IS REQUIRED  Comprehensive metabolic panel     Status: None   Collection Time: 10/21/18  4:05 PM  Result Value Ref Range   Sodium 140 135 - 145 mmol/L   Potassium 3.6 3.5 - 5.1 mmol/L   Chloride 105 98 - 111 mmol/L   CO2 24 22 - 32 mmol/L   Glucose, Bld 90 70 - 99 mg/dL   BUN 8 6 - 20 mg/dL   Creatinine, Ser 0.83 0.61 - 1.24 mg/dL   Calcium 9.3 8.9 - 10.3 mg/dL   Total Protein 7.5 6.5 - 8.1 g/dL   Albumin 4.4 3.5 - 5.0 g/dL    AST 28 15 - 41 U/L   ALT 17 0 - 44 U/L   Alkaline Phosphatase 83 38 - 126 U/L   Total Bilirubin 0.5 0.3 - 1.2 mg/dL   GFR calc non Af Amer >60 >60 mL/min   GFR calc Af Amer >60 >60 mL/min   Anion gap 11 5 - 15  CBC     Status: Abnormal   Collection Time: 10/21/18  4:05 PM  Result Value Ref Range   WBC 10.7 (H) 4.0 - 10.5 K/uL   RBC 4.16 (L) 4.22 - 5.81 MIL/uL   Hemoglobin 14.1 13.0 - 17.0 g/dL   HCT 40.7 39.0 - 52.0 %   MCV 97.8 80.0 - 100.0 fL   MCH 33.9 26.0 - 34.0 pg   MCHC 34.6 30.0 - 36.0 g/dL   RDW 14.5 11.5 - 15.5 %   Platelets 228 150 - 400 K/uL   nRBC 0.0 0.0 - 0.2 %  Ethanol     Status: None   Collection Time: 10/21/18  4:05 PM  Result Value Ref Range   Alcohol, Ethyl (B) <10 <10 mg/dL  Lactic  acid, plasma     Status: None   Collection Time: 10/21/18  4:05 PM  Result Value Ref Range   Lactic Acid, Venous 0.9 0.5 - 1.9 mmol/L  Protime-INR     Status: None   Collection Time: 10/21/18  4:05 PM  Result Value Ref Range   Prothrombin Time 13.0 11.4 - 15.2 seconds   INR 1.0 0.8 - 1.2  Sample to Blood Bank     Status: None   Collection Time: 10/21/18  4:05 PM  Result Value Ref Range   Blood Bank Specimen SAMPLE AVAILABLE FOR TESTING    Sample Expiration      10/24/2018,2359 Performed at [facility], [address], [place], [place] [address]   I-stat chem 8, ED     Status: Abnormal   Collection Time: 10/21/18  4:12 PM  Result Value Ref Range   Sodium 142 135 - 145 mmol/L   Potassium 3.6 3.5 - 5.1 mmol/L   Chloride 105 98 - 111 mmol/L   BUN 11 6 - 20 mg/dL   Creatinine, Ser 1.31 0.61 - 1.24 mg/dL   Glucose, Bld 89 70 - 99 mg/dL   Calcium, Ion 4.38 (L) 1.15 - 1.40 mmol/L   TCO2 29 22 - 32 mmol/L   Hemoglobin 15.0 13.0 - 17.0 g/dL   HCT 88.7 57.9 - 72.8 %  I-STAT 7, (LYTES, BLD GAS, ICA, H+H)     Status: Abnormal   Collection Time: 10/21/18  4:53 PM  Result Value Ref Range   pH, Arterial 7.433 7.350 - 7.450   pCO2 arterial 42.4 32.0 - 48.0 mmHg   pO2,  Arterial 434.0 (H) 83.0 - 108.0 mmHg   Bicarbonate 28.3 (H) 20.0 - 28.0 mmol/L   TCO2 30 22 - 32 mmol/L   O2 Saturation 100.0 %   Acid-Base Excess 4.0 (H) 0.0 - 2.0 mmol/L   Sodium 141 135 - 145 mmol/L   Potassium 2.9 (L) 3.5 - 5.1 mmol/L   Calcium, Ion 1.19 1.15 - 1.40 mmol/L   HCT 39.0 39.0 - 52.0 %   Hemoglobin 13.3 13.0 - 17.0 g/dL   Patient temperature HIDE    Collection site RADIAL, ALLEN'S TEST ACCEPTABLE    Drawn by RT    Sample type ARTERIAL   SARS CORONAVIRUS 2 (TAT 6-24 HRS) Nasopharyngeal Nasopharyngeal Swab     Status: None   Collection Time: 10/21/18  5:03 PM   Specimen: Nasopharyngeal Swab  Result Value Ref Range   SARS Coronavirus 2 NEGATIVE NEGATIVE  Urinalysis, Routine w reflex microscopic     Status: Abnormal   Collection Time: 10/21/18  5:51 PM  Result Value Ref Range   Color, Urine COLORLESS (A) YELLOW   APPearance CLEAR CLEAR   Specific Gravity, Urine 1.011 1.005 - 1.030   pH 7.0 5.0 - 8.0   Glucose, UA NEGATIVE NEGATIVE mg/dL   Hgb urine dipstick NEGATIVE NEGATIVE   Bilirubin Urine NEGATIVE NEGATIVE   Ketones, ur NEGATIVE NEGATIVE mg/dL   Protein, ur NEGATIVE NEGATIVE mg/dL   Nitrite NEGATIVE NEGATIVE   Leukocytes,Ua NEGATIVE NEGATIVE  MRSA PCR Screening     Status: None   Collection Time: 10/21/18  7:50 PM   Specimen: Nasal Mucosa; Nasopharyngeal  Result Value Ref Range   MRSA by PCR NEGATIVE NEGATIVE  Comprehensive metabolic panel     Status: Abnormal   Collection Time: 10/22/18  5:21 AM  Result Value Ref Range   Sodium 141 135 - 145 mmol/L   Potassium 3.0 (  L) 3.5 - 5.1 mmol/L   Chloride 108 98 - 111 mmol/L   CO2 22 22 - 32 mmol/L   Glucose, Bld 84 70 - 99 mg/dL   BUN 12 6 - 20 mg/dL   Creatinine, Ser 4.541.18 0.61 - 1.24 mg/dL   Calcium 8.9 8.9 - 09.810.3 mg/dL   Total Protein 6.3 (L) 6.5 - 8.1 g/dL   Albumin 3.7 3.5 - 5.0 g/dL   AST 19 15 - 41 U/L   ALT 15 0 - 44 U/L   Alkaline Phosphatase 69 38 - 126 U/L   Total Bilirubin 0.5 0.3 - 1.2 mg/dL    GFR calc non Af Amer >60 >60 mL/min   GFR calc Af Amer >60 >60 mL/min   Anion gap 11 5 - 15  CBC     Status: Abnormal   Collection Time: 10/22/18  5:21 AM  Result Value Ref Range   WBC 8.6 4.0 - 10.5 K/uL   RBC 3.71 (L) 4.22 - 5.81 MIL/uL   Hemoglobin 12.4 (L) 13.0 - 17.0 g/dL   HCT 11.935.8 (L) 14.739.0 - 82.952.0 %   MCV 96.5 80.0 - 100.0 fL   MCH 33.4 26.0 - 34.0 pg   MCHC 34.6 30.0 - 36.0 g/dL   RDW 56.214.9 13.011.5 - 86.515.5 %   Platelets 190 150 - 400 K/uL   nRBC 0.0 0.0 - 0.2 %    Assessment & Plan: Present on Admission: . SDH (subdural hematoma) (HCC)    LOS: 1 day   Additional comments:I reviewed the patient's new clinical lab test results. . Suspected fall down stairs TBI/SDH - per Dr. Wynetta Emeryram, I D/W him on the unit SZ disorder - [facility] appreciate Dr. Bess HarvestArora's help regarding meds. I discussed with him on the unit. OK to wean to extubate. EEG done. Acute hypoxic ventilator dependent respiratory failure - extubate now Incidental finding thoracic aortic aneurysm - 4.2cm, needs outpatient F/U VTE - PAS FEN - possible clears if does well off vent, replete hypokalemia Dispo - ICU, lives at home with parents Critical Care Total Time*: 1536 Minutes  Violeta GelinasBurke Katyana Trolinger, MD, MPH, FACS Trauma & General Surgery: [phone]  10/22/2018  *Care during the described time interval was provided by me. I have reviewed this patient's available data, including medical history, events of note, physical examination and test results as part of my evaluation.

## 2018-10-22 NOTE — Consult Note (Addendum)
Telepsych Consultation   Reason for Consult: "suicide attempt" Referring Physician:  Dr. Georganna Skeans  Location of Patient: [facility] Location of Provider: [facility]  Patient Identification: David Dennis MRN:  [id number] Principal Diagnosis: Suicide attempt [facility]) Diagnosis:  Active Problems:   SDH (subdural hematoma) ([place])   Total Time spent with patient: 1 hour  Subjective:   David Dennis is a 53 y.o. male patient admitted with presumed seizure.  HPI:   Per chart review, patient was admitted with presumed seizure after falling from 15-20 steps on a staircase. He was found face down by a family member. He sustained a subdural hematoma. He required intubation for sedation due to combative behavior and inability to follow commands. He has a history of schizophrenia, depression, anxiety and multiple prior suicide attempts. It is unclear if his fall was accidental versus mechanical versus secondary to drug overdose. EEG was completed yesterday and results are pending. Home medications include Buspar 10 mg BID, Atarax 25 mg TID PRN, Zyprexa 20 mg qhs, Trileptal 150 mg daily and Effexor 75 mg TID.  On interview, David Dennis reports that he is doing fine. He reports feeling hopeless and depressed for several months. He denies a recent suicide attempt or thoughts to harm himself. He does not remember falling from his stairs. He later admits to a suicide attempt by overdosing on a handful of Zyprexa, Effexor 75 mg and Atarax (#10-12). He reports intermittent SI since discharge from [facility] in March. He denies current SI, HI or AVH. He reports "erratic sleep and appetite." He reports some weight gain. He reports compliance with his psychiatric medications.  Past Psychiatric History: Alcohol abuse, schizophrenia, depression, anxiety and multiple prior suicide attempts (last overdose was in February).   Risk to Self:  Yes given recent suicide attempt.  Risk to Others:   None. Denies HI.  Prior Inpatient Therapy:  He was last hospitalized at [facility] in March for suicide attempt by drug overdose (documented in another chart that has not been merged).  Prior Outpatient Therapy:  He is followed at the [facility].   Past Medical History:  Past Medical History:  Diagnosis Date  . Acute kidney failure ([place])   . Cardiac arrest ([place])   . Depression   . Paranoid schizophrenia ([place])    History reviewed. No pertinent surgical history. Family History: No family history on file. Family Psychiatric  History: Denies  Social History:  Social History   Substance and Sexual Activity  Alcohol Use Yes     Social History   Substance and Sexual Activity  Drug Use Yes    Social History   Socioeconomic History  . Marital status: Single    Spouse name: Not on file  . Number of children: Not on file  . Years of education: Not on file  . Highest education level: Not on file  Occupational History  . Not on file  Social Needs  . Financial resource strain: Not on file  . Food insecurity    Worry: Not on file    Inability: Not on file  . Transportation needs    Medical: Not on file    Non-medical: Not on file  Tobacco Use  . Smoking status: Current Every Day Smoker  . Smokeless tobacco: Never Used  Substance and Sexual Activity  . Alcohol use: Yes  . Drug use: Yes  . Sexual activity: Not on file  Lifestyle  . Physical activity    Days per week:  Not on file    Minutes per session: Not on file  . Stress: Not on file  Relationships  . Social Herbalist on phone: Not on file    Gets together: Not on file    Attends religious service: Not on file    Active member of club or organization: Not on file    Attends meetings of clubs or organizations: Not on file    Relationship status: Not on file  Other Topics Concern  . Not on file  Social History Narrative  . Not on file   Additional Social History: He lives at home with his parents. He has  never been married and does not have children. He receives disability. He is veteran. He served in [employer].     Allergies:  No Known Allergies  Labs:  Results for orders placed or performed during the hospital encounter of 10/21/18 (from the past 48 hour(s))  CDS serology     Status: None   Collection Time: 10/21/18  4:05 PM  Result Value Ref Range   CDS serology specimen      SPECIMEN WILL BE HELD FOR 14 DAYS IF TESTING IS REQUIRED    Comment: SPECIMEN WILL BE HELD FOR 14 DAYS IF TESTING IS REQUIRED SPECIMEN WILL BE HELD FOR 14 DAYS IF TESTING IS REQUIRED Performed at [facility], [place] [address], [place], [place] [address]   Comprehensive metabolic panel     Status: None   Collection Time: 10/21/18  4:05 PM  Result Value Ref Range   Sodium 140 135 - 145 mmol/L   Potassium 3.6 3.5 - 5.1 mmol/L   Chloride 105 98 - 111 mmol/L   CO2 24 22 - 32 mmol/L   Glucose, Bld 90 70 - 99 mg/dL   BUN 8 6 - 20 mg/dL   Creatinine, Ser 0.83 0.61 - 1.24 mg/dL   Calcium 9.3 8.9 - 10.3 mg/dL   Total Protein 7.5 6.5 - 8.1 g/dL   Albumin 4.4 3.5 - 5.0 g/dL   AST 28 15 - 41 U/L   ALT 17 0 - 44 U/L   Alkaline Phosphatase 83 38 - 126 U/L   Total Bilirubin 0.5 0.3 - 1.2 mg/dL   GFR calc non Af Amer >60 >60 mL/min   GFR calc Af Amer >60 >60 mL/min   Anion gap 11 5 - 15    Comment: Performed at [facility], [place] [address], [place], [place] [address]  CBC     Status: Abnormal   Collection Time: 10/21/18  4:05 PM  Result Value Ref Range   WBC 10.7 (H) 4.0 - 10.5 K/uL   RBC 4.16 (L) 4.22 - 5.81 MIL/uL   Hemoglobin 14.1 13.0 - 17.0 g/dL   HCT 40.7 39.0 - 52.0 %   MCV 97.8 80.0 - 100.0 fL   MCH 33.9 26.0 - 34.0 pg   MCHC 34.6 30.0 - 36.0 g/dL   RDW 14.5 11.5 - 15.5 %   Platelets 228 150 - 400 K/uL   nRBC 0.0 0.0 - 0.2 %    Comment: Performed at [facility], [place] [address] [place], [place] [address]  Ethanol     Status: None   Collection Time: 10/21/18  4:05 PM  Result Value  Ref Range   Alcohol, Ethyl (B) <10 <10 mg/dL    Comment: (NOTE) Lowest detectable limit for serum alcohol is 10 mg/dL. For medical purposes only. Performed at  [facility], [place] [address], [place], [place] [address]   Lactic acid, plasma     Status: None   Collection Time: 10/21/18  4:05 PM  Result Value Ref Range   Lactic Acid, Venous 0.9 0.5 - 1.9 mmol/L    Comment: Performed at [place] [address] [place], [place] [address]  Protime-INR     Status: None   Collection Time: 10/21/18  4:05 PM  Result Value Ref Range   Prothrombin Time 13.0 11.4 - 15.2 seconds   INR 1.0 0.8 - 1.2    Comment: (NOTE) INR goal varies based on device and disease states. Performed at [facility], [place] [address], [place], [place] [address]   Sample to Blood Bank     Status: None   Collection Time: 10/21/18  4:05 PM  Result Value Ref Range   Blood Bank Specimen SAMPLE AVAILABLE FOR TESTING    Sample Expiration      10/24/2018,2359 Performed at [facility], [place] [address] [place], [place] [address]   I-stat chem 8, ED     Status: Abnormal   Collection Time: 10/21/18  4:12 PM  Result Value Ref Range   Sodium 142 135 - 145 mmol/L   Potassium 3.6 3.5 - 5.1 mmol/L   Chloride 105 98 - 111 mmol/L   BUN 11 6 - 20 mg/dL   Creatinine, Ser 0.80 0.61 - 1.24 mg/dL   Glucose, Bld 89 70 - 99 mg/dL   Calcium, Ion 1.14 (L) 1.15 - 1.40 mmol/L   TCO2 29 22 - 32 mmol/L   Hemoglobin 15.0 13.0 - 17.0 g/dL   HCT 44.0 39.0 - 52.0 %  I-STAT 7, (LYTES, BLD GAS, ICA, H+H)     Status: Abnormal   Collection Time: 10/21/18  4:53 PM  Result Value Ref Range   pH, Arterial 7.433 7.350 - 7.450   pCO2 arterial 42.4 32.0 - 48.0 mmHg   pO2, Arterial 434.0 (H) 83.0 - 108.0 mmHg   Bicarbonate 28.3 (H) 20.0 - 28.0 mmol/L   TCO2 30 22 - 32 mmol/L   O2 Saturation 100.0 %   Acid-Base Excess 4.0 (H) 0.0 - 2.0 mmol/L   Sodium 141 135 - 145 mmol/L   Potassium 2.9 (L) 3.5 - 5.1 mmol/L   Calcium,  Ion 1.19 1.15 - 1.40 mmol/L   HCT 39.0 39.0 - 52.0 %   Hemoglobin 13.3 13.0 - 17.0 g/dL   Patient temperature HIDE    Collection site RADIAL, ALLEN'S TEST ACCEPTABLE    Drawn by RT    Sample type ARTERIAL   SARS CORONAVIRUS 2 (TAT 6-24 HRS) Nasopharyngeal Nasopharyngeal Swab     Status: None   Collection Time: 10/21/18  5:03 PM   Specimen: Nasopharyngeal Swab  Result Value Ref Range   SARS Coronavirus 2 NEGATIVE NEGATIVE    Comment: (NOTE) SARS-CoV-2 target nucleic acids are NOT DETECTED. The SARS-CoV-2 RNA is generally detectable in upper and lower respiratory specimens during the acute phase of infection. Negative results do not preclude SARS-CoV-2 infection, do not rule out co-infections with other pathogens, and should not be used as the sole basis for treatment or other patient management decisions. Negative results must be combined with clinical observations, patient history, and epidemiological information. The expected result is Negative. Fact Sheet for Patients: SugarRoll.be Fact Sheet for Healthcare Providers: https://www.woods-mathews.com/ This test is not yet approved or cleared by the [place] FDA and  has been authorized for  detection and/or diagnosis of SARS-CoV-2 by FDA under an Emergency Use Authorization (EUA). This EUA will remain  in effect (meaning this test can be used) for the duration of the COVID-19 declaration under Section 56 4(b)(1) of the Act, 21 U.S.C. section 360bbb-3(b)(1), unless the authorization is terminated or revoked sooner. Performed at [facility], [place] [address] [place], [place] [address]   Urinalysis, Routine w reflex microscopic     Status: Abnormal   Collection Time: 10/21/18  5:51 PM  Result Value Ref Range   Color, Urine COLORLESS (A) YELLOW   APPearance CLEAR CLEAR   Specific Gravity, Urine 1.011 1.005 - 1.030   pH 7.0 5.0 - 8.0   Glucose, UA NEGATIVE NEGATIVE mg/dL   Hgb  urine dipstick NEGATIVE NEGATIVE   Bilirubin Urine NEGATIVE NEGATIVE   Ketones, ur NEGATIVE NEGATIVE mg/dL   Protein, ur NEGATIVE NEGATIVE mg/dL   Nitrite NEGATIVE NEGATIVE   Leukocytes,Ua NEGATIVE NEGATIVE    Comment: Performed at [place] [address], [place], [place] [address]  MRSA PCR Screening     Status: None   Collection Time: 10/21/18  7:50 PM   Specimen: Nasal Mucosa; Nasopharyngeal  Result Value Ref Range   MRSA by PCR NEGATIVE NEGATIVE    Comment:        The GeneXpert MRSA Assay (FDA approved for NASAL specimens only), is one component of a comprehensive MRSA colonization surveillance program. It is not intended to diagnose MRSA infection nor to guide or monitor treatment for MRSA infections. Performed at [facility], [place] [address] [place], [place] [address]   Comprehensive metabolic panel     Status: Abnormal   Collection Time: 10/22/18  5:21 AM  Result Value Ref Range   Sodium 141 135 - 145 mmol/L   Potassium 3.0 (L) 3.5 - 5.1 mmol/L   Chloride 108 98 - 111 mmol/L   CO2 22 22 - 32 mmol/L   Glucose, Bld 84 70 - 99 mg/dL   BUN 12 6 - 20 mg/dL   Creatinine, Ser 1.18 0.61 - 1.24 mg/dL   Calcium 8.9 8.9 - 10.3 mg/dL   Total Protein 6.3 (L) 6.5 - 8.1 g/dL   Albumin 3.7 3.5 - 5.0 g/dL   AST 19 15 - 41 U/L   ALT 15 0 - 44 U/L   Alkaline Phosphatase 69 38 - 126 U/L   Total Bilirubin 0.5 0.3 - 1.2 mg/dL   GFR calc non Af Amer >60 >60 mL/min   GFR calc Af Amer >60 >60 mL/min   Anion gap 11 5 - 15    Comment: Performed at [facility], [place] [address] [place], [place] [address]  CBC     Status: Abnormal   Collection Time: 10/22/18  5:21 AM  Result Value Ref Range   WBC 8.6 4.0 - 10.5 K/uL   RBC 3.71 (L) 4.22 - 5.81 MIL/uL   Hemoglobin 12.4 (L) 13.0 - 17.0 g/dL   HCT 35.8 (L) 39.0 - 52.0 %   MCV 96.5 80.0 - 100.0 fL   MCH 33.4 26.0 - 34.0 pg   MCHC 34.6 30.0 - 36.0 g/dL   RDW 14.9 11.5 - 15.5 %   Platelets 190 150 - 400 K/uL    nRBC 0.0 0.0 - 0.2 %    Comment: Performed at [facility], [place] [address], [place],  [address]    Medications:  Current Facility-Administered Medications  Medication Dose Route Frequency  Provider Last Rate Last Dose  . 0.9 %  sodium chloride infusion   Intravenous Continuous Ileana Roup, MD   Stopped at 10/22/18 [phone]  . acetaminophen (TYLENOL) tablet 500 mg  500 mg Oral Q6H PRN Rayburn, Kelly A, PA-C      . chlorhexidine gluconate (MEDLINE KIT) (PERIDEX) 0.12 % solution 15 mL  15 mL Mouth Rinse BID Ileana Roup, MD   15 mL at 10/22/18 0751  . Chlorhexidine Gluconate Cloth 2 % PADS 6 each  6 each Topical Daily Ileana Roup, MD   6 each at 10/21/18 2005  . clevidipine (CLEVIPREX) infusion 0.5 mg/mL  0-21 mg/hr Intravenous Continuous Ileana Roup, MD   Stopped at 10/21/18 1850  . dexmedetomidine (PRECEDEX) 200 MCG/50ML (4 mcg/mL) infusion  0.4-1.2 mcg/kg/hr Intravenous Titrated Georganna Skeans, MD 23.3 mL/hr at 10/22/18 1000 1 mcg/kg/hr at 10/22/18 1000  . fentaNYL (SUBLIMAZE) injection 25-50 mcg  25-50 mcg Intravenous Q2H PRN Ileana Roup, MD   50 mcg at 10/22/18 0646  . levETIRAcetam (KEPPRA) IVPB 500 mg/100 mL premix  500 mg Intravenous Q12H Ileana Roup, MD   Stopped at 10/22/18 [phone]  . MEDLINE mouth rinse  15 mL Mouth Rinse 10 times per day Ileana Roup, MD   15 mL at 10/22/18 0175  . ondansetron (ZOFRAN-ODT) disintegrating tablet 4 mg  4 mg Oral Q6H PRN Ileana Roup, MD       Or  . ondansetron [facility]) injection 4 mg  4 mg Intravenous Q6H PRN Ileana Roup, MD      . Derrill Memo ON 10/23/2018] OXcarbazepine (TRILEPTAL) 300 MG/5ML suspension 150 mg  150 mg Per Tube Daily Georganna Skeans, MD       And  . OXcarbazepine (TRILEPTAL) 300 MG/5ML suspension 600 mg  600 mg Per Tube Daily Georganna Skeans, MD      . oxyCODONE (Oxy IR/ROXICODONE) immediate release tablet 5-10 mg  5-10 mg Oral Q4H PRN Rayburn, Kelly A, PA-C       . pantoprazole (PROTONIX) EC tablet 40 mg  40 mg Oral Daily Ileana Roup, MD       Or  . pantoprazole (PROTONIX) injection 40 mg  40 mg Intravenous Daily Ileana Roup, MD   40 mg at 10/22/18 0904  . potassium chloride 10 mEq in 100 mL IVPB  10 mEq Intravenous Q1 Hr x 4 Georganna Skeans, MD 100 mL/hr at 10/22/18 1000      Musculoskeletal: Strength & Muscle Tone: No atrophy noted. Gait & Station: unable to stand Patient leans: N/A  Psychiatric Specialty Exam: Physical Exam  Nursing note and vitals reviewed. Constitutional: He is oriented to person, place, and time. He appears well-developed and well-nourished.  HENT:  Head: Normocephalic and atraumatic.  Neck: Normal range of motion.  Respiratory: Effort normal.  Musculoskeletal: Normal range of motion.  Neurological: He is alert and oriented to person, place, and time.  Psychiatric: His speech is normal and behavior is normal. Judgment and thought content normal. Cognition and memory are normal. He exhibits a depressed mood.    Review of Systems  Gastrointestinal: Negative for constipation, diarrhea, nausea and vomiting.  Psychiatric/Behavioral: Positive for depression and suicidal ideas. Negative for hallucinations.  All other systems reviewed and are negative.   Blood pressure (!) 130/91, pulse 61, temperature 97.7 F (36.5 C), temperature source Axillary, resp. rate 13, height _0  (1.803 m), weight 93 kg, SpO2 98 %.Body mass index is 28.59 kg/m.  General Appearance: Fairly  Groomed, middle aged, Caucasian male, wearing a hospital gown with a c-collar in place who is lying in bed. NAD.   Eye Contact:  Good  Speech:  Clear and Coherent and Normal Rate  Volume:  Normal  Mood:  Depressed  Affect:  Constricted  Thought Process:  Goal Directed, Linear and Descriptions of Associations: Intact  Orientation:  Full (Time, Place, and Person)  Thought Content:  Logical  Suicidal Thoughts:  No  Homicidal Thoughts:   No  Memory:  Immediate;   Good Recent;   Good Remote;   Good  Judgement:  Fair  Insight:  Fair  Psychomotor Activity:  Normal  Concentration:  Concentration: Good and Attention Span: Good  Recall:  Good  Fund of Knowledge:  Good  Language:  Good  Akathisia:  No  Handed:  Right  AIMS (if indicated):   N/A  Assets:  Communication Skills Desire for Improvement Housing Resilience Social Support  ADL's:  Impaired  Cognition:  WNL  Sleep:   Fair   Assessment:  David Dennis is a 53 y.o. male who was admitted with presumed seizure after falling from 15-20 steps on a staircase. He was found face down by a family member. He sustained a subdural hematoma. He required intubation for sedation due to combative behavior and inability to follow commands. He admits to a suicide attempt by polydrug overdose in the setting of depression. He has a history of prior suicide attempts. He warrants inpatient psychiatric hospitalization for stabilization and treatment.   Treatment Plan Summary: -Patient warrants inpatient psychiatric hospitalization given high risk of harm to self. -Continue bedside sitter.  -Continue to hold psychotropic medications until patient is medically stable. -Obtain EKG to monitor for QTc prolongation.  -Please pursue involuntary commitment if patient refuses voluntary psychiatric hospitalization or attempts to leave the hospital.  -Will sign off on patient at this time. Please consult psychiatry again as needed.     Disposition: Recommend psychiatric Inpatient admission when medically cleared.  This service was provided via telemedicine using a 2-way, interactive audio and video technology.  Names of all persons participating in this telemedicine service and their role in this encounter. Name: Buford Dresser, DO Role: Psychiatrist   Name: David Dennis Role: Patient    Faythe Dingwall, DO 10/22/2018 10:54 AM

## 2018-10-22 NOTE — Progress Notes (Signed)
Initial Nutrition Assessment  RD working remotely.  DOCUMENTATION CODES:   Not applicable  INTERVENTION:  If unable to extubate within 48 hours, recommend:  Initiate Vital High Protein @ 60 ml/hr via OGT  Tube feeding regimen provides 1440 kcal, 126 grams of protein, and 1204 ml of H2O.   Tube feeding regimen in addition to propofol regimen provides 1956 kcal (100% of needs).   NUTRITION DIAGNOSIS:   Inadequate oral intake related to inability to eat as evidenced by NPO status.  GOAL:   Patient will meet greater than or equal to 90% of their needs  MONITOR:   Vent status, Labs, Weight trends, Skin, I & O's  REASON FOR ASSESSMENT:   Ventilator    ASSESSMENT:   David Dennis is an 53 y.o. male. HPI: 61yoM with presumed history of CHF, epilepsy, EtOH abuse, depression/anxiety/psychiatric whom presented as level 1 - was found down at bottom of stairs by his parents per EMS. He was noted to have GCS 8 en route. On arrival, GCS 8; would not follow commands or lie still; combative; he was therefore intubated by ED team. History limited due to this.  Pt admitted with rt SDH and seizures.   Patient is currently intubated on ventilator support. OGT placement verified by x-ray; currently clamped.  MV: 9.3 L/min Temp (24hrs), Avg:97.8 F (36.6 C), Min:97.1 F (36.2 C), Max:98.3 F (36.8 C)  Propofol: 19.53 ml/hr (provides 516 kcals daily)  MAP: 116  Reviewed I/O's: +507 ml x 24 hours  UOP: 115 ml x 24 hours  Labs reviewed: K: 3.4.   Diet Order:   Diet Order            Diet NPO time specified  Diet effective now              EDUCATION NEEDS:   Not appropriate for education at this time  Skin:  Skin Assessment: Reviewed RN Assessment  Last BM:  Unknown  Height:   Ht Readings from Last 1 Encounters:  10/21/18 5\' 11"  (1.803 m)    Weight:   Wt Readings from Last 1 Encounters:  10/21/18 93 kg    Ideal Body Weight:  78.2 kg  BMI:  Body  mass index is 28.59 kg/m.  Estimated Nutritional Needs:   Kcal:  1960  Protein:  120-135 grams  Fluid:  > 2.0 L    David Dennis A. Jimmye Norman, RD, LDN, [place] Registered Dietitian II Certified Diabetes Care and Education Specialist Pager: [phone] After hours Pager: [phone]

## 2018-10-22 NOTE — Progress Notes (Signed)
Transported patient from 4N16 to CT and back without event.

## 2018-10-22 NOTE — Progress Notes (Signed)
Assisted tele visit to patient with provider.  Shelley Pooley R, RN  

## 2018-10-22 NOTE — Procedures (Signed)
Patient Name: David Dennis  MRN: [id number]  Epilepsy Attending: Lora Havens  Referring Physician/Provider: Dr Amie [place] Date: 10/21/2018 Duration: 23.50 mins  Patient history: 53 year old male with right-sided subdural hemorrhage.  EEG to evaluate for seizures.  Level of alertness: Comatose  AEDs during EEG study: Ozcarb, Keppra  Technical aspects: This EEG study was done with scalp electrodes positioned according to the 10-20 International system of electrode placement. Electrical activity was acquired at a sampling rate of 500Hz  and reviewed with a high frequency filter of 70Hz  and a low frequency filter of 1Hz . EEG data were recorded continuously and digitally stored.   DESCRIPTION: EEG showed mixed frequencies with 2 to 5 Hz generalized theta- delta slowing interspersed with 8 to 10 Hz generalized alpha rhythm.  EEG was not reactive to tactile stimulation. Hyperventilation and photic stimulation were not performed.  IMPRESSION: This study is profound diffuse encephalopathy. No seizures or epileptiform discharges were seen throughout the recording.

## 2018-10-22 NOTE — Progress Notes (Signed)
Patient ID: David Dennis, male   DOB: 1965/05/14, 53 y.o.   MRN: [id number] Patient remains intubated and not responsive however when light on sedation is wildly purposeful  CT stable no new neurosurgical recommendations

## 2018-10-22 NOTE — Progress Notes (Signed)
Neurology Progress Note   S:// Seen and examined in the ICU. On propofol.  Propofol held for exam   O:// Current vital signs: BP 114/80   Pulse 61   Temp 97.8 F (36.6 C) (Axillary)   Resp 16   Ht _0  (1.803 m)   Wt 93 kg   SpO2 99%   BMI 28.59 kg/m  Vital signs in last 24 hours: Temp:  [97.1 F (36.2 C)-98.3 F (36.8 C)] 97.8 F (36.6 C) (09/10 0400) Pulse Rate:  [61-104] 61 (09/10 0719) Resp:  [0-25] 16 (09/10 0719) BP: (85-225)/(55-166) 114/80 (09/10 0700) SpO2:  [93 %-100 %] 99 % (09/10 0719) FiO2 (%):  [40 %-100 %] 40 % (09/10 0800) Weight:  [93 kg] 93 kg (09/09 1613) General: Intubated, on sedation with propofol which was held for exam HEENT: Right eye injury as noted below. CVS: Regular rate rhythm Respiratory: Vented Extremities: Warm well perfused Neurological exam After holding sedation, starts to move all 4 extremities with strong force. Is able to follow all commands-able to give thumbs up, show 2 fingers on command. Able to appropriately nod yes and no.  He told me that he was in the [employer]. Cranial nerves: Pupils are equal round reactive to light, extraocular movements are intact, blinks to threat from both sides, facial symmetry is difficult to ascertain. Motor exam: Normal 5/5 strength in all 4 extremities. Sensory: Intact light touch all over Coordination is difficult to assess due to some amount of agitation   Medications  Current Facility-Administered Medications:  .  0.9 %  sodium chloride infusion, , Intravenous, Continuous, Ileana Roup, MD, Last Rate: 100 mL/hr at 10/22/18 0800 .  chlorhexidine gluconate (MEDLINE KIT) (PERIDEX) 0.12 % solution 15 mL, 15 mL, Mouth Rinse, BID, Ileana Roup, MD, 15 mL at 10/22/18 0751 .  Chlorhexidine Gluconate Cloth 2 % PADS 6 each, 6 each, Topical, Daily, Ileana Roup, MD, 6 each at 10/21/18 2005 .  clevidipine (CLEVIPREX) infusion 0.5 mg/mL, 0-21 mg/hr, Intravenous, Continuous, White,  Sharon Mt, MD, Stopped at 10/21/18 1850 .  fentaNYL (SUBLIMAZE) injection 25-50 mcg, 25-50 mcg, Intravenous, Q2H PRN, Ileana Roup, MD, 50 mcg at 10/22/18 0646 .  levETIRAcetam (KEPPRA) IVPB 500 mg/100 mL premix, 500 mg, Intravenous, Q12H, Ileana Roup, MD, Stopped at 10/22/18 [id number] .  MEDLINE mouth rinse, 15 mL, Mouth Rinse, 10 times per day, Ileana Roup, MD, 15 mL at 10/22/18 0617 .  ondansetron (ZOFRAN-ODT) disintegrating tablet 4 mg, 4 mg, Oral, Q6H PRN **OR** ondansetron (ZOFRAN) injection 4 mg, 4 mg, Intravenous, Q6H PRN, Ileana Roup, MD .  OXcarbazepine (TRILEPTAL) tablet 150 mg, 150 mg, Oral, q morning - 10a, Amie Portland, MD .  Oxcarbazepine (TRILEPTAL) tablet 600 mg, 600 mg, Per Tube, QPM, Amie Portland, MD .  pantoprazole (PROTONIX) EC tablet 40 mg, 40 mg, Oral, Daily **OR** pantoprazole (PROTONIX) injection 40 mg, 40 mg, Intravenous, Daily, White, Christopher M, MD .  propofol (DIPRIVAN) 1000 MG/100ML infusion, 5-80 mcg/kg/min, Intravenous, Continuous, Ileana Roup, MD, Last Rate: 19.53 mL/hr at 10/22/18 0800, 35 mcg/kg/min at 10/22/18 0800 Labs CBC    Component Value Date/Time   WBC 8.6 10/22/2018 0521   RBC 3.71 (L) 10/22/2018 0521   HGB 12.4 (L) 10/22/2018 0521   HCT 35.8 (L) 10/22/2018 0521   PLT 190 10/22/2018 0521   MCV 96.5 10/22/2018 0521   MCH 33.4 10/22/2018 0521   MCHC 34.6 10/22/2018 0521   RDW 14.9 10/22/2018 0521  CMP     Component Value Date/Time   NA 141 10/22/2018 0521   K 3.0 (L) 10/22/2018 0521   CL 108 10/22/2018 0521   CO2 22 10/22/2018 0521   GLUCOSE 84 10/22/2018 0521   BUN 12 10/22/2018 0521   CREATININE 1.18 10/22/2018 0521   CALCIUM 8.9 10/22/2018 0521   PROT 6.3 (L) 10/22/2018 0521   ALBUMIN 3.7 10/22/2018 0521   AST 19 10/22/2018 0521   ALT 15 10/22/2018 0521   ALKPHOS 69 10/22/2018 0521   BILITOT 0.5 10/22/2018 0521   GFRNONAA >60 10/22/2018 0521   GFRAA >60 10/22/2018 0521    Imaging I have reviewed images in epic and the results pertinent to this consultation are: Repeat CT head with the stable right-sided trace subdural-likely traumatic  Assessment: 53 year old man with extensive history of schizophrenia, anxiety depression prior suicidal attempts history of seizures on oxcarbazepine and Keppra-regular care at the [facility], presenting as a level 2 trauma after having a fall from 15-20 steps on a staircase.  It was presumed that he had a seizure prior to this fall. Stat EEG in the emergency room was unremarkable Antiepileptics-Keppra given IV and oxcarbazepine via OG tube. Exam is very purposeful.  Might be a good candidate for early extubation.  Impression: Seizure possible status epilepticus leading to fall Subdural hematoma secondary to head trauma from the fall Hypertensive emergency Multifactorial encephalopathy due to the above History of alcohol abuse and schizophrenia/anxiety/depression/suicide attempt in the past.  Recommendations:  Seizure/encephalopathy:  -Continue home doses of antiepileptics-Keppra 500 twice daily as well as oxcarbazepine 150 mg in the morning and 600 mg at night. -I would recommend extubation trial-per primary team. -Continue to maintain seizure precautions -Correction of any toxic metabolic derangements on labs per primary team.  Subdural hematoma: Not a surgical candidate.  Further management per neurosurgery. Blood pressure-goal systolic less than 737  History of alcohol abuse: -Given history of alcohol abuse, Precedex drip might be beneficial even when extubated.  History of schizophrenia, anxiety, depression and prior suicide attempt: -Might need an inpatient psychiatry consultation once extubated-unclear if the fall was accidental/mechanical or because of any kind of medication overdose given the history of suicide attempts by medication overdose.  Also would be beneficial for recommendations on optimizing  medications. -Urinary toxicology screen is still pending.  I discussed my plan with Dr. Grandville Silos over the phone in detail.   -- Amie Portland, MD [facility] Pager: [phone] If 7pm to 7am, please call on call as listed on AMION.  CRITICAL CARE ATTESTATION Performed by: Amie Portland, MD Total critical care time: 30 minutes Critical care time was exclusive of separately billable procedures and treating other patients and/or supervising APPs/Residents/Students Critical care was necessary to treat or prevent imminent or life-threatening deterioration due to breakthrough seizure, status epilepticus, subdural hematoma, hypertensive emergency. This patient is critically ill and at significant risk for neurological worsening and/or death and care requires constant monitoring. Critical care was time spent personally by me on the following activities: development of treatment plan with patient and/or surrogate as well as nursing, discussions with consultants, evaluation of patient's response to treatment, examination of patient, obtaining history from patient or surrogate, ordering and performing treatments and interventions, ordering and review of laboratory studies, ordering and review of radiographic studies, pulse oximetry, re-evaluation of patient's condition, participation in multidisciplinary rounds and medical decision making of high complexity in the care of this patient.

## 2018-10-23 ENCOUNTER — Inpatient Hospital Stay (HOSPITAL_COMMUNITY): Payer: No Typology Code available for payment source

## 2018-10-23 DIAGNOSIS — T1491XA Suicide attempt, initial encounter: Secondary | ICD-10-CM

## 2018-10-23 LAB — PHOSPHORUS: Phosphorus: 2.9 mg/dL (ref 2.5–4.6)

## 2018-10-23 LAB — BASIC METABOLIC PANEL
Anion gap: 10 (ref 5–15)
BUN: 9 mg/dL (ref 6–20)
CO2: 21 mmol/L — ABNORMAL LOW (ref 22–32)
Calcium: 8.5 mg/dL — ABNORMAL LOW (ref 8.9–10.3)
Chloride: 112 mmol/L — ABNORMAL HIGH (ref 98–111)
Creatinine, Ser: 0.92 mg/dL (ref 0.61–1.24)
GFR calc Af Amer: >60 mL/min (ref >60)
GFR calc non Af Amer: >60 mL/min (ref >60)
Glucose, Bld: 84 mg/dL (ref 70–99)
Potassium: 3.6 mmol/L (ref 3.5–5.1)
Sodium: 143 mmol/L (ref 135–145)

## 2018-10-23 LAB — CBC
HCT: 35.4 % — ABNORMAL LOW (ref 39.0–52.0)
HCT: 35.1 % — ABNORMAL LOW (ref 39.0–52.0)
Hemoglobin: 12.2 g/dL — ABNORMAL LOW (ref 13.0–17.0)
Hemoglobin: 12.1 g/dL — ABNORMAL LOW (ref 13.0–17.0)
MCH: 33.7 pg (ref 26.0–34.0)
MCH: 33.1 pg (ref 26.0–34.0)
MCHC: 34.5 g/dL (ref 30.0–36.0)
MCHC: 34.5 g/dL (ref 30.0–36.0)
MCV: 97.8 fL (ref 80.0–100.0)
MCV: 95.9 fL (ref 80.0–100.0)
Platelets: 124 10*3/uL — ABNORMAL LOW (ref 150–400)
Platelets: 184 10*3/uL (ref 150–400)
RBC: 3.62 MIL/uL — ABNORMAL LOW (ref 4.22–5.81)
RBC: 3.66 MIL/uL — ABNORMAL LOW (ref 4.22–5.81)
RDW: 14.8 % (ref 11.5–15.5)
RDW: 14.6 % (ref 11.5–15.5)
WBC: 9.1 10*3/uL (ref 4.0–10.5)
WBC: 9.4 10*3/uL (ref 4.0–10.5)
nRBC: 0 % (ref 0.0–0.2)
nRBC: 0 % (ref 0.0–0.2)

## 2018-10-23 LAB — MAGNESIUM: Magnesium: 1.8 mg/dL (ref 1.7–2.4)

## 2018-10-23 LAB — COMPREHENSIVE METABOLIC PANEL
ALT: 14 U/L (ref 0–44)
AST: 18 U/L (ref 15–41)
Albumin: 3.9 g/dL (ref 3.5–5.0)
Alkaline Phosphatase: 70 U/L (ref 38–126)
Anion gap: 11 (ref 5–15)
BUN: 6 mg/dL (ref 6–20)
CO2: 23 mmol/L (ref 22–32)
Calcium: 9.1 mg/dL (ref 8.9–10.3)
Chloride: 108 mmol/L (ref 98–111)
Creatinine, Ser: 0.86 mg/dL (ref 0.61–1.24)
GFR calc Af Amer: >60 mL/min (ref >60)
GFR calc non Af Amer: >60 mL/min (ref >60)
Glucose, Bld: 103 mg/dL — ABNORMAL HIGH (ref 70–99)
Potassium: 3 mmol/L — ABNORMAL LOW (ref 3.5–5.1)
Sodium: 142 mmol/L (ref 135–145)
Total Bilirubin: 0.7 mg/dL (ref 0.3–1.2)
Total Protein: 6.8 g/dL (ref 6.5–8.1)

## 2018-10-23 LAB — TRIGLYCERIDES: Triglycerides: 118 mg/dL (ref <150)

## 2018-10-23 MED ORDER — HYDRALAZINE HCL 20 MG/ML IJ SOLN
10.0000 mg | Freq: Four times a day (QID) | INTRAMUSCULAR | Status: DC | PRN
  Administered 2018-10-23 – 2018-10-26 (×6): 10 mg via INTRAVENOUS
  Filled 2018-10-23 (×7): qty 1

## 2018-10-23 MED ORDER — LORAZEPAM 1 MG PO TABS: 1.0000 mg | ORAL_TABLET | ORAL | Status: AC | PRN

## 2018-10-23 MED ORDER — THIAMINE HCL 100 MG/ML IJ SOLN: 100.0000 mg | Freq: Every day | INTRAMUSCULAR | Status: DC

## 2018-10-23 MED ORDER — POTASSIUM CHLORIDE CRYS ER 20 MEQ PO TBCR
20.0000 meq | EXTENDED_RELEASE_TABLET | Freq: Two times a day (BID) | ORAL | Status: AC
  Administered 2018-10-23 – 2018-10-24 (×3): 20 meq via ORAL
  Filled 2018-10-23 (×3): qty 1

## 2018-10-23 MED ORDER — OLANZAPINE 5 MG PO TABS
20.0000 mg | ORAL_TABLET | Freq: Every day | ORAL | Status: DC
  Administered 2018-10-23 – 2018-10-27 (×5): 20 mg via ORAL
  Filled 2018-10-23: qty 2
  Filled 2018-10-23 (×5): qty 4

## 2018-10-23 MED ORDER — VITAMIN B-1 100 MG PO TABS
100.0000 mg | ORAL_TABLET | Freq: Every day | ORAL | Status: DC
  Administered 2018-10-23 – 2018-10-27 (×4): 100 mg via ORAL
  Filled 2018-10-23 (×4): qty 1

## 2018-10-23 MED ORDER — HYDRALAZINE HCL 20 MG/ML IJ SOLN
10.0000 mg | Freq: Once | INTRAMUSCULAR | Status: AC
  Administered 2018-10-23: 21:00:00 10 mg via INTRAVENOUS

## 2018-10-23 MED ORDER — CARVEDILOL 25 MG PO TABS
25.0000 mg | ORAL_TABLET | Freq: Two times a day (BID) | ORAL | Status: DC
  Administered 2018-10-23 – 2018-10-27 (×10): 25 mg via ORAL
  Filled 2018-10-23 (×2): qty 1
  Filled 2018-10-23: qty 2
  Filled 2018-10-23 (×7): qty 1

## 2018-10-23 MED ORDER — ADULT MULTIVITAMIN W/MINERALS CH
1.0000 | ORAL_TABLET | Freq: Every day | ORAL | Status: DC
  Administered 2018-10-23 – 2018-10-27 (×5): 1 via ORAL
  Filled 2018-10-23 (×5): qty 1

## 2018-10-23 MED ORDER — FOLIC ACID 1 MG PO TABS
1.0000 mg | ORAL_TABLET | Freq: Every day | ORAL | Status: DC
  Administered 2018-10-23 – 2018-10-27 (×5): 1 mg via ORAL
  Filled 2018-10-23 (×5): qty 1

## 2018-10-23 MED ORDER — HYDROXYZINE HCL 25 MG PO TABS
25.0000 mg | ORAL_TABLET | Freq: Three times a day (TID) | ORAL | Status: DC | PRN
  Filled 2018-10-23: qty 1

## 2018-10-23 MED ORDER — LEVETIRACETAM 500 MG PO TABS
500.0000 mg | ORAL_TABLET | Freq: Two times a day (BID) | ORAL | Status: DC
  Administered 2018-10-23 – 2018-10-27 (×10): 500 mg via ORAL
  Filled 2018-10-23 (×10): qty 1

## 2018-10-23 MED ORDER — VENLAFAXINE HCL 75 MG PO TABS
75.0000 mg | ORAL_TABLET | Freq: Three times a day (TID) | ORAL | Status: DC
  Administered 2018-10-23 – 2018-10-27 (×13): 75 mg via ORAL
  Filled 2018-10-23 (×4): qty 1
  Filled 2018-10-23: qty 1.5
  Filled 2018-10-23 (×10): qty 1

## 2018-10-23 MED ORDER — BUSPIRONE HCL 5 MG PO TABS
10.0000 mg | ORAL_TABLET | Freq: Two times a day (BID) | ORAL | Status: DC
  Administered 2018-10-23 – 2018-10-27 (×10): 10 mg via ORAL
  Filled 2018-10-23 (×3): qty 2
  Filled 2018-10-23: qty 1
  Filled 2018-10-23 (×6): qty 2

## 2018-10-23 MED ORDER — LORAZEPAM 2 MG/ML IJ SOLN: 1.0000 mg | INTRAMUSCULAR | Status: AC | PRN

## 2018-10-23 MED ORDER — LABETALOL HCL 5 MG/ML IV SOLN
INTRAVENOUS | Status: AC
  Filled 2018-10-23: qty 4

## 2018-10-23 MED ORDER — LABETALOL HCL 5 MG/ML IV SOLN
10.0000 mg | INTRAVENOUS | Status: DC | PRN
  Administered 2018-10-23 (×3): 10 mg via INTRAVENOUS
  Filled 2018-10-23 (×2): qty 4

## 2018-10-23 NOTE — Evaluation (Signed)
Speech Language Pathology Evaluation Patient Details Name: David Dennis MRN: [id number] DOB: 06/01/1965 Today's Date: 10/23/2018 Time: 5929-2446 SLP Time Calculation (min) (ACUTE ONLY): 24 min  Problem List:  Patient Active Problem List   Diagnosis Date Noted  . Suicide attempt ([place])   . SDH (subdural hematoma) ([place]) 10/21/2018   Past Medical History:  Past Medical History:  Diagnosis Date  . Acute kidney failure ([place])   . Cardiac arrest ([place])   . Depression   . Paranoid schizophrenia ([place])    Past Surgical History: History reviewed. No pertinent surgical history. HPI:  53 yo with presumed history of CHF, epilepsy, ETOH abuse, depression/anxiety/paranoid schizophrenia with prior suicide attempt 2/20 who was found down at bottom of stairs by his parents. GCS 8 en route. CT acute right subdural hematoma overlying the right cerebral hemisphere measuring approximately 6 mm in thickness. There is mild mass effect upon the underlying brain parenchyma with 2-3 mm of right to left midline shift. Psychiatry assessed via telehealth and recommends inpatient psychiatric hospitalization at discharge.    Assessment / Plan / Recommendation Clinical Impression  Pt states he has not worked for the past 18 years, lives with his parents and reports no difficulty with speech or cognition prior to admission or presently. He states difficulty with name recall. He was intelligible during discourse with intact language and cognitive abilities. He scored a 26/30 on MOCA which is in average range. Recalled 2/5 words after approx 5 minute delay and was only one short during category naming and did not get credit for that subtest. He uses post it notes to remind himself of important information. Manages finances and medications independently. No ST follow up is needed.        SLP Assessment  SLP Recommendation/Assessment: Patient does not need any further Speech Lanaguage Pathology Services SLP Visit  Diagnosis: Cognitive communication deficit (R41.841)    Follow Up Recommendations  None    Frequency and Duration           SLP Evaluation Cognition  Overall Cognitive Status: Within Functional Limits for tasks assessed       Comprehension  Auditory Comprehension Overall Auditory Comprehension: Appears within functional limits for tasks assessed Visual Recognition/Discrimination Discrimination: Not tested Reading Comprehension Reading Status: Not tested    Expression Expression Primary Mode of Expression: Verbal Verbal Expression Overall Verbal Expression: Appears within functional limits for tasks assessed Written Expression Dominant Hand: Right Written Expression: Not tested   Oral / Motor  Oral Motor/Sensory Function Overall Oral Motor/Sensory Function: Within functional limits Motor Speech Overall Motor Speech: Appears within functional limits for tasks assessed   GO                    Houston Siren 10/23/2018, 9:53 AM  Orbie Pyo Colvin Caroli.Ed Risk analyst [phone] Office [phone]

## 2018-10-23 NOTE — Social Work (Signed)
Per bedside RN Suezanne Jacquet no documentation of IVC; pt with bedside sitter.  Will see if pt would voluntarily agree to [facility] placement. Will call referral to [facility] and [facility].  Westley Hummer, MSW, [place] Work [phone]

## 2018-10-23 NOTE — Progress Notes (Addendum)
[facility] Progress Note     Subjective: CC: confusion Patient reports some confusion and fogginess this AM. Oriented and remembers that he intentionally overdosed and fell down the stairs. Patient does not remember talking to psychiatry yesterday but voluntarily agrees to go to inpatient psychiatric facility on discharge. Tolerating diet, denies nausea.   Objective: Vital signs in last 24 hours: Temp:  [97.8 F (36.6 C)-99 F (37.2 C)] 98.7 F (37.1 C) (09/11 0514) Pulse Rate:  [58-74] 68 (09/11 0700) Resp:  [8-29] 16 (09/11 0700) BP: (130-176)/(91-111) 167/108 (09/11 0700) SpO2:  [95 %-99 %] 98 % (09/11 0700) Last BM Date: (PTA)  Intake/Output from previous day: 09/10 0701 - 09/11 0700 In: 3226.9 [P.O.:480; I.V.:2147; IV Piggyback:599.9] Out: 2155 [Urine:2155] Intake/Output this shift: No intake/output data recorded.  PE: Gen:  Alert, NAD, pleasant ENT: ecchymosis over right eye Card:  Regular rate and rhythm Pulm:  Normal effort, clear to auscultation bilaterally Abd: Soft, non-tender, non-distended,+BS Skin: warm and dry, no rashes  Neuro: A&Ox4, PERRL, speech clear  Lab Results:  Recent Labs    10/22/18 0521 10/23/18 0216  WBC 8.6 9.1  HGB 12.4* 12.2*  HCT 35.8* 35.4*  PLT 190 124*   BMET Recent Labs    10/22/18 0521 10/23/18 0216  NA 141 143  K 3.0* 3.6  CL 108 112*  CO2 22 21*  GLUCOSE 84 84  BUN 12 9  CREATININE 1.18 0.92  CALCIUM 8.9 8.5*   PT/INR Recent Labs    10/21/18 1605  LABPROT 13.0  INR 1.0   CMP     Component Value Date/Time   NA 143 10/23/2018 0216   K 3.6 10/23/2018 0216   CL 112 (H) 10/23/2018 0216   CO2 21 (L) 10/23/2018 0216   GLUCOSE 84 10/23/2018 0216   BUN 9 10/23/2018 0216   CREATININE 0.92 10/23/2018 0216   CALCIUM 8.5 (L) 10/23/2018 0216   PROT 6.3 (L) 10/22/2018 0521   ALBUMIN 3.7 10/22/2018 0521   AST 19 10/22/2018 0521   ALT 15 10/22/2018 0521   ALKPHOS 69 10/22/2018 0521   BILITOT 0.5  10/22/2018 0521   GFRNONAA >60 10/23/2018 0216   GFRAA >60 10/23/2018 0216   Lipase  No results found for: LIPASE     Studies/Results: Ct Head Wo Contrast  Result Date: 10/22/2018 CLINICAL DATA:  Follow-up subdural hemorrhage EXAM: CT HEAD WITHOUT CONTRAST TECHNIQUE: Contiguous axial images were obtained from the base of the skull through the vertex without intravenous contrast. COMPARISON:  10/21/2018 FINDINGS: Brain: Right subdural hematoma is again noted, stable in size since prior study measuring 6 mm in thickness. No significant midline shift. No hydrocephalus or acute infarction. Vascular: No hyperdense vessel or unexpected calcification. Skull: No acute calvarial abnormality. Sinuses/Orbits: Visualized paranasal sinuses and mastoids clear. Orbital soft tissues unremarkable. Other: None IMPRESSION: 6 mm right subdural hematoma. Is stable in size since prior study. No measurable midline shift. Electronically Signed   By: Rolm Baptise M.D.   On: 10/22/2018 03:30   Ct Head Wo Contrast  Result Date: 10/21/2018 CLINICAL DATA:  Altered mental status.  Un witnessed fall. EXAM: CT HEAD WITHOUT CONTRAST CT CERVICAL SPINE WITHOUT CONTRAST TECHNIQUE: Multidetector CT imaging of the head and cervical spine was performed following the standard protocol without intravenous contrast. Multiplanar CT image reconstructions of the cervical spine were also generated. COMPARISON:  None. FINDINGS: CT HEAD FINDINGS Brain: Acute right subdural hematoma is identified overlying the right cerebral hemisphere. Right to left midline shift  by approximately 2-3 mm. A small right anterior para falcine subdural hematoma is also identified, image 26/1 measuring 2 mm. The ventricular volumes are normal. No intraventricular hemorrhage. Vascular: No hyperdense vessel or unexpected calcification. Skull: Normal. Negative for fracture or focal lesion. Sinuses/Orbits: No acute finding. Other: None. CT CERVICAL SPINE FINDINGS  Alignment: Normal. Skull base and vertebrae: No acute fracture. No primary bone lesion or focal pathologic process. Soft tissues and spinal canal: No prevertebral fluid or swelling. No visible canal hematoma. Disc levels: Disc spaces are well preserved. Mild multi level ventral and posterior endplate spurring. Upper chest: Negative. Other: None IMPRESSION: 1. Acute right subdural hematoma overlying the right cerebral hemisphere measuring approximately 6 mm in thickness. There is mild mass effect upon the underlying brain parenchyma with 2-3 mm of right to left midline shift. 2. No cervical spine fracture. 3. Critical Value/emergent results were called by telephone at the time of interpretation on 10/21/2018 at 5:24 pm to providerDr. Cliffton Asters, who verbally acknowledged these results. Electronically Signed   By: Signa Kell M.D.   On: 10/21/2018 17:24   Ct Chest W Contrast  Result Date: 10/21/2018 CLINICAL DATA:  Found by family members face down at bottom of steps. Unresponsive. EXAM: CT CHEST, ABDOMEN, AND PELVIS WITH CONTRAST TECHNIQUE: Multidetector CT imaging of the chest, abdomen and pelvis was performed following the standard protocol during bolus administration of intravenous contrast. CONTRAST:  OMNIPAQUE IOHEXOL 300 MG/ML  SOLN COMPARISON:  None. FINDINGS: CT CHEST FINDINGS Cardiovascular: Normal heart size. Increase caliber of the ascending thoracic aorta Measures 4.2 cm, image 55/5. Mediastinum/Nodes: ET tube tip is above the carina. There is a nasogastric tube with tip just below the GE junction. Normal appearance of the thyroid gland. No enlarged mediastinal or hilar lymph nodes. Lungs/Pleura: No pleural effusion. Bilateral lower lobe atelectasis and airspace consolidation noted right greater than left. Cannot rule out aspiration. No pneumothorax. Musculoskeletal: No chest wall mass or suspicious bone lesions identified. CT ABDOMEN PELVIS FINDINGS Hepatobiliary: No hepatic injury or perihepatic  hematoma. Gallbladder is unremarkable Pancreas: Unremarkable. No pancreatic ductal dilatation or surrounding inflammatory changes. Spleen: No splenic injury or perisplenic hematoma. Adrenals/Urinary Tract: No adrenal hemorrhage or renal injury identified. Bladder is diffusely distended. Stomach/Bowel: Stomach is within normal limits. Appendix appears normal. No evidence of bowel wall thickening, distention, or inflammatory changes. Vascular/Lymphatic: No significant vascular findings are present. No enlarged abdominal or pelvic lymph nodes. Reproductive: Prostate is unremarkable. Other: No abdominal wall hernia or abnormality. No abdominopelvic ascites. Musculoskeletal: No acute or significant osseous findings. IMPRESSION: 1. Status post intubation. Bilateral lower lobe airspace opacities and atelectasis concerning for aspiration. No findings to suggest solid organ injury within the chest, abdomen or pelvis. No acute osseous abnormality. 2. Ascending thoracic aorta measures 4.2 cm. Recommend annual imaging followup by CTA or MRA. This recommendation follows 2010 ACCF/AHA/AATS/ACR/ASA/SCA/SCAI/SIR/STS/SVM Guidelines for the Diagnosis and Management of Patients with Thoracic Aortic Disease. Circulation. 2010; 121: H403-T248. Aortic aneurysm NOS (ICD10-I71.9) Electronically Signed   By: Signa Kell M.D.   On: 10/21/2018 17:42   Ct Cervical Spine Wo Contrast  Result Date: 10/21/2018 CLINICAL DATA:  Altered mental status.  Un witnessed fall. EXAM: CT HEAD WITHOUT CONTRAST CT CERVICAL SPINE WITHOUT CONTRAST TECHNIQUE: Multidetector CT imaging of the head and cervical spine was performed following the standard protocol without intravenous contrast. Multiplanar CT image reconstructions of the cervical spine were also generated. COMPARISON:  None. FINDINGS: CT HEAD FINDINGS Brain: Acute right subdural hematoma is identified overlying the right  cerebral hemisphere. Right to left midline shift by approximately 2-3 mm. A  small right anterior para falcine subdural hematoma is also identified, image 26/1 measuring 2 mm. The ventricular volumes are normal. No intraventricular hemorrhage. Vascular: No hyperdense vessel or unexpected calcification. Skull: Normal. Negative for fracture or focal lesion. Sinuses/Orbits: No acute finding. Other: None. CT CERVICAL SPINE FINDINGS Alignment: Normal. Skull base and vertebrae: No acute fracture. No primary bone lesion or focal pathologic process. Soft tissues and spinal canal: No prevertebral fluid or swelling. No visible canal hematoma. Disc levels: Disc spaces are well preserved. Mild multi level ventral and posterior endplate spurring. Upper chest: Negative. Other: None IMPRESSION: 1. Acute right subdural hematoma overlying the right cerebral hemisphere measuring approximately 6 mm in thickness. There is mild mass effect upon the underlying brain parenchyma with 2-3 mm of right to left midline shift. 2. No cervical spine fracture. 3. Critical Value/emergent results were called by telephone at the time of interpretation on 10/21/2018 at 5:24 pm to providerDr. Cliffton AstersWhite, who verbally acknowledged these results. Electronically Signed   By: Signa Kellaylor  Stroud M.D.   On: 10/21/2018 17:24   Ct Abdomen Pelvis W Contrast  Result Date: 10/21/2018 CLINICAL DATA:  Found by family members face down at bottom of steps. Unresponsive. EXAM: CT CHEST, ABDOMEN, AND PELVIS WITH CONTRAST TECHNIQUE: Multidetector CT imaging of the chest, abdomen and pelvis was performed following the standard protocol during bolus administration of intravenous contrast. CONTRAST:  100mL OMNIPAQUE IOHEXOL 300 MG/ML  SOLN COMPARISON:  None. FINDINGS: CT CHEST FINDINGS Cardiovascular: Normal heart size. Increase caliber of the ascending thoracic aorta Measures 4.2 cm, image 55/5. Mediastinum/Nodes: ET tube tip is above the carina. There is a nasogastric tube with tip just below the GE junction. Normal appearance of the thyroid gland. No  enlarged mediastinal or hilar lymph nodes. Lungs/Pleura: No pleural effusion. Bilateral lower lobe atelectasis and airspace consolidation noted right greater than left. Cannot rule out aspiration. No pneumothorax. Musculoskeletal: No chest wall mass or suspicious bone lesions identified. CT ABDOMEN PELVIS FINDINGS Hepatobiliary: No hepatic injury or perihepatic hematoma. Gallbladder is unremarkable Pancreas: Unremarkable. No pancreatic ductal dilatation or surrounding inflammatory changes. Spleen: No splenic injury or perisplenic hematoma. Adrenals/Urinary Tract: No adrenal hemorrhage or renal injury identified. Bladder is diffusely distended. Stomach/Bowel: Stomach is within normal limits. Appendix appears normal. No evidence of bowel wall thickening, distention, or inflammatory changes. Vascular/Lymphatic: No significant vascular findings are present. No enlarged abdominal or pelvic lymph nodes. Reproductive: Prostate is unremarkable. Other: No abdominal wall hernia or abnormality. No abdominopelvic ascites. Musculoskeletal: No acute or significant osseous findings. IMPRESSION: 1. Status post intubation. Bilateral lower lobe airspace opacities and atelectasis concerning for aspiration. No findings to suggest solid organ injury within the chest, abdomen or pelvis. No acute osseous abnormality. 2. Ascending thoracic aorta measures 4.2 cm. Recommend annual imaging followup by CTA or MRA. This recommendation follows 2010 ACCF/AHA/AATS/ACR/ASA/SCA/SCAI/SIR/STS/SVM Guidelines for the Diagnosis and Management of Patients with Thoracic Aortic Disease. Circulation. 2010; 121: Z610-R604: E266-e369. Aortic aneurysm NOS (ICD10-I71.9) Electronically Signed   By: Signa Kellaylor  Stroud M.D.   On: 10/21/2018 17:42   Dg Pelvis Portable  Result Date: 10/21/2018 CLINICAL DATA:  Trauma level 1 fall. Pt found laying prone and unresponsive at the bottom of 15-20 stairs. trauma EXAM: PORTABLE PELVIS 1-2 VIEWS COMPARISON:  None. FINDINGS: There is no  evidence of pelvic fracture or diastasis. No pelvic bone lesions are seen. IMPRESSION: No evidence of pelvic fracture or dislocation. Electronically Signed   By: Genevive BiStewart  Edmunds  M.D.   On: 10/21/2018 16:45   Dg Chest Port 1 View  Result Date: 10/23/2018 CLINICAL DATA:  History of subdural hematoma with recent seizure activity EXAM: PORTABLE CHEST 1 VIEW COMPARISON:  10/22/2018 FINDINGS: Cardiac shadow is at the upper limits of normal in size. The lungs are well aerated bilaterally. No focal infiltrate or sizable effusion is seen. No bony abnormality is noted. Gastric catheter and endotracheal tube have been removed in the interval. IMPRESSION: No acute abnormality noted. Electronically Signed   By: Alcide CleverMark  Lukens M.D.   On: 10/23/2018 08:12   Dg Chest Port 1 View  Result Date: 10/22/2018 CLINICAL DATA:  Check endotracheal tube placement EXAM: PORTABLE CHEST 1 VIEW COMPARISON:  10/21/2018 FINDINGS: Endotracheal tube and gastric catheter are noted. Gastric catheter is again short of its expected location within the stomach. Tip lies within the stomach although the proximal side port remains in the distal esophagus. This should be advanced several cm. The lungs are well aerated bilaterally. Cardiac shadow is stable. IMPRESSION: Gastric catheter with proximal side port in distal esophagus. This should be advanced several cm. No focal infiltrate is seen. Electronically Signed   By: Alcide CleverMark  Lukens M.D.   On: 10/22/2018 07:36   Dg Chest Port 1 View  Result Date: 10/21/2018 CLINICAL DATA:  53 year old male with history of level 1 trauma from a fall. EXAM: PORTABLE CHEST 1 VIEW COMPARISON:  Chest x-ray 04/03/2018. FINDINGS: An endotracheal tube is in place with tip 3.6 cm above the carina. Nasogastric tube with tip in the proximal stomach and side port in the distal third of the esophagus shortly before the gastroesophageal junction. Lung volumes are low. No consolidative airspace disease. No pleural effusions. No  pneumothorax. No pulmonary nodule or mass noted. Pulmonary vasculature and the cardiomediastinal silhouette are within normal limits. IMPRESSION: 1. Support apparatus, as above. 2. Low lung volumes without radiographic evidence of acute cardiopulmonary disease. Electronically Signed   By: Trudie Reedaniel  Entrikin M.D.   On: 10/21/2018 16:44    Anti-infectives: Anti-infectives (From admission, onward)   None       Assessment/Plan Suspected fall down stairs Suicide attempt - patient admitted to intentionally trying to overdose on medications, psych consulted and recommending inpatient psych when medically cleared  TBI/SDH - per Dr. Wynetta Emeryram, F/U head CT stable, TBI therapies [facility] appreciate Dr. Bess HarvestArora's help regarding meds.  Acute hypoxic ventilator dependent respiratory failure - extubated 9/10 and doing well Incidental finding thoracic aortic aneurysm - 4.2cm, needs outpatient F/U EtOH - CIWA  VTE - PAS FEN - reg diet  ID - no current abx  Dispo - transfer to floor, TBI therapies. Inpatient psych when patient medically stable, likely in the next 24-48 hrs.   LOS: 2 days    Wells GuilesKelly Rayburn , [facility] 10/23/2018, 8:31 AM Pager: [phone]

## 2018-10-23 NOTE — Evaluation (Addendum)
Physical Therapy Evaluation Patient Details Name: David Dennis MRN: [id number] DOB: 1965/04/18 Today's Date: 10/23/2018   History of Present Illness  Patient is a 53 y/o male who presents as level 1 trauma after being found at bottom of stairs. GCS 8 on arrival, intubated 9/10. Found to have right SDH with 2-3 mm midline shift, ecchymoses to right orbit and possible aspiration based on CT, possible seizure preceding fall, incidental 4.2 cm thoracic aortic aneurysm. PMH includes CHF, epilepsy, EtOH abuse, depression/anxiety/psychiatric.  Clinical Impression  Patient presents with generalized weakness, elevated BP and impaired mobility s/p above. Pt independent PTA and lives with parents. Reports intentionally overdosing and does not remember falling down the stairs. Mobility limited today secondary to elevated BP despite PO and IV medications. RN aware. BP 160/110 sitting EOB. Tolerated transfers and taking a few steps in room to chair with supervision for lines. Anticipate pt's mobility will improve with increased activity once BP is stable. Will plan for gait training and higher level balance assessment next session as able. Will follow acutely to maximize independence and mobility prior to d/c to inpatient psych.     Follow Up Recommendations No PT follow up;Supervision - Intermittent(inpt psych)    Equipment Recommendations  None recommended by PT    Recommendations for Other Services       Precautions / Restrictions Precautions Precautions: Other (comment) Precaution Comments: SBP <140 Restrictions Weight Bearing Restrictions: No      Mobility  Bed Mobility Overal bed mobility: Needs Assistance Bed Mobility: Supine to Sit     Supine to sit: Modified independent (Device/Increase time);HOB elevated     General bed mobility comments: No assist needed; able to sit straight up in bed into log sitting without difficulties. + dizziness.  Transfers Overall transfer level:  Needs assistance Equipment used: None Transfers: Sit to/from Stand Sit to Stand: Supervision         General transfer comment: for safety and to manage lines.  Ambulation/Gait Ambulation/Gait assistance: Supervision Gait Distance (Feet): 8 Feet Assistive device: None Gait Pattern/deviations: Step-through pattern     General Gait Details: Able to walk around in room short distance to chair but limited due to elevated BP. RN/MD aware. Just given IV meds for blood pressure.  Stairs            Wheelchair Mobility    Modified Rankin (Stroke Patients Only) Modified Rankin (Stroke Patients Only) Pre-Morbid Rankin Score: Slight disability Modified Rankin: Slight disability     Balance Overall balance assessment: Needs assistance Sitting-balance support: Feet supported;No upper extremity supported Sitting balance-Leahy Scale: Good     Standing balance support: During functional activity Standing balance-Leahy Scale: Good Standing balance comment: Able to stand without UE support, no evidence of imbalance for short dynamic activities.                             Pertinent Vitals/Pain Pain Assessment: No/denies pain    Home Living Family/patient expects to be discharged to:: Private residence Living Arrangements: Parent Available Help at Discharge: Family;Available 24 hours/day Type of Home: House Home Access: Stairs to enter Entrance Stairs-Rails: Right Entrance Stairs-Number of Steps: 3 or 7 Home Layout: Two level Home Equipment: None      Prior Function Level of Independence: Independent         Comments: On disability. Drives.     Hand Dominance   Dominant Hand: Right    Extremity/Trunk Assessment  Upper Extremity Assessment Upper Extremity Assessment: Defer to OT evaluation    Lower Extremity Assessment Lower Extremity Assessment: Generalized weakness(from being in the bed)    Cervical / Trunk Assessment Cervical / Trunk  Assessment: Normal  Communication   Communication: HOH  Cognition Arousal/Alertness: Awake/alert Behavior During Therapy: WFL for tasks assessed/performed Overall Cognitive Status: Within Functional Limits for tasks assessed                                        General Comments General comments (skin integrity, edema, etc.): BP 160/110 sitting EOB after being given IV blood pressure meds and PO meds earlier in morning. Pt reports being anxious at baseline anyways.    Exercises     Assessment/Plan    PT Assessment Patient needs continued PT services  PT Problem List Decreased strength;Decreased mobility;Cardiopulmonary status limiting activity;Decreased balance       PT Treatment Interventions Therapeutic activities;Gait training;Therapeutic exercise;Patient/family education;Balance training;Stair training;Functional mobility training    PT Goals (Current goals can be found in the Care Plan section)  Acute Rehab PT Goals Patient Stated Goal: none stated PT Goal Formulation: With patient Time For Goal Achievement: 11/06/18 Potential to Achieve Goals: Fair Additional Goals Additional Goal #1: Pt will score >19/24 on DGI to indicate decreased risk for falls.    Frequency Min 4X/week   Barriers to discharge Inaccessible home environment stairs    Co-evaluation PT/OT/SLP Co-Evaluation/Treatment: Yes Reason for Co-Treatment: For patient/therapist safety;To address functional/ADL transfers(BP)           AM-PAC PT "6 Clicks" Mobility  Outcome Measure Help needed turning from your back to your side while in a flat bed without using bedrails?: None Help needed moving from lying on your back to sitting on the side of a flat bed without using bedrails?: None Help needed moving to and from a bed to a chair (including a wheelchair)?: None Help needed standing up from a chair using your arms (e.g., wheelchair or bedside chair)?: None Help needed to walk in  hospital room?: A Little Help needed climbing 3-5 steps with a railing? : A Little 6 Click Score: 22    End of Session   Activity Tolerance: Patient tolerated treatment well Patient left: in chair;with call bell/phone within reach;with nursing/sitter in room Nurse Communication: Mobility status PT Visit Diagnosis: Muscle weakness (generalized) (M62.81)    Time: 2297-9892 PT Time Calculation (min) (ACUTE ONLY): 20 min   Charges:   PT Evaluation $PT Eval Moderate Complexity: 1 Mod          Wray Kearns, PT, DPT Acute Rehabilitation Services Pager [phone] Office [phone]      David Dennis 10/23/2018, 1:26 PM

## 2018-10-23 NOTE — Progress Notes (Signed)
Nutrition Follow-up  DOCUMENTATION CODES:   Not applicable  INTERVENTION:   - Continue MVI with minerals daily  - Snacks BID between meals  NUTRITION DIAGNOSIS:   Inadequate oral intake related to inability to eat as evidenced by NPO status.  Progressing, pt now on Regular diet  GOAL:   Patient will meet greater than or equal to 90% of their needs  Progressing  MONITOR:   PO intake, Labs, Weight trends, Skin, I & O's  REASON FOR ASSESSMENT:   Ventilator    ASSESSMENT:   David Dennis is an 53 y.o. male. HPI: 83yoM with presumed history of CHF, epilepsy, EtOH abuse, depression/anxiety/psychiatric whom presented as level 1 - was found down at bottom of stairs by his parents per EMS. He was noted to have GCS 8 en route. On arrival, GCS 8; would not follow commands or lie still; combative; he was therefore intubated by ED team. History limited due to this.  Pt admitted with right SDH and seizures.  9/10 - extubated  Noted plan for inpatient psych when medically cleared.  Spoke with pt at bedside. Pt with 100% completed lunch meal tray in addition to 100% completed breakfast meal tray. Pt reports that he has a good appetite a this time.  Pt denies any issues with appetite PTA. Pt states he does not always eat 3 meals daily but does eat when he is hungry (may just be a snack). A meal for him may include cereal with milk or a bagel and cream cheese or takeout.  Pt shares that his UBW is around 180 lbs. Pt states that last year he lost weight down to 150 lbs after a stay at [facility]. Pt believes he was able to gain the weight back. No weight history available in chart.  Pt amenable to receiving snacks between meals to aid in meeting kcal and protein needs during admission. RD to order.  Meal Completion: 100% x 1 recorded meal  Medications reviewed and include: MVI with minerals, Protonix IVF: NS @ 100 ml/hr  Labs reviewed.  UOP: 2155 ml x 24 hours I/O's: +1.5 L  since admit  NUTRITION - FOCUSED PHYSICAL EXAM:    Most Recent Value  Orbital Region  Mild depletion  Upper Arm Region  No depletion  Thoracic and Lumbar Region  No depletion  Buccal Region  No depletion  Temple Region  Mild depletion  Clavicle Bone Region  Mild depletion  Clavicle and Acromion Bone Region  Mild depletion  Scapular Bone Region  No depletion  Dorsal Hand  No depletion  Patellar Region  No depletion  Anterior Thigh Region  No depletion  Posterior Calf Region  No depletion  Edema (RD Assessment)  None  Hair  Reviewed  Eyes  Reviewed  Mouth  Reviewed  Skin  Reviewed  Nails  Reviewed       Diet Order:   Diet Order            Diet regular Room service appropriate? Yes with Assist; Fluid consistency: Thin  Diet effective 1400              EDUCATION NEEDS:   Education needs have been addressed  Skin:  Skin Assessment: Reviewed RN Assessment  Last BM:  no documented BM  Height:   Ht Readings from Last 1 Encounters:  10/21/18 5\' 11"  (1.803 m)    Weight:   Wt Readings from Last 1 Encounters:  10/21/18 93 kg    Ideal Body Weight:  78.2 kg  BMI:  Body mass index is 28.59 kg/m.  Estimated Nutritional Needs:   Kcal:  2200-2400  Protein:  115-130 grams  Fluid:  > 2.0 L    David ReadingKate Jablonski Joshalyn Ancheta, MS, RD, LDN Inpatient Clinical Dietitian Pager: [phone] Weekend/After Hours: [phone]

## 2018-10-23 NOTE — Evaluation (Signed)
Occupational Therapy Evaluation and Discharge Patient Details Name: David Dennis MRN: [id number] DOB: December 05, 1965 Today's Date: 10/23/2018    History of Present Illness Patient is a 53 y/o male who presents as level 1 trauma after being found at bottom of stairs. GCS 8 on arrival, intubated 9/10. Found to have right SDH with 2-3 mm midline shift, ecchymoses to right orbit and possible aspiration based on CT, possible seizure preceding fall, incidental 4.2 cm thoracic aortic aneurysm. PMH includes CHF, epilepsy, EtOH abuse, depression/anxiety/psychiatric.   Clinical Impression   This 53 yo male admitted with above presents to acute OT at an independent level (did have S when up on his feet but only due to lines). No further OT needs, we will sign off.    Follow Up Recommendations  No OT follow up    Equipment Recommendations  None recommended by OT       Precautions / Restrictions Precautions Precaution Comments: SBP <140 Restrictions Weight Bearing Restrictions: No      Mobility Bed Mobility Overal bed mobility: Modified Independent             General bed mobility comments: No assist needed; able to sit straight up in bed into log sitting without difficulties. + dizziness.  Transfers   Equipment used: None Transfers: Sit to/from Stand Sit to Stand: Supervision         General transfer comment: to manage lines.    Balance Overall balance assessment: No apparent balance deficits (not formally assessed)                                         ADL either performed or assessed with clinical judgement   ADL                                         General ADL Comments: Did not get to see pt do alot today to elevated BP, but what he did do (donn socks and get from bed to recliner) he did at an independent level.     Vision Baseline Vision/History: No visual deficits Patient Visual Report: No change from baseline Vision  Assessment?: Yes Eye Alignment: Within Functional Limits Ocular Range of Motion: Within Functional Limits Alignment/Gaze Preference: Within Defined Limits Tracking/Visual Pursuits: Able to track stimulus in all quads without difficulty Saccades: Within functional limits Convergence: Within functional limits            Pertinent Vitals/Pain Pain Assessment: No/denies pain     Hand Dominance Right   Extremity/Trunk Assessment Upper Extremity Assessment Upper Extremity Assessment: Overall WFL for tasks assessed           Communication  No issues   Cognition Arousal/Alertness: Awake/alert Behavior During Therapy: WFL for tasks assessed/performed Overall Cognitive Status: Within Functional Limits for tasks assessed                                                Home Living Family/patient expects to be discharged to:: Private residence Living Arrangements: Parent Available Help at Discharge: Family;Available 24 hours/day Type of Home: House Home Access: Stairs to enter [employer] of Steps: 3 or 7 Entrance Stairs-Rails:  Right Home Layout: Two level Alternate Level Stairs-Number of Steps: 1 flight Alternate Level Stairs-Rails: Right     Bathroom Toilet: Standard     Home Equipment: None      Lives With: Other (Comment);Family(parents)    Prior Functioning/Environment Level of Independence: Independent        Comments: On disability. Drives.                 OT Goals(Current goals can be found in the care plan section) Acute Rehab OT Goals Patient Stated Goal: none stated  OT Frequency:             Co-evaluation PT/OT/SLP Co-Evaluation/Treatment: Yes Reason for Co-Treatment: To address functional/ADL transfers(BP issues)   OT goals addressed during session: ADL's and self-care;Strengthening/ROM      AM-PAC OT "6 Clicks" Daily Activity     Outcome Measure Help from another person eating meals?: None Help from  another person taking care of personal grooming?: None Help from another person toileting, which includes using toliet, bedpan, or urinal?: None Help from another person bathing (including washing, rinsing, drying)?: None Help from another person to put on and taking off regular upper body clothing?: None Help from another person to put on and taking off regular lower body clothing?: None 6 Click Score: 24   End of Session Equipment Utilized During Treatment: (none)  RN was already aware of BP issues and gave him IV meds  Activity Tolerance: Patient tolerated treatment well Patient left: in chair(sitter present)                   Time: [id number] OT Time Calculation (min): 20 min Charges:  OT General Charges $OT Visit: 1 Visit OT Evaluation $OT Eval Moderate Complexity: 1 Mod  David Dennis, David Dennis [facility] Pager [phone] Office [phone]     David Dennis, David Dennis 10/23/2018, 5:14 PM

## 2018-10-23 NOTE — Progress Notes (Signed)
Patient's BP 172/107, PRN Labetalol given and scheduled Coreg.  Brigid Re, PA notified and discontinued IV fluids at this time. Will continue to monitor.

## 2018-10-23 NOTE — Progress Notes (Signed)
Neurology Progress Note   S:// Patient extubated.  Awake alert. Able to provide history Continues to feel confused as to what he will do with his life but said that he was not moving well and that is why he had a fall-he had overdosed on his medications. Psychiatry recommends inpatient psychiatric hospitalization and involuntary committal if patient refuses admission.  O:// Current vital signs: BP (!) 167/108   Pulse 68   Temp 98.7 F (37.1 C) (Oral)   Resp 16   Ht 5\' 11"  (1.803 m)   Wt 93 kg   SpO2 98%   BMI 28.59 kg/m  Vital signs in last 24 hours: Temp:  [97.8 F (36.6 C)-99 F (37.2 C)] 98.7 F (37.1 C) (09/11 0514) Pulse Rate:  [58-74] 68 (09/11 0700) Resp:  [8-29] 16 (09/11 0700) BP: (130-176)/(91-111) 167/108 (09/11 0700) SpO2:  [95 %-99 %] 98 % (09/11 0700) General: Awake alert in no distress HEENT: Normocephalic, right eyelid scrape CVS regular rate rhythm Respiratory: Breathing normally saturating well at room air Extremities warm well perfused Psych: No suicidal ideation, no homicidal ideation, no plans to hurt himself.  Not feeling depressed. Neurological examination  awake alert oriented x3 Speech is clear.  No aphasia. Cranial nerves II to XII within normal limits Motor exam: Symmetric 5/5 all over Sensory: Intact light touch Coordination: Intact coordination without dysmetria Gait testing deferred at this point   Medications  Current Facility-Administered Medications:  .  0.9 %  sodium chloride infusion, , Intravenous, Continuous, Andria MeuseWhite, Christopher M, MD, Last Rate: 100 mL/hr at 10/23/18 0743 .  acetaminophen (TYLENOL) tablet 500 mg, 500 mg, Oral, Q6H PRN, Rayburn, Kelly A, PA-C .  busPIRone (BUSPAR) tablet 10 mg, 10 mg, Oral, BID, Rayburn, Kelly A, PA-C .  carvedilol (COREG) tablet 25 mg, 25 mg, Oral, BID WC, Rayburn, Kelly A, PA-C .  Chlorhexidine Gluconate Cloth 2 % PADS 6 each, 6 each, Topical, Daily, Andria MeuseWhite, Christopher M, MD, 6 each at  10/21/18 2005 .  clevidipine (CLEVIPREX) infusion 0.5 mg/mL, 0-21 mg/hr, Intravenous, Continuous, White, Stephanie Couphristopher M, MD, Stopped at 10/21/18 1850 .  dexmedetomidine (PRECEDEX) 200 MCG/50ML (4 mcg/mL) infusion, 0.4-1.2 mcg/kg/hr, Intravenous, Titrated, Violeta Gelinashompson, Burke, MD, Stopped at 10/23/18 [phone] .  fentaNYL (SUBLIMAZE) injection 25-50 mcg, 25-50 mcg, Intravenous, Q2H PRN, Andria MeuseWhite, Christopher M, MD, 50 mcg at 10/22/18 [id number] .  hydrOXYzine (ATARAX/VISTARIL) tablet 25 mg, 25 mg, Oral, TID PRN, Rayburn, Kelly A, PA-C .  influenza vac split quadrivalent PF (FLUARIX) injection 0.5 mL, 0.5 mL, Intramuscular, Tomorrow-1000, Violeta Gelinashompson, Burke, MD .  levETIRAcetam (KEPPRA) tablet 500 mg, 500 mg, Oral, BID, Rayburn, Kelly A, PA-C .  multivitamin with minerals tablet 1 tablet, 1 tablet, Oral, Daily, Rayburn, Kelly A, PA-C .  OLANZapine (ZYPREXA) tablet 20 mg, 20 mg, Oral, QHS, Rayburn, Kelly A, PA-C .  ondansetron (ZOFRAN-ODT) disintegrating tablet 4 mg, 4 mg, Oral, Q6H PRN **OR** ondansetron (ZOFRAN) injection 4 mg, 4 mg, Intravenous, Q6H PRN, Andria MeuseWhite, Christopher M, MD .  OXcarbazepine (TRILEPTAL) tablet 150 mg, 150 mg, Oral, q morning - 10a, Violeta Gelinashompson, Burke, MD .  Oxcarbazepine (TRILEPTAL) tablet 600 mg, 600 mg, Oral, QPM, Violeta Gelinashompson, Burke, MD, 600 mg at 10/22/18 2114 .  oxyCODONE (Oxy IR/ROXICODONE) immediate release tablet 5-10 mg, 5-10 mg, Oral, Q4H PRN, Rayburn, Kelly A, PA-C .  pantoprazole (PROTONIX) EC tablet 40 mg, 40 mg, Oral, Daily **OR** pantoprazole (PROTONIX) injection 40 mg, 40 mg, Intravenous, Daily, Andria MeuseWhite, Christopher M, MD, 40 mg at 10/22/18 0904 .  pneumococcal 23 valent  vaccine (PNU-IMMUNE) injection 0.5 mL, 0.5 mL, Intramuscular, Tomorrow-1000, Georganna Skeans, MD .  venlafaxine [facility]) tablet 75 mg, 75 mg, Oral, TID WC, Vivia Ewing, PA-C Labs CBC    Component Value Date/Time   WBC 9.1 10/23/2018 0216   RBC 3.62 (L) 10/23/2018 0216   HGB 12.2 (L) 10/23/2018 0216   HCT 35.4 (L)  10/23/2018 0216   PLT 124 (L) 10/23/2018 0216   MCV 97.8 10/23/2018 0216   MCH 33.7 10/23/2018 0216   MCHC 34.5 10/23/2018 0216   RDW 14.8 10/23/2018 0216    CMP     Component Value Date/Time   NA 143 10/23/2018 0216   K 3.6 10/23/2018 0216   CL 112 (H) 10/23/2018 0216   CO2 21 (L) 10/23/2018 0216   GLUCOSE 84 10/23/2018 0216   BUN 9 10/23/2018 0216   CREATININE 0.92 10/23/2018 0216   CALCIUM 8.5 (L) 10/23/2018 0216   PROT 6.3 (L) 10/22/2018 0521   ALBUMIN 3.7 10/22/2018 0521   AST 19 10/22/2018 0521   ALT 15 10/22/2018 0521   ALKPHOS 69 10/22/2018 0521   BILITOT 0.5 10/22/2018 0521   GFRNONAA >60 10/23/2018 0216   GFRAA >60 10/23/2018 0216    Imaging I have reviewed images in epic and the results pertinent to this consultation are: CT head with a 53-year-old that stable.  Assessment: 53 year old veteran with extensive history of schizophrenia anxiety depression prior suicidal attempts presenting as a level 2 trauma after having a fall from staircase Reports he overdosed on his medications and was not moving him and asked why fell. Stat EEG in the emergency room unremarkable. Likely needs more of supportive treatment and psychiatric care. From a seizure perspective-stable  Impression: Drug overdose Fall History of seizures High risk for alcohol withdrawal High risk for self-harm per psychiatry  Recommendations: Continue Keppra 500 twice daily and oxcarbazepine 150 mg in the morning and 600 mg at night Maintain blood pressures below 160-can use labetalol 10 mg as needed every 2 hours.  Resume home medications. Alcohol abuse-CIWA protocol Suicide attempt, history of schizophrenia, anxiety and depression-psychiatry recommends inpatient psychiatric admission.  Management per psychiatry and primary team Neurology will sign off for now and will be available as needed. Plan discussed with Brigid Re of the primary team in the unit Please call us with  questions   -- Amie Portland, MD [facility] Pager: [phone] If 7pm to 7am, please call on call as listed on AMION.  CRITICAL CARE ATTESTATION Performed by: Amie Portland, MD Total critical care time: 30 minutes Critical care time was exclusive of separately billable procedures and treating other patients and/or supervising APPs/Residents/Students Critical care was necessary to treat or prevent imminent or life-threatening deterioration due to seizure, drug overdose  This patient is critically ill and at significant risk for neurological worsening and/or death and care requires constant monitoring. Critical care was time spent personally by me on the following activities: development of treatment plan with patient and/or surrogate as well as nursing, discussions with consultants, evaluation of patient's response to treatment, examination of patient, obtaining history from patient or surrogate, ordering and performing treatments and interventions, ordering and review of laboratory studies, ordering and review of radiographic studies, pulse oximetry, re-evaluation of patient's condition, participation in multidisciplinary rounds and medical decision making of high complexity in the care of this patient.

## 2018-10-24 LAB — BASIC METABOLIC PANEL
Anion gap: 7 (ref 5–15)
BUN: 9 mg/dL (ref 6–20)
CO2: 26 mmol/L (ref 22–32)
Calcium: 9.4 mg/dL (ref 8.9–10.3)
Chloride: 107 mmol/L (ref 98–111)
Creatinine, Ser: 1.14 mg/dL (ref 0.61–1.24)
GFR calc Af Amer: >60 mL/min (ref >60)
GFR calc non Af Amer: >60 mL/min (ref >60)
Glucose, Bld: 119 mg/dL — ABNORMAL HIGH (ref 70–99)
Potassium: 3.4 mmol/L — ABNORMAL LOW (ref 3.5–5.1)
Sodium: 140 mmol/L (ref 135–145)

## 2018-10-24 MED ORDER — AMLODIPINE BESYLATE 5 MG PO TABS
5.0000 mg | ORAL_TABLET | Freq: Every day | ORAL | Status: DC
  Administered 2018-10-24 – 2018-10-25 (×2): 5 mg via ORAL
  Filled 2018-10-24 (×2): qty 1

## 2018-10-24 NOTE — Progress Notes (Addendum)
Physical Therapy Treatment Patient Details Name: David Dennis MRN: [id number] DOB: 1965/04/25 Today's Date: 10/24/2018    History of Present Illness Patient is a 53 y/o male who presents as level 1 trauma after being found at bottom of stairs. GCS 8 on arrival, intubated 9/10. Found to have right SDH with 2-3 mm midline shift, ecchymoses to right orbit and possible aspiration based on CT, possible seizure preceding fall, incidental 4.2 cm thoracic aortic aneurysm. PMH includes CHF, epilepsy, EtOH abuse, depression/anxiety/psychiatric.    PT Comments    Patient seen for mobility progression. Pt overall is mod I/I for mobility and without gait deviations with high level balance activities. Pt reports ambulating around unit X 2 with safety sitter. No c/o dizziness. Pt does report R hip pain with movement. Given pt's current mobility level PT will sign off at this time. Please reorder if needed.  Follow Up Recommendations  No PT follow up;Supervision - Intermittent(inpt psych)     Equipment Recommendations  None recommended by PT    Recommendations for Other Services       Precautions / Restrictions Precautions Precautions: Other (comment) Precaution Comments: SBP <140 Restrictions Weight Bearing Restrictions: No    Mobility  Bed Mobility Overal bed mobility: Independent                Transfers Overall transfer level: Independent Equipment used: None Transfers: Sit to/from [employer] transfer comment: no c/o dizziness or lightheadedness  Ambulation/Gait Ambulation/Gait assistance: Modified independent (Device/Increase time) Gait Distance (Feet): 400 Feet Assistive device: None Gait Pattern/deviations: Step-through pattern     General Gait Details: no significant gait deviations with high level balance activities; slightly guarded with shorter step lengths than likely at baseline given R hip pain particularly through swing phase on R side     Stairs             Wheelchair Mobility    Modified Rankin (Stroke Patients Only) Modified Rankin (Stroke Patients Only) Pre-Morbid Rankin Score: Slight disability Modified Rankin: Slight disability     Balance Overall balance assessment: No apparent balance deficits (not formally assessed) Sitting-balance support: Feet supported;No upper extremity supported Sitting balance-Leahy Scale: Good     Standing balance support: During functional activity;No upper extremity supported Standing balance-Leahy Scale: Good               High level balance activites: Direction changes;Turns;Sudden stops;Head turns;Other (comment)(changes to gait speed)              Cognition Arousal/Alertness: Awake/alert Behavior During Therapy: WFL for tasks assessed/performed Overall Cognitive Status: Within Functional Limits for tasks assessed                                        Exercises      General Comments General comments (skin integrity, edema, etc.): pt educated on use of ice for R hip pain; 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 with sitter today      Pertinent Vitals/Pain Pain Assessment: Faces Faces Pain Scale: Hurts little more Pain Location: R hip Pain Descriptors / Indicators: Sore Pain Intervention(s): Monitored during session    Home Living                      Prior Function  PT Goals (current goals can now be found in the care plan section) Acute Rehab PT Goals Patient Stated Goal: none stated Progress towards PT goals: Goals met/education completed, patient discharged from PT    Frequency    Min 4X/week      PT Plan Current plan remains appropriate    Co-evaluation              AM-PAC PT "6 Clicks" Mobility   Outcome Measure  Help needed turning from your back to your side while in a flat bed without using bedrails?: None Help needed moving from lying on your back to  sitting on the side of a flat bed without using bedrails?: None Help needed moving to and from a bed to a chair (including a wheelchair)?: None Help needed standing up from a chair using your arms (e.g., wheelchair or bedside chair)?: None Help needed to walk in hospital room?: None Help needed climbing 3-5 steps with a railing? : A Little 6 Click Score: 23    End of Session   Activity Tolerance: Patient tolerated treatment well Patient left: with nursing/sitter in room;in bed;with call bell/phone within reach Nurse Communication: Mobility status PT Visit Diagnosis: Muscle weakness (generalized) (M62.81)     Time: 7564-3329 PT Time Calculation (min) (ACUTE ONLY): 10 min  Charges:  $Gait Training: 8-22 mins                     Earney Navy, PTA Acute Rehabilitation Services Pager: [phone] Office: [phone]     Darliss Cheney 10/24/2018, 3:12 PM

## 2018-10-24 NOTE — Progress Notes (Addendum)
CC: Fall downstairs/suicide attempt  Subjective: From medical standpoint he is doing fine.  OT did not recommend any further therapies or follow-up.  Patient is signed off and he needs no further therapies. He is alert and oriented and is currently willing to go to behavioral health for psychiatric inpatient care. Keppra 500 twice daily and oxcarbazepine 150 mg in the morning and 600 mg at night per neurology Objective: Vital signs in last 24 hours: Temp:  [98.6 F (37 C)-99.7 F (37.6 C)] 98.9 F (37.2 C) (09/12 0902) Pulse Rate:  [70-83] 78 (09/12 0902) Resp:  [16-22] 18 (09/12 0902) BP: (148-184)/(95-116) 165/105 (09/12 0902) SpO2:  [96 %-99 %] 96 % (09/12 0902) Last BM Date: 10/23/18 610 p.o. 600 IV 1300 urine BM x1 Hypertensive blood pressure 159/102, 165/105, heart rate 70-80 range Potassium 3.4 creatinine 1.14 WBC yesterday was normal at 9.4 PT is signed off and says he does not need any further therapy for this.    Intake/Output from previous day: 09/11 0701 - 09/12 0700 In: 1210.6 [P.O.:610; I.V.:600.6] Out: 1301 [Urine:1300; Stool:1] Intake/Output this shift: No intake/output data recorded.  General appearance: alert, cooperative and no distress Eyes: conjunctivae/corneas clear. PERRL, EOM's intact. Fundi benign., Ecchymosis around the right eye. Resp: clear to auscultation bilaterally Cardio: regular rate and rhythm, S1, S2 normal, no murmur, click, rub or gallop GI: soft, non-tender; bowel sounds normal; no masses,  no organomegaly Extremities: extremities normal, atraumatic, no cyanosis or edema  Lab Results:  Recent Labs    10/23/18 0216 10/23/18 2044  WBC 9.1 9.4  HGB 12.2* 12.1*  HCT 35.4* 35.1*  PLT 124* 184    BMET Recent Labs    10/23/18 2044 10/24/18 0727  NA 142 140  K 3.0* 3.4*  CL 108 107  CO2 23 26  GLUCOSE 103* 119*  BUN 6 9  CREATININE 0.86 1.14  CALCIUM 9.1 9.4   PT/INR Recent Labs    10/21/18 1605  LABPROT 13.0   INR 1.0    Recent Labs  Lab 10/21/18 1605 10/22/18 0521 10/23/18 2044  AST 28 19 18   ALT 17 15 14   ALKPHOS 83 69 70  BILITOT 0.5 0.5 0.7  PROT 7.5 6.3* 6.8  ALBUMIN 4.4 3.7 3.9     Lipase  No results found for: LIPASE   Medications: . busPIRone  10 mg Oral BID  . carvedilol  25 mg Oral BID WC  . folic acid  1 mg Oral Daily  . levETIRAcetam  500 mg Oral BID  . multivitamin with minerals  1 tablet Oral Daily  . OLANZapine  20 mg Oral QHS  . OXcarbazepine  150 mg Oral q morning - 10a  . OXcarbazepine  600 mg Oral QPM  . pantoprazole  40 mg Oral Daily   Or  . pantoprazole (PROTONIX) IV  40 mg Intravenous Daily  . potassium chloride  20 mEq Oral BID  . thiamine  100 mg Oral Daily   Or  . thiamine  100 mg Intravenous Daily  . venlafaxine  75 mg Oral TID WC   Prior to Admission medications   Medication Sig Start Date End Date Taking? Authorizing Provider  busPIRone (BUSPAR) 10 MG tablet Take 10 mg by mouth 2 (two) times daily.   Yes [provider]  carvedilol (COREG) 25 MG tablet Take 25 mg by mouth 2 (two) times daily with a meal.   Yes [provider]  hydrOXYzine (ATARAX/VISTARIL) 25 MG tablet Take 25 mg  by mouth 3 (three) times daily as needed for itching.   Yes [provider]  levETIRAcetam (KEPPRA) 500 MG tablet Take 500 mg by mouth 2 (two) times daily.   Yes [provider]  Multiple Vitamins-Minerals (MULTIVITAMIN WITH MINERALS) tablet Take 1 tablet by mouth daily.   Yes [provider]  OLANZapine (ZYPREXA) 20 MG tablet Take 20 mg by mouth at bedtime.   Yes [provider]  OXcarbazepine (TRILEPTAL) 150 MG tablet Take 150 mg by mouth daily. Morning dose   Yes [provider]  oxcarbazepine (TRILEPTAL) 600 MG tablet Take 600 mg by mouth every evening.   Yes [provider]  venlafaxine (EFFEXOR) 75 MG tablet Take 75 mg by mouth 3 (three) times daily with meals.   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 Bess HarvestArora's help regarding meds.  Acute hypoxic ventilator dependent respiratory failure- extubated 9/10 and doing well Incidental finding thoracic aortic aneurysm - 4.2cm, needs outpatient F/U EtOH - CIWA Hypertension -Coreg 25 mg twice daily currently - add amlodipine  VTE- PAS FEN- reg diet  ID - no current abx  Dispo-patient stable from a medical standpoint.  I will add some amlodipine to his Coreg.  I paged the social worker to look into transfer to behavioral health.  Awaiting reply from social services. [facility], will try again tomorrow.      LOS: 3 days    Vannesa Abair 10/24/2018 [phone]

## 2018-10-25 MED ORDER — AMLODIPINE BESYLATE 10 MG PO TABS
10.0000 mg | ORAL_TABLET | Freq: Every day | ORAL | Status: DC
  Administered 2018-10-26 – 2018-10-27 (×2): 10 mg via ORAL
  Filled 2018-10-25 (×2): qty 1

## 2018-10-25 NOTE — Progress Notes (Signed)
   Subjective/Chief Complaint: No complaints   Objective: Vital signs in last 24 hours: Temp:  [97.7 F (36.5 C)-98.6 F (37 C)] 98.2 F (36.8 C) (09/13 1036) Pulse Rate:  [60-73] 60 (09/13 1036) Resp:  [13-18] 18 (09/13 1036) BP: (130-170)/(83-117) 170/106 (09/13 1036) SpO2:  [94 %-99 %] 99 % (09/13 1036) Last BM Date: 10/23/18  Intake/Output from previous day: No intake/output data recorded. Intake/Output this shift: Total I/O In: 118 [P.O.:118] Out: -   General appearance: alert, cooperative and no distress Eyes: conjunctivae/corneas clear. PERRL, EOM's intact. Ecchymosis around the right eye. Resp: clear to auscultation bilaterally Cardio: regular rate and rhythm GI: soft, non-tender; bowel sounds normal Extremities: extremities normal, atraumatic, no cyanosis or edema  Lab Results:  Recent Labs    10/23/18 0216 10/23/18 2044  WBC 9.1 9.4  HGB 12.2* 12.1*  HCT 35.4* 35.1*  PLT 124* 184   BMET Recent Labs    10/23/18 2044 10/24/18 0727  NA 142 140  K 3.0* 3.4*  CL 108 107  CO2 23 26  GLUCOSE 103* 119*  BUN 6 9  CREATININE 0.86 1.14  CALCIUM 9.1 9.4   PT/INR No results for input(s): LABPROT, INR in the last 72 hours. ABG No results for input(s): PHART, HCO3 in the last 72 hours.  Invalid input(s): PCO2, PO2  Studies/Results: No results found.  Anti-infectives: Anti-infectives (From admission, onward)   None      Assessment/Plan: Suspected fall down stairs Suicide attempt- patient admitted to intentionally trying to overdose on medications, psych consulted, can go to [facility] whenever bed ready TBI/SDH- per Dr. 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 Johny Chess help regarding meds.  Acute hypoxic ventilator dependent respiratory failure- extubated 9/10 and doing well Incidental finding thoracic aortic aneurysm - 4.2cm, needs outpatient F/U EtOH-  CIWA Hypertension -Coreg 25 mg twice daily currently - add amlodipine VTE- PAS FEN-reg diet ID- no current abx   David Dennis 10/25/2018

## 2018-10-25 NOTE — Plan of Care (Signed)

## 2018-10-26 ENCOUNTER — Other Ambulatory Visit: Payer: Self-pay

## 2018-10-26 LAB — TRIGLYCERIDES: Triglycerides: 88 mg/dL (ref <150)

## 2018-10-26 NOTE — Consult Note (Signed)
Telepsych Consultation   Reason for Consult: "social work says its been longer than 48 hrs since last assessment and needs re-eval [facility]" Referring Physician:  Dr. Violeta GelinasBurke Thompson  Location of Patient: [facility]  Patient Identification: David Dennis MRN:  [id number] Principal Diagnosis: Suicide attempt [facility](HCC) Diagnosis:  Principal Problem:   Suicide attempt [facility](HCC) Active Problems:   SDH (subdural hematoma) (HCC)   Total Time spent with patient: 15 minutes  Subjective:   David Dennis is a 53 y.o. male patient admitted with presumed seizure.  HPI:   Per chart review, patient was admitted with presumed seizure after falling from 15-20 steps on a staircase. He was found face down by a family member. He sustained a subdural hematoma. He required intubation for sedation due to combative behavior and inability to follow commands. He has a history of schizophrenia, depression, anxiety and multiple prior suicide attempts. It is unclear if his fall was accidental versus mechanical versus secondary to drug overdose. EEG was completed and unremarkable for seizures or epileptiform discharges. Home medications include Buspar 10 mg BID, Atarax 25 mg TID PRN, Zyprexa 20 mg qhs, Trileptal 150 mg daily and Effexor 75 mg TID.  Patient was last seen by the psychiatry service on 9/10 and was recommended for inpatient psychiatric hospitalization. On interview, David Dennis reports that his mood is low. He endorses poor appetite in the setting of nausea, vomiting and headache. He reports poor sleep. He denies suicidal thoughts since Wednesday. He denies HI or AVH.    Past Psychiatric History: Alcohol abuse, schizophrenia, depression, anxiety and multiple prior suicide attempts (last overdose was in February).   Risk to Self:  Yes given recent suicide attempt.  Risk to Others:  None. Denies HI.  Prior Inpatient Therapy:  [facility] for suicide [facility] by drug overdose (documented in another chart that has not been merged).  Prior Outpatient Therapy:  [facility]   Past Medical History:  Past Medical History:  Diagnosis Date  . Acute kidney failure (HCC)   . Cardiac arrest (HCC)   . Depression   . Paranoid schizophrenia (HCC)    History reviewed. No pertinent surgical history. Family History: No family history on file. Family Psychiatric  History: Denies  Social History:  Social History   Substance and Sexual Activity  Alcohol Use Yes     Social History   Substance and Sexual Activity  Drug Use Yes    Social History   Socioeconomic History  . Marital status: Single    Spouse name: Not on file  . Number of children: Not on file  . Years of education: Not on file  . Highest education level: Not on file  Occupational History  . Not on file  Social Needs  . Financial resource strain: Not on file  . Food insecurity    Worry: Not on file    Inability: Not on file  . Transportation needs    Medical: Not on file    Non-medical: Not on file  Tobacco Use  . Smoking status: Current Every Day Smoker  . Smokeless tobacco: Never Used  Substance and Sexual Activity  . Alcohol use: Yes  . Drug use: Yes  . Sexual activity: Not on file  Lifestyle  . Physical activity    Days per week: Not on file    Minutes per session: Not  on file  . Stress: Not on file  Relationships  . Social Musician on phone: Not on file    Gets together: Not on file    Attends religious service: Not on file    Active member of club or organization: Not on file    Attends meetings of clubs or organizations: Not on file    Relationship status: Not on file  Other Topics Concern  . Not on file  Social History Narrative  . Not on file   Additional Social History: He lives at home with his parents. He has never been married and does not have children. He receives  disability. He is veteran. He served in [employer].     Allergies:  No Known Allergies  Labs:  Results for orders placed or performed during the hospital encounter of 10/21/18 (from the past 48 hour(s))  Triglycerides     Status: None   Collection Time: 10/26/18  5:24 AM  Result Value Ref Range   Triglycerides 88 <150 mg/dL    Comment: Performed at [facility], [address] [place], [place] [address]    Medications:  Current Facility-Administered Medications  Medication Dose Route Frequency Provider Last Rate Last Dose  . acetaminophen (TYLENOL) tablet 500 mg  500 mg Oral Q6H PRN Rayburn, Kelly A, PA-C   500 mg at 10/25/18 1723  . amLODipine (NORVASC) tablet 10 mg  10 mg Oral Daily Sherrie George, PA-C   10 mg at 10/26/18 1011  . busPIRone (BUSPAR) tablet 10 mg  10 mg Oral BID Rayburn, Kelly A, PA-C   10 mg at 10/26/18 1011  . carvedilol (COREG) tablet 25 mg  25 mg Oral BID WC Rayburn, Kelly A, PA-C   25 mg at 10/26/18 1618  . folic acid (FOLVITE) tablet 1 mg  1 mg Oral Daily Rayburn, Kelly A, PA-C   1 mg at 10/26/18 1011  . hydrALAZINE (APRESOLINE) injection 10 mg  10 mg Intravenous Q6H PRN Violeta Gelinas, MD   10 mg at 10/26/18 1019  . hydrOXYzine (ATARAX/VISTARIL) tablet 25 mg  25 mg Oral TID PRN Rayburn, Kelly A, PA-C      . labetalol (NORMODYNE) injection 10 mg  10 mg Intravenous Q2H PRN Rayburn, Kelly A, PA-C   10 mg at 10/23/18 1831  . levETIRAcetam (KEPPRA) tablet 500 mg  500 mg Oral BID Rayburn, Kelly A, PA-C   500 mg at 10/26/18 1011  . LORazepam (ATIVAN) tablet 1-4 mg  1-4 mg Oral Q1H PRN Rayburn, Alphonsus Sias, PA-C       Or  . LORazepam (ATIVAN) injection 1-4 mg  1-4 mg Intravenous Q1H PRN Rayburn, Kelly A, PA-C      . multivitamin with minerals tablet 1 tablet  1 tablet Oral Daily Rayburn, Kelly A, PA-C   1 tablet at 10/26/18 1011  . OLANZapine (ZYPREXA) tablet 20 mg  20 mg Oral QHS Rayburn, Kelly A, PA-C   20 mg at 10/25/18 2145  . ondansetron (ZOFRAN-ODT)  disintegrating tablet 4 mg  4 mg Oral Q6H PRN Rayburn, Kelly A, PA-C       Or  . ondansetron (ZOFRAN) injection 4 mg  4 mg Intravenous Q6H PRN Rayburn, Alphonsus Sias, PA-C      . OXcarbazepine (TRILEPTAL) tablet 150 mg  150 mg Oral q morning - 10a Rayburn, Kelly A, PA-C   150 mg at 10/26/18 1011  . Oxcarbazepine (TRILEPTAL) tablet 600 mg  600 mg Oral QPM  Rayburn, Claiborne Billings A, PA-C   600 mg at 10/26/18 1618  . oxyCODONE (Oxy IR/ROXICODONE) immediate release tablet 5-10 mg  5-10 mg Oral Q4H PRN Rayburn, Kelly A, PA-C   5 mg at 10/25/18 0058  . pantoprazole (PROTONIX) EC tablet 40 mg  40 mg Oral Daily Rayburn, Kelly A, PA-C   40 mg at 10/26/18 1011  . thiamine (VITAMIN B-1) tablet 100 mg  100 mg Oral Daily Rayburn, Kelly A, PA-C   100 mg at 10/25/18 1027   Or  . thiamine (B-1) injection 100 mg  100 mg Intravenous Daily Rayburn, Kelly A, PA-C      . venlafaxine (EFFEXOR) tablet 75 mg  75 mg Oral TID WC Rayburn, Kelly A, PA-C   75 mg at 10/26/18 1618    Musculoskeletal: Strength & Muscle Tone: No atrophy noted. Gait & Station: UTA since patient is lying in bed. Patient leans: N/A  Psychiatric Specialty Exam: Physical Exam  Nursing note and vitals reviewed. Constitutional: He is oriented to person, place, and time. He appears well-developed and well-nourished.  HENT:  Head: Normocephalic and atraumatic.  Neck: Normal range of motion.  Respiratory: Effort normal.  Musculoskeletal: Normal range of motion.  Neurological: He is alert and oriented to person, place, and time.  Psychiatric: His speech is normal and behavior is normal. Judgment and thought content normal. Cognition and memory are normal. He exhibits a depressed mood.    Review of Systems  Gastrointestinal: Positive for nausea and vomiting.  Neurological: Positive for headaches.  Psychiatric/Behavioral: Positive for depression. Negative for hallucinations and suicidal ideas. The patient has insomnia.   All other systems reviewed and are  negative.   Blood pressure (!) 157/100, pulse 65, temperature 97.8 F (36.6 C), temperature source Oral, resp. rate 16, height 5\' 11"  (1.803 m), weight 93 kg, SpO2 97 %.Body mass index is 28.59 kg/m.  General Appearance: Fairly Groomed, middle aged, Caucasian male, wearing a hospital gown who is lying in bed. NAD.   Eye Contact:  Good  Speech:  Clear and Coherent and Normal Rate  Volume:  Normal  Mood:  Depressed  Affect:  Constricted  Thought Process:  Goal Directed, Linear and Descriptions of Associations: Intact  Orientation:  Full (Time, Place, and Person)  Thought Content:  Logical  Suicidal Thoughts:  No  Homicidal Thoughts:  No  Memory:  Immediate;   Good Recent;   Good Remote;   Good  Judgement:  Fair  Insight:  Fair  Psychomotor Activity:  Normal  Concentration:  Concentration: Good and Attention Span: Good  Recall:  Good  Fund of Knowledge:  Good  Language:  Good  Akathisia:  No  Handed:  Right  AIMS (if indicated):   N/A  Assets:  Communication Skills Desire for Improvement Housing Resilience Social Support  ADL's:  Intact  Cognition:  WNL  Sleep:   Fair   Assessment:  David Dennis is a 53 y.o. male who was admitted with presumed seizure after falling from 15-20 steps on a staircase. He was found face down by a family member. He sustained a subdural hematoma. He required intubation for sedation due to combative behavior and inability to follow commands. He admits to a suicide attempt by polydrug overdose in the setting of depression. He has a history of prior suicide attempts. He continues to warrant inpatient psychiatric hospitalization for stabilization and treatment.   Treatment Plan Summary: -Patient continues to warrant inpatient psychiatric hospitalization given high risk of harm to self. -  Continue bedside sitter.  -Continue home psychiatric medications for mood and anxiety.  -EKG reviewed and QTc 440 on 9/11. Please closely monitor when starting  or increasing QTc prolonging agents.   -Please pursue involuntary commitment if patient refuses voluntary psychiatric hospitalization or attempts to leave the hospital.  -Will sign off on patient at this time. Please consult psychiatry again as needed.    Disposition: Recommend psychiatric Inpatient admission when medically cleared.  This service was provided via telemedicine using a 2-way, interactive audio and video technology.  Names of all persons participating in this telemedicine service and their role in this encounter. Name: Juanetta BeetsJacqueline Fabianna Keats, DO Role: Psychiatrist   Name: David Dennis Role: Patient    Cherly BeachJacqueline J Walter Grima, DO 10/26/2018 4:22 PM

## 2018-10-26 NOTE — Plan of Care (Signed)

## 2018-10-26 NOTE — Progress Notes (Signed)
Patient ID: Georgeanne Nim III, male   DOB: [birth year], 53 y.o.   MRN: [id number]       Subjective: Some HAs and some nausea with emesis, but has been able to still eat some.  Not much appetite this morning.  No blurred vision or other symptoms such as dizziness with standing, etc  Objective: Vital signs in last 24 hours: Temp:  [97.7 F (36.5 C)-98.2 F (36.8 C)] 97.7 F (36.5 C) (09/14 0624) Pulse Rate:  [56-64] 58 (09/14 0624) Resp:  [14-19] 16 (09/14 0624) BP: (140-178)/(91-106) 157/97 (09/14 0624) SpO2:  [95 %-99 %] 97 % (09/14 0624) Last BM Date: 10/26/18  Intake/Output from previous day: 09/13 0701 - 09/14 0700 In: 820 [P.O.:820] Out: -  Intake/Output this shift: No intake/output data recorded.  PE: Gen: NAd, sitting up in bed HEENT: ecchymosis of right periorbital space, PERRL Heart: regular Lungs: CTAB Abd: soft, NT, Nd, +BS Ext: MAE, NVI  Lab Results:  Recent Labs    10/23/18 2044  WBC 9.4  HGB 12.1*  HCT 35.1*  PLT 184   BMET Recent Labs    10/23/18 2044 10/24/18 0727  NA 142 140  K 3.0* 3.4*  CL 108 107  CO2 23 26  GLUCOSE 103* 119*  BUN 6 9  CREATININE 0.86 1.14  CALCIUM 9.1 9.4   PT/INR No results for input(s): LABPROT, INR in the last 72 hours. CMP     Component Value Date/Time   NA 140 10/24/2018 0727   K 3.4 (L) 10/24/2018 0727   CL 107 10/24/2018 0727   CO2 26 10/24/2018 0727   GLUCOSE 119 (H) 10/24/2018 0727   BUN 9 10/24/2018 0727   CREATININE 1.14 10/24/2018 0727   CALCIUM 9.4 10/24/2018 0727   PROT 6.8 10/23/2018 2044   ALBUMIN 3.9 10/23/2018 2044   AST 18 10/23/2018 2044   ALT 14 10/23/2018 2044   ALKPHOS 70 10/23/2018 2044   BILITOT 0.7 10/23/2018 2044   GFRNONAA >60 10/24/2018 0727   GFRAA >60 10/24/2018 0727   Lipase  No results found for: LIPASE     Studies/Results: No results found.  Anti-infectives: Anti-infectives (From admission, onward)   None       Assessment/Plan Suspected fall down  stairs Suicide attempt- patient admitted to intentionally trying to overdose on medications, psych consulted, can go to [facility] whenever bed ready TBI/SDH- per Dr. Cram,F/U head CT stable,6 mm right subdural hematoma9/10/20,TBI therapies.  We discussed possible side effects of TBI such as HAs and N/V SZ disorder- followed at [facility], appreciate Dr. Johny Chess help regarding meds.  Acute hypoxic ventilator dependent respiratory failure- extubated 9/10 and doing well Incidental finding thoracic aortic aneurysm - 4.2cm, needs outpatient F/U EtOH- CIWA Hypertension -Coreg 25 mg twice daily currently-add amlodipine VTE- PAS FEN-reg diet ID- no current abx Dispo - stable for DC to [facility] when bed available   LOS: 5 days    Henreitta Cea , [facility] 10/26/2018, 9:03 AM Pager: [phone]

## 2018-10-27 ENCOUNTER — Inpatient Hospital Stay
Admission: RE | Admit: 2018-10-27 | Discharge: 2018-11-03 | DRG: 885 | Disposition: A | Discharge status: Home / Self Care | Payer: No Typology Code available for payment source | Source: Intra-hospital | Attending: Psychiatry | Admitting: Psychiatry

## 2018-10-27 DIAGNOSIS — Y92009 Unspecified place in unspecified non-institutional (private) residence as the place of occurrence of the external cause: Secondary | ICD-10-CM

## 2018-10-27 DIAGNOSIS — W109XXA Fall (on) (from) unspecified stairs and steps, initial encounter: Secondary | ICD-10-CM | POA: Diagnosis present

## 2018-10-27 DIAGNOSIS — F4010 Social phobia, unspecified: Secondary | ICD-10-CM | POA: Diagnosis present

## 2018-10-27 DIAGNOSIS — T1491XA Suicide attempt, initial encounter: Secondary | ICD-10-CM | POA: Diagnosis present

## 2018-10-27 DIAGNOSIS — F332 Major depressive disorder, recurrent severe without psychotic features: Principal | ICD-10-CM | POA: Diagnosis present

## 2018-10-27 DIAGNOSIS — G4700 Insomnia, unspecified: Secondary | ICD-10-CM | POA: Diagnosis present

## 2018-10-27 DIAGNOSIS — R45851 Suicidal ideations: Secondary | ICD-10-CM | POA: Diagnosis present

## 2018-10-27 DIAGNOSIS — F1010 Alcohol abuse, uncomplicated: Secondary | ICD-10-CM

## 2018-10-27 DIAGNOSIS — I10 Essential (primary) hypertension: Secondary | ICD-10-CM | POA: Diagnosis present

## 2018-10-27 DIAGNOSIS — Z87891 Personal history of nicotine dependence: Secondary | ICD-10-CM

## 2018-10-27 DIAGNOSIS — Z915 Personal history of self-harm: Secondary | ICD-10-CM

## 2018-10-27 DIAGNOSIS — T50912A Poisoning by multiple unspecified drugs, medicaments and biological substances, intentional self-harm, initial encounter: Secondary | ICD-10-CM | POA: Diagnosis present

## 2018-10-27 MED ORDER — AMLODIPINE BESYLATE 10 MG PO TABS: 10.0000 mg | ORAL_TABLET | Freq: Every day | ORAL | Status: DC

## 2018-10-27 MED ORDER — OXYCODONE HCL 5 MG PO TABS: 5.0000 mg | ORAL_TABLET | ORAL | 0 refills | Status: DC | PRN

## 2018-10-27 MED ORDER — POTASSIUM CHLORIDE CRYS ER 20 MEQ PO TBCR
40.0000 meq | EXTENDED_RELEASE_TABLET | Freq: Once | ORAL | Status: AC
  Administered 2018-10-27: 10:00:00 40 meq via ORAL
  Filled 2018-10-27: qty 2

## 2018-10-27 MED ORDER — LISINOPRIL 10 MG PO TABS
10.0000 mg | ORAL_TABLET | Freq: Every day | ORAL | Status: DC
  Administered 2018-10-27: 10:00:00 10 mg via ORAL
  Filled 2018-10-27: qty 1

## 2018-10-27 MED ORDER — ACETAMINOPHEN 500 MG PO TABS: 500.0000 mg | ORAL_TABLET | Freq: Four times a day (QID) | ORAL | 0 refills | Status: DC | PRN

## 2018-10-27 MED ORDER — LISINOPRIL 10 MG PO TABS: 10.0000 mg | ORAL_TABLET | Freq: Every day | ORAL | Status: DC

## 2018-10-27 NOTE — Progress Notes (Signed)
A new admit from [facility] ED who was transferred to [place] on voluntary commitment , patient is alert and oriented x 4 stable and coherent, responding appropriate to assessments, reason for admission patient stated he was found by a family member at the family house where he resides , states that he fell from the stair case 15-20 steps  face down , after consuming large quantities of prescription medications for intentional suicide attempts  and has been drinking alcohol prior to that,patient has past hx. Of schizophrenia, depression and anxiety.  patient has bruises on his right side of face , body search and skin check show no contraband and skin is clean, patient denies any current suicide or homicidal ideations and signs of delusion or hallucinations present at this time , Patient contract for safety , unit safety and expected behaviors are explained, unit orientation is complete , hygiene products are provided , patient is placed in room 322 close to the nurses station for monitoring, dr, Weber Cooks will be the attending.

## 2018-10-27 NOTE — Discharge Summary (Signed)
Patient ID: David ElkWilliam J Ambrosino III [id number] 07/17/1965 53 y.o.  Admit date: 10/21/2018 Discharge date: 10/27/2018  Admitting Diagnosis: 1. Right subdural hematoma with 2-3 mm midline shift 2. Ecchymoses to right orbit 3. Possible aspiration based on CT 4. Possible seizure preceding fall 5. Incidentally found 4.2 cm thoracic aortic aneurysm  Suicide attempt  Discharge Diagnosis Patient Active Problem List   Diagnosis Date Noted  . Suicide attempt (HCC)   . SDH (subdural hematoma) (HCC) 10/21/2018  1. Right subdural hematoma with 2-3 mm midline shift 2. Ecchymoses to right orbit 3. Possible aspiration based on CT 4. Possible seizure preceding fall 5. Incidentally found 4.2 cm thoracic aortic aneurysm  Suicide attempt  Consultants Dr. Wilford CornerArora, neurology Dr. Wynetta Emeryram, NS Dr. Sharma CovertNorman, psych  Reason for Admission: 53yoM with presumed history of CHF, epilepsy, EtOH abuse, depression/anxiety/psychiatric whom presented as level 1 - was found down at bottom of stairs by his parents per EMS. He was noted to have GCS 8 en route. On arrival, GCS 8; would not follow commands or lie still; combative; he was therefore intubated by ED team. History limited due to this.  He comes with bag of medications collected by EMS - including Entresto, Venlafaxine, hydroxyzine, olanzapine, keppra  Procedures none  Hospital Course:  The patient was admitted after being intubated in the ED for combativeness and low GCS.  The initial suspicion was that he may have seized and fell down the steps.  He was found to have a SDH hematoma.  NS was consulted as was neurology for possible seizure.  He was loaded with Keppra.  No acute surgical intervention was warranted for his TBI.  He was able to be extubated and at that time admitted that he overdosed on his meds as a suicide attempt and doesn't recall falling down the steps.  Psych was consulted and the patient was recommended for inpatient psych therapy.  The  patient continued to improve and worked with therapies.  His BP was elevated during his stay.  He was on coreg at home.  Norvasc was added and ultimately lisinopril was started.  HE WILL NEED HIS BMET CHECKED ON 9/17 TO ASSURE CREATININE DOING WELL.  His diet was advanced.  He was felt stable on HD 6 for discharge to an inpatient psychiatric facility.  Physical Exam: Gen: NAD HEENT: ecchymosis healing around right eye Heart: regular Lungs: CTAB Abd: soft, NT, ND, +BS Neuro: NVI  Allergies as of 10/27/2018   No Known Allergies     Medication List    TAKE these medications   acetaminophen 500 MG tablet Commonly known as: TYLENOL Take 1 tablet (500 mg total) by mouth every 6 (six) hours as needed for mild pain.   amLODipine 10 MG tablet Commonly known as: NORVASC Take 1 tablet (10 mg total) by mouth daily. Start taking on: October 28, 2018   busPIRone 10 MG tablet Commonly known as: BUSPAR Take 10 mg by mouth 2 (two) times daily.   carvedilol 25 MG tablet Commonly known as: COREG Take 25 mg by mouth 2 (two) times daily with a meal.   hydrOXYzine 25 MG tablet Commonly known as: ATARAX/VISTARIL Take 25 mg by mouth 3 (three) times daily as needed for itching.   levETIRAcetam 500 MG tablet Commonly known as: KEPPRA Take 500 mg by mouth 2 (two) times daily.   lisinopril 10 MG tablet Commonly known as: ZESTRIL Take 1 tablet (10 mg total) by mouth daily. Start taking on: October 28, 2018  multivitamin with minerals tablet Take 1 tablet by mouth daily.   OLANZapine 20 MG tablet Commonly known as: ZYPREXA Take 20 mg by mouth at bedtime.   OXcarbazepine 150 MG tablet Commonly known as: TRILEPTAL Take 150 mg by mouth daily. Morning dose   oxcarbazepine 600 MG tablet Commonly known as: TRILEPTAL Take 600 mg by mouth every evening.   oxyCODONE 5 MG immediate release tablet Commonly known as: Oxy IR/ROXICODONE Take 1-2 tablets (5-10 mg total) by mouth every 4 (four)  hours as needed (5 mg moderate, 10 mg severe).   venlafaxine 75 MG tablet Commonly known as: EFFEXOR Take 75 mg by mouth 3 (three) times daily with meals.        Follow-up Information    Neurology Follow up.   Why: as needed for history of seizure disorder       [facility] [place] Follow up.   Specialty: General Practice Why: for high blood pressure Contact information: [place] [address] [phone]        Kary Kos, MD Follow up.   Specialty: Neurosurgery Why: as needed secondary to head injury Contact information: [address] [place] [address] [phone]        Meredith Staggers, MD Follow up.   Specialty: Physical Medicine and Rehabilitation Why: their office should call you to follow up for concussion clinic Contact information: [place] [address] [phone]        Cardiothoracic Surgery, Physician, MD Follow up in 1 month(s).   Specialty: Cardiothoracic Surgery Why: Follow up for incidental find of a 4.2cm thoracic aortic aneurysm  Contact information: [place] [address] [phone]           Signed: Saverio Danker, [facility] Surgery 10/27/2018, 2:16 PM Pager: [phone]

## 2018-10-27 NOTE — Progress Notes (Signed)
For transfer to [facility] at [facility]. Report given to [place]. Arrangement made to transport patient via [facility] transport.

## 2018-10-27 NOTE — Discharge Instructions (Signed)
Please check a BMET on 9/17 to follow creatinine after initiation of an ACE inhibitor

## 2018-10-27 NOTE — BH Assessment (Signed)
Patient is to be admitted to [facility] by Psychiatric Nurse Practitioner Priscille Loveless, on the behalf of Dr. Weber Cooks.  Attending Physician will be Dr. Weber Cooks.   Patient has been assigned to room 322, by [place] Call report to [phone].  Representative/Transfer Coordinator is Deanndra Kirley Patient pre-admitted by [facility] Patient Access Gust Rung.)  Marjory Lies., (Social Work) made aware of acceptance   Patient can arrive anytime after 8pm.

## 2018-10-27 NOTE — Progress Notes (Signed)
Patient is to be admitted to [facility] byPsychiatric Nurse PractitionerTakia Starkes, on the behalf of Dr. Weber Cooks.  Attending Physician will be Dr.Clapacs.  Patient has been assigned to [place], by [facility] Charge NurseGwen F. Call report to [phone].    Patient can arrive anytime after 8pm and will need transportation arranged via [place], [place]  [phone]

## 2018-10-27 NOTE — Progress Notes (Signed)
Patient ID: David Dennis, male   DOB: Jan 22, 1966, 53 y.o.   MRN: [id number]       Subjective: No new complaints today.  Slept great last night.  No further N/V.  HA better this am  Objective: Vital signs in last 24 hours: Temp:  [97.6 F (36.4 C)-97.9 F (36.6 C)] 97.9 F (36.6 C) (09/15 0652) Pulse Rate:  [58-66] 66 (09/15 0652) Resp:  [16-22] 16 (09/15 0652) BP: (138-172)/(96-112) 163/108 (09/15 0652) SpO2:  [96 %-98 %] 98 % (09/15 0652) Last BM Date: 10/26/18  Intake/Output from previous day: 09/14 0701 - 09/15 0700 In: 360 [P.O.:360] Out: -  Intake/Output this shift: No intake/output data recorded.  PE: Gen: NAD HEENT: ecchymosis healing around right eye Heart: regular Lungs: CTAB Abd: soft, NT, ND, +BS Neuro: NVI  Lab Results:  No results for input(s): WBC, HGB, HCT, PLT in the last 72 hours. BMET No results for input(s): NA, K, CL, CO2, GLUCOSE, BUN, CREATININE, CALCIUM in the last 72 hours. PT/INR No results for input(s): LABPROT, INR in the last 72 hours. CMP     Component Value Date/Time   NA 140 10/24/2018 0727   K 3.4 (L) 10/24/2018 0727   CL 107 10/24/2018 0727   CO2 26 10/24/2018 0727   GLUCOSE 119 (H) 10/24/2018 0727   BUN 9 10/24/2018 0727   CREATININE 1.14 10/24/2018 0727   CALCIUM 9.4 10/24/2018 0727   PROT 6.8 10/23/2018 2044   ALBUMIN 3.9 10/23/2018 2044   AST 18 10/23/2018 2044   ALT 14 10/23/2018 2044   ALKPHOS 70 10/23/2018 2044   BILITOT 0.7 10/23/2018 2044   GFRNONAA >60 10/24/2018 0727   GFRAA >60 10/24/2018 0727   Lipase  No results found for: LIPASE     Studies/Results: No results found.  Anti-infectives: Anti-infectives (From admission, onward)   None       Assessment/Plan Suspected fall down stairs Suicide attempt- patient admitted to intentionally trying to overdose on medications, psych consulted, can go to [facility] whenever bed ready TBI/SDH- per Dr. Cram,F/U head CT stable,6 mm right subdural  hematoma9/10/20,TBI therapies.  We discussed possible side effects of TBI such as HAs and N/V SZ disorder- followed at [facility], appreciate Dr. Johny Chess help regarding meds.  Acute hypoxic ventilator dependent respiratory failure- extubated 9/10 and doing well Incidental finding thoracic aortic aneurysm - 4.2cm, needs outpatient F/U EtOH- CIWA Hypertension -Coreg 25 mg twice daily currently-add amlodipine, added lisinopril today as well. Follow BMET and Cr VTE- PAS FEN-reg diet, hypokalemia, replace K, check BMET in am ID- no current abx Dispo - stable for DC to [facility] when bed available   LOS: 6 days    Henreitta Cea , [facility] Surgery 10/27/2018, 7:56 AM Pager: [phone]

## 2018-10-28 ENCOUNTER — Other Ambulatory Visit: Payer: Self-pay

## 2018-10-28 DIAGNOSIS — T1491XA Suicide attempt, initial encounter: Secondary | ICD-10-CM

## 2018-10-28 DIAGNOSIS — F4010 Social phobia, unspecified: Secondary | ICD-10-CM

## 2018-10-28 DIAGNOSIS — F1010 Alcohol abuse, uncomplicated: Secondary | ICD-10-CM

## 2018-10-28 DIAGNOSIS — F332 Major depressive disorder, recurrent severe without psychotic features: Secondary | ICD-10-CM | POA: Diagnosis present

## 2018-10-28 MED ORDER — HYDROXYZINE HCL 50 MG PO TABS
50.0000 mg | ORAL_TABLET | Freq: Three times a day (TID) | ORAL | Status: DC | PRN
  Administered 2018-10-29 – 2018-11-02 (×4): 50 mg via ORAL
  Filled 2018-10-28 (×4): qty 1

## 2018-10-28 MED ORDER — VENLAFAXINE HCL ER 75 MG PO CP24
300.0000 mg | ORAL_CAPSULE | Freq: Every day | ORAL | Status: DC
  Administered 2018-10-29 – 2018-11-03 (×6): 300 mg via ORAL
  Filled 2018-10-28 (×6): qty 4

## 2018-10-28 MED ORDER — OLANZAPINE 5 MG PO TABS
15.0000 mg | ORAL_TABLET | Freq: Every day | ORAL | Status: DC
  Administered 2018-10-28 – 2018-11-02 (×6): 15 mg via ORAL
  Filled 2018-10-28 (×6): qty 1

## 2018-10-28 MED ORDER — AMLODIPINE BESYLATE 5 MG PO TABS
10.0000 mg | ORAL_TABLET | Freq: Every day | ORAL | Status: DC
  Administered 2018-10-28 – 2018-11-03 (×7): 10 mg via ORAL
  Filled 2018-10-28 (×7): qty 2

## 2018-10-28 MED ORDER — CARVEDILOL 12.5 MG PO TABS
25.0000 mg | ORAL_TABLET | Freq: Two times a day (BID) | ORAL | Status: DC
  Administered 2018-10-28 – 2018-11-01 (×8): 25 mg via ORAL
  Filled 2018-10-28 (×8): qty 2

## 2018-10-28 MED ORDER — LISINOPRIL 20 MG PO TABS
10.0000 mg | ORAL_TABLET | Freq: Once | ORAL | Status: AC
  Administered 2018-10-28: 08:00:00 10 mg via ORAL
  Filled 2018-10-28: qty 1

## 2018-10-28 MED ORDER — BUSPIRONE HCL 5 MG PO TABS
10.0000 mg | ORAL_TABLET | Freq: Three times a day (TID) | ORAL | Status: DC
  Administered 2018-10-28 – 2018-11-03 (×17): 10 mg via ORAL
  Filled 2018-10-28 (×17): qty 2

## 2018-10-28 MED ORDER — ALUM & MAG HYDROXIDE-SIMETH 200-200-20 MG/5ML PO SUSP: 30.0000 mL | ORAL | Status: DC | PRN

## 2018-10-28 MED ORDER — ACETAMINOPHEN 325 MG PO TABS: 650.0000 mg | ORAL_TABLET | Freq: Four times a day (QID) | ORAL | Status: DC | PRN

## 2018-10-28 MED ORDER — MAGNESIUM HYDROXIDE 400 MG/5ML PO SUSP: 30.0000 mL | Freq: Every day | ORAL | Status: DC | PRN

## 2018-10-28 MED ORDER — TRAZODONE HCL 100 MG PO TABS
100.0000 mg | ORAL_TABLET | Freq: Every evening | ORAL | Status: DC | PRN
  Administered 2018-10-28 – 2018-11-02 (×5): 100 mg via ORAL
  Filled 2018-10-28 (×6): qty 1

## 2018-10-28 NOTE — Progress Notes (Signed)
Recreation Therapy Notes   Date: 10/28/2018  Time: 9:30 am  Location: Craft room  Behavioral response: Appropriate   Intervention Topic: Goals   Discussion/Intervention:  Group content on today was focused on goals. Patients described what goals are and how they define goals. Individuals expressed how they go about setting goals and reaching them. The group identified how important goals are and if they make short term goals to reach long term goals. Patients described how many goals they work on at a time and what affects them not reaching their goal. Individuals described how much time they put into planning and obtaining their goals. The group participated in the intervention "My Goal Board" and made personal goal boards to help them achieve their goal.  Clinical Observations/Feedback:  Patient came to group and defined goals as something you want ans strive for. He explained that he makes goals by first figuring out what he wants. Participant stated that he normally works on 2-3 goals at a time. Patient expressed that in the past his emotions has gotten in the way of him reaching his goals. Individual was social with peers and staff while participating in the intervention.   Keelia Graybill LRT/CTRS          Madilyn Cephas 10/28/2018 11:43 AM

## 2018-10-28 NOTE — BHH Group Notes (Signed)
LCSW Group Therapy Note  10/28/2018 11:35 AM  Type of Therapy/Topic:  Group Therapy:  Emotion Regulation  Participation Level:  None   Description of Group:   The purpose of this group is to assist patients in learning to regulate negative emotions and experience positive emotions. Patients will be guided to discuss ways in which they have been vulnerable to their negative emotions. These vulnerabilities will be juxtaposed with experiences of positive emotions or situations, and patients will be challenged to use positive emotions to combat negative ones. Special emphasis will be placed on coping with negative emotions in conflict situations, and patients will process healthy conflict resolution skills.  Therapeutic Goals: 1. Patient will identify two positive emotions or experiences to reflect on in order to balance out negative emotions 2. Patient will label two or more emotions that they find the most difficult to experience 3. Patient will demonstrate positive conflict resolution skills through discussion and/or role plays  Summary of Patient Progress: Pt was present in group, but did not participate.   Therapeutic Modalities:   Cognitive Behavioral Therapy Feelings Identification Dialectical Behavioral Therapy   Evalina Field, MSW, LCSW Clinical Social Work 10/28/2018 11:35 AM

## 2018-10-28 NOTE — Tx Team (Signed)
Initial Treatment Plan 10/28/2018 12:22 AM [name] [name]:[id number]    PATIENT STRESSORS: Financial difficulties Legal issue Occupational concerns Substance abuse   PATIENT STRENGTHS: Average or above average intelligence Capable of independent living General fund of knowledge Supportive family/friends   PATIENT IDENTIFIED PROBLEMS:   Suicidal ideations by over-dose     Depression/Anxiety    Alcohol miss use           DISCHARGE CRITERIA:  Adequate post-discharge living arrangements Motivation to continue treatment in a less acute level of care Need for constant or close observation no longer present Reduction of life-threatening or endangering symptoms to within safe limits  PRELIMINARY DISCHARGE PLAN: Attend 12-step recovery group Outpatient therapy Participate in family therapy Return to previous living arrangement  PATIENT/FAMILY INVOLVEMENT: This treatment plan has been presented to and reviewed with the patient, David Dennis,   The patient  have been given the opportunity to ask questions and make suggestions.  Clemens Catholic, RN 10/28/2018, 12:22 AM

## 2018-10-28 NOTE — Progress Notes (Signed)
MD on call called back with new medication order for his hypertension, oncoming nurse aware will continue to monitor.

## 2018-10-28 NOTE — BHH Suicide Risk Assessment (Signed)
[place] INPATIENT:  Family/Significant Other Suicide Prevention Education  Suicide Prevention Education:  Patient Refusal for Family/Significant Other Suicide Prevention Education: The patient David Dennis has refused to provide written consent for family/significant other to be provided Family/Significant Other Suicide Prevention Education during admission and/or prior to discharge.  Physician notified.  Dorian Duval T Seneca Gadbois 10/28/2018, 10:01 AM

## 2018-10-28 NOTE — Progress Notes (Signed)
Pt had BP reading 163/97 after BP meds. Continue to monitor. Will pass this along in report. Collier Bullock RN

## 2018-10-28 NOTE — H&P (Signed)
Psychiatric Admission Assessment Adult  Patient Identification: David Dennis MRN:  [id number] Date of Evaluation:  10/28/2018 Chief Complaint:  major depression Principal Diagnosis: Suicidal behavior with attempted self-injury [facility](HCC) Diagnosis:  Principal Problem:   Suicidal behavior with attempted self-injury [facility](HCC) Active Problems:   Suicide attempt (HCC)   Severe recurrent major depression without psychotic features (HCC)   Social anxiety disorder   Alcohol abuse  History of Present Illness: Patient seen chart reviewed.  Patient came [facility] several days ago after being found passed out at the bottom of a flight of stairs obviously having fallen down.  Patient admits that he had taken an overdose of multiple medications including multiple things that he is prescribed for David Dennis psychiatric illness.  He says that at the time he was definitely wishing that he were dead.  He does not remember the fall but it appears to have come after he took the overdose.  Patient continues to endorse suicidal thoughts but without any current intent.  He has chronic depression low mood hopelessness and negative thoughts about himself but emphasizes that he has severe chronic social anxiety.  Feels frightened to be around other people feels very isolated.  He has felt this way David Dennis entire life and has been getting worse with time.  He is prescribed outpatient medication and has been compliant.  He has also however been drinking heavily.  Says he has been drinking about a 12 pack of beer a day.  Denies that he is using any other drugs.  Denies any psychotic symptoms.   Associated Signs/Symptoms: Depression Symptoms:  depressed mood, insomnia, psychomotor retardation, feelings of worthlessness/guilt, difficulty concentrating, recurrent thoughts of death, suicidal attempt, (Hypo) Manic Symptoms:  Impulsivity, Anxiety Symptoms:  Excessive Worry, Social Anxiety, Psychotic Symptoms:  Paranoia, PTSD  Symptoms: Negative Total Time spent with patient: 1 hour  Past Psychiatric History: Patient has a long history of mental illness.  Was first treated when he was in the [employer].  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  Over the years has had several prior hospitalizations.  2 previous suicide attempts.  He [place] David Dennis outpatient care through the [facility] [place] or [place].  He has been assigned therapy many times but has not been able to follow through with it very well.  Relatively recently that he has been drinking as heavily as he has.  No known history of DTs but he may have had alcohol withdrawal seizures in the past.  Is the patient at risk to self? Yes.    Has the patient been a risk to self in the past 6 months? Yes.    Has the patient been a risk to self within the distant past? Yes.    Is the patient a risk to others? No.  Has the patient been a risk to others in the past 6 months? No.  Has the patient been a risk to others within the distant past? No.   Prior Inpatient Therapy:   Prior Outpatient Therapy:    Alcohol Screening: 1. How often do you have a drink containing alcohol?: 4 or more times a week 2. How many drinks containing alcohol do you have on a typical day when you are drinking?: 10 or more 3. How often do you have six or more drinks on one occasion?: Daily or almost daily AUDIT-C Score: 12 4. How often during the last year have you found that you were not  able to stop drinking once you had started?: Daily or almost daily 5. How often during the last year have you failed to do what was normally expected from you becasue of drinking?: Weekly 6. How often during the last year have you needed a first drink in the morning to get yourself going after a heavy drinking session?: Daily or almost daily 7. How often during the last year have you had a feeling of guilt of remorse after drinking?: Weekly 8. How often  during the last year have you been unable to remember what happened the night before because you had been drinking?: Daily or almost daily 9. Have you or someone else been injured as a result of your drinking?: No 10. Has a relative or friend or a doctor or another health worker been concerned about your drinking or suggested you cut down?: Yes, during the last year Alcohol Use Disorder Identification Test Final Score (AUDIT): 34 Alcohol Brief Interventions/Follow-up: Brief Advice, Alcohol Education Substance Abuse History in the last 12 months:  Yes.   Consequences of Substance Abuse: Medical Consequences:  Near fatal passing out from overdose and perhaps seizure Previous Psychotropic Medications: Yes  Psychological Evaluations: Yes  Past Medical History:  Past Medical History:  Diagnosis Date  . Acute kidney failure (HCC)   . Cardiac arrest (HCC)   . Depression   . Paranoid schizophrenia (HCC)    History reviewed. No pertinent surgical history. Family History: History reviewed. No pertinent family history. Family Psychiatric  History: None known Tobacco Screening: Have you used any form of tobacco in the last 30 days? (Cigarettes, Smokeless Tobacco, Cigars, and/or Pipes): No Social History:  Social History   Substance and Sexual Activity  Alcohol Use Yes     Social History   Substance and Sexual Activity  Drug Use Yes    Additional Social History: Marital status: Single Does patient have children?: No                         Allergies:  No Known Allergies Lab Results: No results found for this or any previous visit (from the past 48 hour(s)).  Blood Alcohol level:  Lab Results  Component Value Date   ETH <10 10/21/2018    Metabolic Disorder Labs:  No results found for: HGBA1C, MPG No results found for: PROLACTIN Lab Results  Component Value Date   TRIG 88 10/26/2018    Current Medications: Current Facility-Administered Medications  Medication Dose  Route Frequency Provider Last Rate Last Dose  . acetaminophen (TYLENOL) tablet 650 mg  650 mg Oral Q6H PRN Opha Mcghee T, MD      . alum & mag hydroxide-simeth (MAALOX/MYLANTA) 200-200-20 MG/5ML suspension 30 mL  30 mL Oral Q4H PRN Leontine Radman T, MD      . amLODipine (NORVASC) tablet 10 mg  10 mg Oral Daily Strummer Canipe, Jackquline Denmark, MD   10 mg at 10/28/18 1309  . busPIRone (BUSPAR) tablet 10 mg  10 mg Oral TID Keilyn Nadal T, MD      . carvedilol (COREG) tablet 25 mg  25 mg Oral BID WC Monai Hindes T, MD      . hydrOXYzine (ATARAX/VISTARIL) tablet 50 mg  50 mg Oral TID PRN Amauri Keefe T, MD      . magnesium hydroxide (MILK OF MAGNESIA) suspension 30 mL  30 mL Oral Daily PRN Dyson Sevey T, MD      . OLANZapine (ZYPREXA) tablet 15 mg  15 mg Oral QHS Sowmya Partridge T, MD      . traZODone (DESYREL) tablet 100 mg  100 mg Oral QHS PRN Jie Stickels, Jackquline DenmarkJohn T, MD      . Melene Muller[START ON 10/29/2018] venlafaxine XR (EFFEXOR-XR) 24 hr capsule 300 mg  300 mg Oral Q breakfast Dejia Ebron T, MD       PTA Medications: Medications Prior to Admission  Medication Sig Dispense Refill Last Dose  . acetaminophen (TYLENOL) 500 MG tablet Take 1 tablet (500 mg total) by mouth every 6 (six) hours as needed for mild pain. 30 tablet 0   . amLODipine (NORVASC) 10 MG tablet Take 1 tablet (10 mg total) by mouth daily.     . busPIRone (BUSPAR) 10 MG tablet Take 10 mg by mouth 2 (two) times daily.     . carvedilol (COREG) 25 MG tablet Take 25 mg by mouth 2 (two) times daily with a meal.     . hydrOXYzine (ATARAX/VISTARIL) 25 MG tablet Take 25 mg by mouth 3 (three) times daily as needed for itching.     . levETIRAcetam (KEPPRA) 500 MG tablet Take 500 mg by mouth 2 (two) times daily.     Marland Kitchen. lisinopril (ZESTRIL) 10 MG tablet Take 1 tablet (10 mg total) by mouth daily.     . Multiple Vitamins-Minerals (MULTIVITAMIN WITH MINERALS) tablet Take 1 tablet by mouth daily.     Marland Kitchen. OLANZapine (ZYPREXA) 20 MG tablet Take 20 mg by mouth at bedtime.      . OXcarbazepine (TRILEPTAL) 150 MG tablet Take 150 mg by mouth daily. Morning dose     . oxcarbazepine (TRILEPTAL) 600 MG tablet Take 600 mg by mouth every evening.     Marland Kitchen. oxyCODONE (OXY IR/ROXICODONE) 5 MG immediate release tablet Take 1-2 tablets (5-10 mg total) by mouth every 4 (four) hours as needed (5 mg moderate, 10 mg severe). 30 tablet 0   . venlafaxine (EFFEXOR) 75 MG tablet Take 75 mg by mouth 3 (three) times daily with meals.       Musculoskeletal: Strength & Muscle Tone: within normal limits Gait & Station: normal Patient leans: N/A  Psychiatric Specialty Exam: Physical Exam  Nursing note and vitals reviewed. Cardiovascular: Regular rhythm.  Psychiatric: David Dennis mood appears anxious. David Dennis affect is blunt. David Dennis speech is delayed. He is slowed and withdrawn. Cognition and memory are impaired. He expresses impulsivity. He exhibits a depressed mood. He expresses suicidal ideation. He expresses no suicidal plans.    Review of Systems  Constitutional: Negative.   HENT: Negative.   Eyes: Negative.   Respiratory: Negative.   Cardiovascular: Negative.   Gastrointestinal: Negative.   Musculoskeletal: Negative.   Skin: Negative.   Neurological: Negative.   Psychiatric/Behavioral: Positive for depression, hallucinations, memory loss, substance abuse and suicidal ideas. The patient is nervous/anxious and has insomnia.     Blood pressure (!) 163/112, pulse 65, temperature 98.4 F (36.9 C), temperature source Oral, resp. rate 17, height 6' (1.829 m), weight 79.8 kg, SpO2 99 %.Body mass index is 23.87 kg/m.  General Appearance: Casual  Eye Contact:  Fair  Speech:  Slow  Volume:  Decreased  Mood:  Depressed and Dysphoric  Affect:  Flat  Thought Process:  Coherent  Orientation:  Full (Time, Place, and Person)  Thought Content:  Logical and Paranoid Ideation  Suicidal Thoughts:  Yes.  without intent/plan  Homicidal Thoughts:  No  Memory:  Immediate;   Fair Recent;   Fair Remote;    Fair  Judgement:  Impaired  Insight:  Fair  Psychomotor Activity:  Decreased  Concentration:  Concentration: Fair  Recall:  [employer] of Knowledge:  Fair  Language:  Fair  Akathisia:  No  Handed:  Right  AIMS (if indicated):     Assets:  Desire for Improvement Housing Resilience Social Support  ADL's:  Intact  Cognition:  WNL  Sleep:  Number of Hours: 5    Treatment Plan Summary: Daily contact with patient to assess and evaluate symptoms and progress in treatment, Medication management and Plan This is a patient with an interesting longstanding history of mental illness.  According to the chart he had been diagnosed with schizophrenia in the past.  On interview with the patient he was pretty clear in describing David Dennis past history and it really does not sound much like schizophrenia.  He says he has had periods in the time of being paranoid but has always retained at least some insight about it.  He says currently he gets stuck on the thought that perhaps the police are looking for him but he knows that it is not really true.  What he definitely has a longstanding history of David Dennis depression and anxiety.  Describes very vivid social anxiety disorder and possible OCD.  Has been on multiple medicines down the years never clear if he has been on an adequate dose.  Has had multiple failed attempts at therapy.  Suicidal ideation getting worse probably in tandem with David Dennis drinking more heavily.  Patient has had seizures in the past but according to David Dennis history and what I can glean from the chart it sounds like it is almost completely related to alcohol withdrawal.  I have reviewed David Dennis medicines and David Dennis history and made several recommendations the first of which is that he quit drinking the second of which is that he take advantage of the excellent facilities at the [place] system to get therapy and medication management.  I am going to increase David Dennis dose of Effexor to 300 mg.  Continue the BuSpar.  Discontinue  Keppra and Trileptal as probably unnecessary.  Engage patient in groups on the unit reassess daily make sure he has a good safety plan at discharge  Observation Level/Precautions:  15 minute checks  Laboratory:  UDS  Psychotherapy:    Medications:    Consultations:    Discharge Concerns:    Estimated LOS:  Other:     Physician Treatment Plan for Primary Diagnosis: Suicidal behavior with attempted self-injury ([place]) Long Term Goal(s): Improvement in symptoms so as ready for discharge  Short Term Goals: Ability to disclose and discuss suicidal ideas and Ability to demonstrate self-control will improve  Physician Treatment Plan for Secondary Diagnosis: Principal Problem:   Suicidal behavior with attempted self-injury [facility]) Active Problems:   Suicide attempt ([place])   Severe recurrent major depression without psychotic features ([place])   Social anxiety disorder   Alcohol abuse  Long Term Goal(s): Improvement in symptoms so as ready for discharge  Short Term Goals: Compliance with prescribed medications will improve and Ability to identify triggers associated with substance abuse/mental health issues will improve  I certify that inpatient services furnished can reasonably be expected to improve the patient's condition.    Alethia Berthold, MD 9/16/20205:33 PM

## 2018-10-28 NOTE — BHH Counselor (Signed)
Adult Comprehensive Assessment  Patient ID: David Dennis, male   DOB: [birth year], 53 y.o.   MRN: [id number]  Information Source: Information source: Patient  Current Stressors:  Patient states their primary concerns and needs for treatment are:: Depression, anxiety, SI Patient states their goals for this hospitilization and ongoing recovery are:: More education on how to deal with it and stay on my meds Educational / Learning stressors: None Employment / Job issues: Unemployed, receives [employer] benefit Family Relationships: Close relationship with parents Museum/gallery curator / Lack of resources (include bankruptcy): None Housing / Lack of housing: Stable housing Physical health (include injuries & life threatening diseases): None Social relationships: Pt reports experiencing social anxiety Substance abuse: Hx of alcoholism for past 10 yrs. Pt says for 3 weeks he drank 12 beers per day  Living/Environment/Situation:  Living Arrangements: Parent Who else lives in the home?: Parents How long has patient lived in current situation?: For my whole life What is atmosphere in current home: Comfortable, Supportive  Family History:  Marital status: Single Does patient have children?: No  Childhood History:  By whom was/is the patient raised?: Both parents Description of patient's relationship with caregiver when they were a child: Good Patient's description of current relationship with people who raised him/her: They bend over backward to accomodate me Does patient have siblings?: Yes Number of Siblings: 1 Description of patient's current relationship with siblings: Pt says he has a younger brother Did patient suffer any verbal/emotional/physical/sexual abuse as a child?: No Did patient suffer from severe childhood neglect?: No Has patient ever been sexually abused/assaulted/raped as an adolescent or adult?: No Was the patient ever a victim of a crime or a disaster?: No Witnessed domestic  violence?: No Has patient been effected by domestic violence as an adult?: No  Education:  Highest grade of school patient has completed: 1 yr college Currently a Ship broker?: No Learning disability?: No  Employment/Work Situation:   Employment situation: On disability Why is patient on disability: Pt says he receives VA disability benefit Patient's job has been impacted by current illness: Yes Describe how patient's job has been impacted: Pt says while serving in [employer] he experienced depression and anxiety What is the longest time patient has a held a job?: 3 yrs Where was the patient employed at that time?: [employer] Did You Receive Any Psychiatric Treatment/Services 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 10 year history of alcoholism. Pt denies drug use. If attempted suicide, did drugs/alcohol play a role in this?: No Alcohol/Substance Abuse Treatment Hx: Denies past history Has alcohol/substance abuse ever caused legal problems?: No  Social Support System:   Patient's Community Support System: Fair Describe Community Support System: Parents, brother Type of faith/religion: Darrick Meigs How does patient's faith help to cope with current illness?: I collect myself and I withdraw  Leisure/Recreation:   Leisure and Hobbies: None reported  Strengths/Needs:   What is the patient's perception of their strengths?: I dont know Patient states these barriers may affect/interfere with their treatment: None reported Patient states these  barriers may affect their  return to the community: None reported  Discharge Plan:   Currently receiving community mental health services: Yes (From Whom)([facility]) Patient states concerns and preferences for aftercare planning are[facility] David Dennis [facility], Company secretaryDenise Dennis-social worker and has a psychiatrist Patient states they will know when they are safe and ready for discharge when: I look at things differently and dont get tripped up by some of my problems Does patient have access to transportation?: Yes Does patient have financial barriers related to discharge medications?: No Will patient be returning to same living situation after discharge?: Yes  Summary/Recommendations:   Summary and Recommendations (to be completed by the evaluator): Pt is a 53 yr old male who was brought to the ED due to experiencing worsening depression, anxiety and suicidal ideation. Pt reports an ongoing history of depression and anxiety that he says he has experienced for most of his life. Pt states a history of alcoholism but denies any drug use. [facility] and would like to resume his treatment. While here, patient will benefit from crisis stabilization, medication evaluation, group therapy and psychoeducation. In addition, it is recommended that patient remain compliant with the established discharge plan and continue treatment.  David Dennis David Dennis David Dennis. 10/28/2018

## 2018-10-28 NOTE — Progress Notes (Signed)
Phone call was placed to Dr, Weber Cooks to report high blood pressure of 158/108 and repeat is 157/116, awaiting a call back.

## 2018-10-28 NOTE — Plan of Care (Signed)
Pt rates depression 4/10 and anxiety 6/10. Pt denies SI, HI and AVH. Pt was educated on care plan and verbalizes understanding. Collier Bullock RN Problem: Consults Goal: Concurrent Medical Patient Education Description: (See Patient Education Module for education specifics) Outcome: Progressing   Problem: Education: Goal: Ability to make informed decisions regarding treatment will improve Outcome: Progressing   Problem: Coping: Goal: Coping ability will improve Outcome: Progressing   Problem: Health Behavior/Discharge Planning: Goal: Identification of resources available to assist in meeting health care needs will improve Outcome: Progressing   Problem: Medication: Goal: Compliance with prescribed medication regimen will improve Outcome: Progressing

## 2018-10-28 NOTE — [facility] Suicide Risk Assessment (Signed)
[facility]   Nursing information obtained from:  Patient Demographic factors:  Male, Caucasian, Unemployed Current Mental Status:  NA Loss Factors:  Decline in physical health Historical Factors:  Prior suicide attempts, Impulsivity Risk Reduction Factors:  Positive therapeutic relationship  Total Time spent with patient: 1 hour Principal Problem: Suicidal behavior with attempted self-injury (HCC) Diagnosis:  Principal Problem:   Suicidal behavior with attempted self-injury [facility](HCC) Active Problems:   Suicide attempt (HCC)   Severe recurrent major depression without psychotic features (HCC)   Social anxiety disorder   Alcohol abuse  Subjective Data: Patient seen chart reviewed.  [facility] for an injury suffered falling down a flight of stairs.  Patient reports that he had overdosed with suicidal intent.  Continues to have suicidal ideation without specific intent.  Depressed mood severe anxiety no psychotic symptoms.  Continued Clinical Symptoms:  Alcohol Use Disorder Identification Test Final Score (AUDIT): 34 The "Alcohol Use Disorders Identification Test", Guidelines for Use in Primary Care, Second Edition.  World Science writerHealth Organization [facility](WHO). Score between 0-7:  no or low risk or alcohol related problems. Score between 8-15:  moderate risk of alcohol related problems. Score between 16-19:  high risk of alcohol related problems. Score 20 or above:  warrants further diagnostic evaluation for alcohol dependence and treatment.   CLINICAL FACTORS:   Severe Anxiety and/or Agitation Depression:   Anhedonia Hopelessness Alcohol/Substance Abuse/Dependencies   Musculoskeletal: Strength & Muscle Tone: within normal limits Gait & Station: normal Patient leans: N/A  Psychiatric Specialty Exam: Physical Exam  Nursing note and vitals reviewed. Constitutional: He appears well-developed and well-nourished.   HENT:  Head: Normocephalic and atraumatic.  Eyes: Pupils are equal, round, and reactive to light. Conjunctivae are normal.  Neck: Normal range of motion.  Cardiovascular: Regular rhythm and normal heart sounds.  Respiratory: Effort normal. No respiratory distress.  GI: Soft.  Musculoskeletal: Normal range of motion.  Neurological: He is alert.  Skin: Skin is warm and dry.  Psychiatric: Judgment normal. His affect is blunt. His speech is delayed. He is slowed and withdrawn. Cognition and memory are normal. He expresses suicidal ideation. He expresses no suicidal plans.    Review of Systems  Constitutional: Negative.   HENT: Negative.   Eyes: Negative.   Respiratory: Negative.   Cardiovascular: Negative.   Gastrointestinal: Negative.   Musculoskeletal: Negative.   Skin: Negative.   Neurological: Negative.   Psychiatric/Behavioral: Positive for depression, memory loss, substance abuse and suicidal ideas. Negative for hallucinations. The patient is nervous/anxious and has insomnia.     Blood pressure (!) 163/112, pulse 65, temperature 98.4 F (36.9 C), temperature source Oral, resp. rate 17, height 6' (1.829 m), weight 79.8 kg, SpO2 99 %.Body mass index is 23.87 kg/m.  General Appearance: Casual  Eye Contact:  Fair  Speech:  Clear and Coherent  Volume:  Decreased  Mood:  Anxious and Depressed  Affect:  Congruent  Thought Process:  Goal Directed  Orientation:  Full (Time, Place, and Person)  Thought Content:  Logical  Suicidal Thoughts:  Yes.  without intent/plan  Homicidal Thoughts:  No  Memory:  Immediate;   Fair Recent;   Fair Remote;   Fair  Judgement:  Fair  Insight:  Fair  Psychomotor Activity:  Decreased  Concentration:  Concentration: Fair  Recall:  [employer]Fair  Fund of Knowledge:  Fair  Language:  Fair  Akathisia:  No  Handed:  Right  AIMS (if indicated):  Assets:  Desire for Improvement Housing Resilience  ADL's:  Intact  Cognition:  WNL  Sleep:  Number of  Hours: 5      COGNITIVE FEATURES THAT CONTRIBUTE TO RISK:  Polarized thinking    SUICIDE RISK:   Moderate:  Frequent suicidal ideation with limited intensity, and duration, some specificity in terms of plans, no associated intent, good self-control, limited dysphoria/symptomatology, some risk factors present, and identifiable protective factors, including available and accessible social support.  PLAN OF CARE: Patient will remain on 15-minute checks.  Continue reassessing suicidal ideation.  Discussed with him implementing appropriate plans for treatment of depression and anxiety.  Reassess suicidality prior to discharge and make sure we have stable and safe outpatient plan.  I certify that inpatient services furnished can reasonably be expected to improve the patient's condition.   Alethia Berthold, MD 10/28/2018, 5:30 PM

## 2018-10-29 DIAGNOSIS — T1491XA Suicide attempt, initial encounter: Secondary | ICD-10-CM | POA: Diagnosis not present

## 2018-10-29 MED ORDER — LISINOPRIL 20 MG PO TABS
20.0000 mg | ORAL_TABLET | Freq: Every day | ORAL | Status: DC
  Administered 2018-10-29 – 2018-10-30 (×2): 20 mg via ORAL
  Filled 2018-10-29 (×2): qty 1

## 2018-10-29 NOTE — Progress Notes (Signed)
Recreation Therapy Notes  INPATIENT RECREATION THERAPY ASSESSMENT  Patient Details Name: David Dennis MRN: [id number] DOB: [birth year] Today's Date: 10/29/2018       Information Obtained From: Patient  Able to Participate in Assessment/Interview: Yes  Patient Presentation: Responsive  Reason for Admission (Per Patient): Active Symptoms  Patient Stressors:    Coping Skills:   Talk  Leisure Interests (2+):  (None)  Frequency of Recreation/Participation:    Awareness of Community Resources:     [employer]:     Current Use:    If no, Barriers?:    Expressed Interest in [employer] Information:    [place] of Residence:  [place]  Patient Main Form of Transportation: Musician  Patient Strengths:  N/A  Patient Identified Areas of Improvement:  Feeling better  Patient Goal for Hospitalization:  Success and be confident in self and feel better  Current SI (including self-harm):  No  Current HI:  No  Current AVH: No  Staff Intervention Plan: Group Attendance, Collaborate with Interdisciplinary Treatment Team  Consent to Intern Participation: N/A  Rudi Bunyard 10/29/2018, 3:16 PM

## 2018-10-29 NOTE — Tx Team (Addendum)
Interdisciplinary Treatment and Diagnostic Plan Update  10/29/2018 Time of Session: 9am David Dennis MRN: [id number]  Principal Diagnosis: Suicidal behavior with attempted self-injury [facility])  Secondary Diagnoses: Principal Problem:   Suicidal behavior with attempted self-injury [facility]) Active Problems:   Suicide attempt ([place])   Severe recurrent major depression without psychotic features ([place])   Social anxiety disorder   Alcohol abuse   Current Medications:  Current Facility-Administered Medications  Medication Dose Route Frequency Provider Last Rate Last Dose  . acetaminophen (TYLENOL) tablet 650 mg  650 mg Oral Q6H PRN Clapacs, John T, MD      . alum & mag hydroxide-simeth (MAALOX/MYLANTA) 200-200-20 MG/5ML suspension 30 mL  30 mL Oral Q4H PRN Clapacs, John T, MD      . amLODipine (NORVASC) tablet 10 mg  10 mg Oral Daily Clapacs, Madie Reno, MD   10 mg at 10/29/18 0615  . busPIRone (BUSPAR) tablet 10 mg  10 mg Oral TID Clapacs, Madie Reno, MD   10 mg at 10/29/18 0850  . carvedilol (COREG) tablet 25 mg  25 mg Oral BID WC Clapacs, Madie Reno, MD   25 mg at 10/29/18 0851  . hydrOXYzine (ATARAX/VISTARIL) tablet 50 mg  50 mg Oral TID PRN Clapacs, John T, MD      . magnesium hydroxide (MILK OF MAGNESIA) suspension 30 mL  30 mL Oral Daily PRN Clapacs, John T, MD      . OLANZapine (ZYPREXA) tablet 15 mg  15 mg Oral QHS Clapacs, Madie Reno, MD   15 mg at 10/28/18 2136  . traZODone (DESYREL) tablet 100 mg  100 mg Oral QHS PRN Clapacs, Madie Reno, MD   100 mg at 10/28/18 2139  . venlafaxine XR (EFFEXOR-XR) 24 hr capsule 300 mg  300 mg Oral Q breakfast Clapacs, Madie Reno, MD   300 mg at 10/29/18 0850   PTA Medications: Medications Prior to Admission  Medication Sig Dispense Refill Last Dose  . acetaminophen (TYLENOL) 500 MG tablet Take 1 tablet (500 mg total) by mouth every 6 (six) hours as needed for mild pain. 30 tablet 0   . amLODipine (NORVASC) 10 MG tablet Take 1 tablet (10 mg total) by mouth daily.      . busPIRone (BUSPAR) 10 MG tablet Take 10 mg by mouth 2 (two) times daily.     . carvedilol (COREG) 25 MG tablet Take 25 mg by mouth 2 (two) times daily with a meal.     . hydrOXYzine (ATARAX/VISTARIL) 25 MG tablet Take 25 mg by mouth 3 (three) times daily as needed for itching.     . levETIRAcetam (KEPPRA) 500 MG tablet Take 500 mg by mouth 2 (two) times daily.     Marland Kitchen lisinopril (ZESTRIL) 10 MG tablet Take 1 tablet (10 mg total) by mouth daily.     . Multiple Vitamins-Minerals (MULTIVITAMIN WITH MINERALS) tablet Take 1 tablet by mouth daily.     Marland Kitchen OLANZapine (ZYPREXA) 20 MG tablet Take 20 mg by mouth at bedtime.     . OXcarbazepine (TRILEPTAL) 150 MG tablet Take 150 mg by mouth daily. Morning dose     . oxcarbazepine (TRILEPTAL) 600 MG tablet Take 600 mg by mouth every evening.     Marland Kitchen oxyCODONE (OXY IR/ROXICODONE) 5 MG immediate release tablet Take 1-2 tablets (5-10 mg total) by mouth every 4 (four) hours as needed (5 mg moderate, 10 mg severe). 30 tablet 0   . venlafaxine (EFFEXOR) 75 MG tablet Take 75 mg by mouth  3 (three) times daily with meals.       Patient Stressors: Arts development officer issue Occupational concerns Substance abuse  Patient Strengths: Average or above average intelligence Capable of independent living General fund of knowledge Supportive family/friends  Treatment Modalities: Medication Management, Group therapy, Case management,  1 to 1 session with clinician, Psychoeducation, Recreational therapy.   Physician Treatment Plan for Primary Diagnosis: Suicidal behavior with attempted self-injury [facility]) Long Term Goal(s): Improvement in symptoms so as ready for discharge Improvement in symptoms so as ready for discharge   Short Term Goals: Ability to disclose and discuss suicidal ideas Ability to demonstrate self-control will improve Compliance with prescribed medications will improve Ability to identify triggers associated with substance abuse/mental health  issues will improve  Medication Management: Evaluate patient's response, side effects, and tolerance of medication regimen.  Therapeutic Interventions: 1 to 1 sessions, Unit Group sessions and Medication administration.  Evaluation of Outcomes: Not Met  Physician Treatment Plan for Secondary Diagnosis: Principal Problem:   Suicidal behavior with attempted self-injury [facility]) Active Problems:   Suicide attempt ([place])   Severe recurrent major depression without psychotic features ([place])   Social anxiety disorder   Alcohol abuse  Long Term Goal(s): Improvement in symptoms so as ready for discharge Improvement in symptoms so as ready for discharge   Short Term Goals: Ability to disclose and discuss suicidal ideas Ability to demonstrate self-control will improve Compliance with prescribed medications will improve Ability to identify triggers associated with substance abuse/mental health issues will improve     Medication Management: Evaluate patient's response, side effects, and tolerance of medication regimen.  Therapeutic Interventions: 1 to 1 sessions, Unit Group sessions and Medication administration.  Evaluation of Outcomes: Not Met   RN Treatment Plan for Primary Diagnosis: Suicidal behavior with attempted self-injury ([place]) Long Term Goal(s): Knowledge of disease and therapeutic regimen to maintain health will improve  Short Term Goals: Ability to participate in decision making will improve, Ability to disclose and discuss suicidal ideas, Ability to identify and develop effective coping behaviors will improve and Compliance with prescribed medications will improve  Medication Management: RN will administer medications as ordered by provider, will assess and evaluate patient's response and provide education to patient for prescribed medication. RN will report any adverse and/or side effects to prescribing provider.  Therapeutic Interventions: 1 on 1 counseling sessions,  Psychoeducation, Medication administration, Evaluate responses to treatment, Monitor vital signs and CBGs as ordered, Perform/monitor CIWA, COWS, AIMS and Fall Risk screenings as ordered, Perform wound care treatments as ordered.  Evaluation of Outcomes: Not Met   LCSW Treatment Plan for Primary Diagnosis: Suicidal behavior with attempted self-injury [facility]) Long Term Goal(s): Safe transition to appropriate next level of care at discharge, Engage patient in therapeutic group addressing interpersonal concerns.  Short Term Goals: Engage patient in aftercare planning with referrals and resources  Therapeutic Interventions: Assess for all discharge needs, 1 to 1 time with Social worker, Explore available resources and support systems, Assess for adequacy in community support network, Educate family and significant other(s) on suicide prevention, Complete Psychosocial Assessment, Interpersonal group therapy.  Evaluation of Outcomes: Not Met   Progress in Treatment: Attending groups: Yes. Participating in groups: Yes. Taking medication as prescribed: Yes. Toleration medication: Yes. Family/Significant other contact made: Yes, individual(s) contacted:  pt declined Patient understands diagnosis: Yes. Discussing patient identified problems/goals with staff: Yes. Medical problems stabilized or resolved: No. Denies suicidal/homicidal ideation: No. Issues/concerns per patient self-inventory: No. Other: NA  New problem(s) identified: No, Describe:  none reported  New Short Term/Long Term Goal(s):Attend outpatient treatment, take medication as prescribed, develop and implement healthy coping methods  Patient Goals:  "more comfortable around people and feel better"  Discharge Plan or Barriers: Pt will return home and follow up [facility] the [facility]  Reason for Continuation of Hospitalization: Medication stabilization Suicidal ideation  Estimated Length of Stay:5-7  days  Recreational Therapy: Patient Stressors: N/A  Patient Goal: Patient will identify 3 healthy leisure activities that can be utilized post d/c within 5 recreation therapy group sessions  Attendees: Patient:Jeffrey Kita 10/29/2018 10:06 AM  Physician: Alethia Berthold 10/29/2018 10:06 AM  Nursing: Nat Math [place] 10/29/2018 10:06 AM  RN Care Manager: 10/29/2018 10:06 AM  Social Worker: Sanjuana Kava Olivia Moton [place] 10/29/2018 10:06 AM  Recreational Therapist: Isaias Sakai Kimm Sider 10/29/2018 10:06 AM  Other:  10/29/2018 10:06 AM  Other:  10/29/2018 10:06 AM  Other: 10/29/2018 10:06 AM    Scribe for Treatment Team: Yvette Rack, LCSW 10/29/2018 10:06 AM

## 2018-10-29 NOTE — Progress Notes (Signed)
[facility]  10/29/2018 6:05 PM David ElkWilliam J Licht Dennis  MRN:  [id number] Subjective: Patient seen chart reviewed.  Patient reports he is feeling about the same as yesterday.  Suicidal ideation but no active intent.  Depression and anxiety especially social anxiety ongoing.  No complaints of any side effects of medicine.  Making an effort to attend groups.  No new physical issues Principal Problem: Suicidal behavior with attempted self-injury (HCC) Diagnosis: Principal Problem:   Suicidal behavior with attempted self-injury [facility](HCC) Active Problems:   Suicide attempt (HCC)   Severe recurrent major depression without psychotic features (HCC)   Social anxiety disorder   Alcohol abuse  Total Time spent with patient: 30 minutes  Past Psychiatric History: Past history of anxiety and depression and suicide attempts  Past Medical History:  Past Medical History:  Diagnosis Date  . Acute kidney failure (HCC)   . Cardiac arrest (HCC)   . Depression   . Paranoid schizophrenia (HCC)    History reviewed. No pertinent surgical history. Family History: History reviewed. No pertinent family history. Family Psychiatric  History: See previous Social History:  Social History   Substance and Sexual Activity  Alcohol Use Yes     Social History   Substance and Sexual Activity  Drug Use Yes    Social History   Socioeconomic History  . Marital status: Single    Spouse name: Not on file  . Number of children: Not on file  . Years of education: Not on file  . Highest education level: Not on file  Occupational History  . Not on file  Social Needs  . Financial resource strain: Not on file  . Food insecurity    Worry: Not on file    Inability: Not on file  . Transportation needs    Medical: Not on file    Non-medical: Not on file  Tobacco Use  . Smoking status: Former Games developermoker  . Smokeless tobacco: Never Used  Substance and Sexual Activity  . Alcohol use: Yes  . Drug use: Yes  .  Sexual activity: Not on file  Lifestyle  . Physical activity    Days per week: Not on file    Minutes per session: Not on file  . Stress: Not on file  Relationships  . Social Musicianconnections    Talks on phone: Not on file    Gets together: Not on file    Attends religious service: Not on file    Active member of club or organization: Not on file    Attends meetings of clubs or organizations: Not on file    Relationship status: Not on file  Other Topics Concern  . Not on file  Social History Narrative  . Not on file   Additional Social History:                         Sleep: Fair  Appetite:  Fair  Current Medications: Current Facility-Administered Medications  Medication Dose Route Frequency Provider Last Rate Last Dose  . acetaminophen (TYLENOL) tablet 650 mg  650 mg Oral Q6H PRN Darica Goren T, MD      . alum & mag hydroxide-simeth (MAALOX/MYLANTA) 200-200-20 MG/5ML suspension 30 mL  30 mL Oral Q4H PRN Sharhonda Atwood T, MD      . amLODipine (NORVASC) tablet 10 mg  10 mg Oral Daily Daana Petrasek, Jackquline DenmarkJohn T, MD   10 mg at 10/29/18 0615  . busPIRone (BUSPAR) tablet 10 mg  10 mg Oral TID Jama Krichbaum, Madie Reno, MD   10 mg at 10/29/18 1655  . carvedilol (COREG) tablet 25 mg  25 mg Oral BID WC Moises Terpstra T, MD   25 mg at 10/29/18 1655  . hydrOXYzine (ATARAX/VISTARIL) tablet 50 mg  50 mg Oral TID PRN Aliah Eriksson T, MD      . magnesium hydroxide (MILK OF MAGNESIA) suspension 30 mL  30 mL Oral Daily PRN Ellisa Devivo T, MD      . OLANZapine (ZYPREXA) tablet 15 mg  15 mg Oral QHS Chimere Klingensmith, Madie Reno, MD   15 mg at 10/28/18 2136  . traZODone (DESYREL) tablet 100 mg  100 mg Oral QHS PRN Choua Ikner, Madie Reno, MD   100 mg at 10/28/18 2139  . venlafaxine XR (EFFEXOR-XR) 24 hr capsule 300 mg  300 mg Oral Q breakfast Shabnam Ladd, Madie Reno, MD   300 mg at 10/29/18 0850    Lab Results: No results found for this or any previous visit (from the past 48 hour(s)).  Blood Alcohol level:  Lab Results  Component  Value Date   ETH <10 51/88/4166    Metabolic Disorder Labs: No results found for: HGBA1C, MPG No results found for: PROLACTIN Lab Results  Component Value Date   TRIG 88 10/26/2018    Physical Findings: AIMS: Facial and Oral Movements Muscles of Facial Expression: None, normal Lips and Perioral Area: None, normal Jaw: None, normal Tongue: None, normal,Extremity Movements Upper (arms, wrists, hands, fingers): None, normal Lower (legs, knees, ankles, toes): None, normal, Trunk Movements Neck, shoulders, hips: None, normal, Overall Severity Severity of abnormal movements (highest score from questions above): None, normal Incapacitation due to abnormal movements: None, normal Patient's awareness of abnormal movements (rate only patient's report): No Awareness, Dental Status Current problems with teeth and/or dentures?: No Does patient usually wear dentures?: No  CIWA:  CIWA-Ar Total: 2 COWS:  COWS Total Score: 1  Musculoskeletal: Strength & Muscle Tone: within normal limits Gait & Station: normal Patient leans: N/A  Psychiatric Specialty Exam: Physical Exam  Nursing note and vitals reviewed. Constitutional: He appears well-developed and well-nourished.  HENT:  Head: Normocephalic and atraumatic.  Eyes: Pupils are equal, round, and reactive to light. Conjunctivae are normal.  Neck: Normal range of motion.  Cardiovascular: Regular rhythm and normal heart sounds.  Respiratory: Effort normal. No respiratory distress.  GI: Soft.  Musculoskeletal: Normal range of motion.  Neurological: He is alert.  Skin: Skin is warm and dry.  Psychiatric: His mood appears anxious. His affect is blunt. His speech is delayed. He is slowed and withdrawn. Thought content is paranoid. Cognition and memory are impaired. He expresses impulsivity. He expresses suicidal ideation. He expresses no suicidal plans.    Review of Systems  Constitutional: Negative.   HENT: Negative.   Eyes: Negative.    Respiratory: Negative.   Cardiovascular: Negative.   Gastrointestinal: Negative.   Musculoskeletal: Negative.   Skin: Negative.   Neurological: Negative.   Psychiatric/Behavioral: Positive for depression and suicidal ideas. Negative for hallucinations and substance abuse. The patient is nervous/anxious.     Blood pressure (!) 161/113, pulse 69, temperature 98.9 F (37.2 C), temperature source Oral, resp. rate 17, height 6' (1.829 m), weight 79.8 kg, SpO2 98 %.Body mass index is 23.87 kg/m.  General Appearance: Casual  Eye Contact:  Good  Speech:  Clear and Coherent  Volume:  Normal  Mood:  Euthymic  Affect:  Congruent  Thought Process:  Goal Directed  Orientation:  Full (  Time, Place, and Person)  Thought Content:  Logical  Suicidal Thoughts:  Yes.  without intent/plan  Homicidal Thoughts:  No  Memory:  Immediate;   Fair Recent;   Fair Remote;   Fair  Judgement:  Fair  Insight:  Fair  Psychomotor Activity:  Decreased  Concentration:  Concentration: Fair  Recall:  [employer] of Knowledge:  Fair  Language:  Fair  Akathisia:  No  Handed:  Right  AIMS (if indicated):     Assets:  Desire for Improvement  ADL's:  Intact  Cognition:  WNL  Sleep:  Number of Hours: 8.75     Treatment Plan Summary: Daily contact with patient to assess and evaluate symptoms and progress in treatment, Medication management and Plan Continue current antidepressant medicine.  Gradually plan to increase dose.  Encourage group activity.  Work on making sure that we have some kind of plan for [facility] follow-[facility] at discharge.  Patient's blood pressure continues to run high and will need more medicine for readjustment.  Mordecai Rasmussen, MD 10/29/2018, 6:05 PM

## 2018-10-29 NOTE — Progress Notes (Signed)
Recreation Therapy Notes   Date: 10/29/2018  Time: 9:30 am  Location: Craft room  Behavioral response: Appropriate   Intervention Topic: Problem Solving  Discussion/Intervention:  Group content on today was focused on problem solving. The group described what problem solving is. Patients expressed how problems affect them and how they deal with problems. Individuals identified healthy ways to deal with problems. Patients explained what normally happens to them when they do not deal with problems. The group expressed reoccurring problems for them. The group participated in the intervention "Ways to Solve problems" where patients were given a chance to explore different ways to solve problems.   Clinical Observations/Feedback:  Patient came to group and defined problem solving as finding out what the problem is. He explained that he brain storms when he has a problem. Participant expressed that he could improve his problem solving skills by learning from his past and others. Patient stated that problem solving is important to be productive. Individual was social with peers and staff while participating in the intervention.   Hila Bolding LRT/CTRS         Skii Cleland 10/29/2018 11:05 AM

## 2018-10-29 NOTE — Progress Notes (Signed)
D - Patient was in his room upon arrival to the unit. Patient was pleasant during assessment. Patient denies SI/HI/AVH, pain, anxiety and depression with this Probation officer. Patient active on the milieu this evening and observed interacting appropriately with staff and peers. Patient didn't have any complaints.   A - Patient compliant with medication administration per MD orders and procedures on the unit. Patient isolative to his room this evening. Patient given education. Patient given support and encouragement to be active in his treatment plan. Patient informed to let staff know if there are any issues or problems on the unit.   R - Patient being monitored Q 15 minutes for safety per unit protocol. Patient remains safe on the unit.

## 2018-10-29 NOTE — [facility] Group Notes (Signed)
Balance In Life 10/29/2018 1PM  Type of Therapy/Topic:  Group Therapy:  Balance in Life  Participation Level:  None  Description of Group:   This group will address the concept of balance and how it feels and looks when one is unbalanced. Patients will be encouraged to process areas in their lives that are out of balance and identify reasons for remaining unbalanced. Facilitators will guide patients in utilizing problem-solving interventions to address and correct the stressor making their life unbalanced. Understanding and applying boundaries will be explored and addressed for obtaining and maintaining a balanced life. Patients will be encouraged to explore ways to assertively make their unbalanced needs known to significant others in their lives, using other group members and facilitator for support and feedback.  Therapeutic Goals: 1. Patient will identify two or more emotions or situations they have that consume much of in their lives. 2. Patient will identify signs/triggers that life has become out of balance:  3. Patient will identify two ways to set boundaries in order to achieve balance in their lives:  4. Patient will demonstrate ability to communicate their needs through discussion and/or role plays  Summary of Patient Progress: Pt attended group but no input provided. Pt sat quietly.   Therapeutic Modalities:   Cognitive Behavioral Therapy Solution-Focused Therapy Assertiveness Training  Travares Nelles T Hajira Verhagen, LCSW  

## 2018-10-29 NOTE — Plan of Care (Signed)
Patient compliant with medication administration this evening.   Problem: Medication: Goal: Compliance with prescribed medication regimen will improve Outcome: Progressing   

## 2018-10-29 NOTE — Progress Notes (Signed)
D - Patient was in his room upon arrival to the unit. Patient was pleasant during assessment. Patient denies SI/HI/AVH, pain, anxiety and depression with this Probation officer. Patient was isolative to his room this evening. Patient didn't have any complaints, patient stated he just wanted to rest in his bed.   A - Patient compliant with medication administration per MD orders and procedures on the unit. Patient isolative to his room this evening. Patient given education. Patient given support and encouragement to be active in his treatment plan. Patient informed to let staff know if there are any issues or problems on the unit.   R - Patient being monitored Q 15 minutes for safety per unit protocol. Patient remains safe on the unit.

## 2018-10-29 NOTE — Plan of Care (Signed)
D- Patient alert and oriented. Patient presents in a pleasant mood on assessment stating that he slept "ok" last night and had no major complaints to voice to this Probation officer. Patient endorsed depression and anxiety, reporting that "it's frightening being here", which is why he's feeling this way. Patient denies SI, HI, AVH, and pain at this time. Patient's goal for today is to "relax", in which he will "think" in order to achieve his goal.  A- Scheduled medications administered to patient, per MD orders. Support and encouragement provided.  Routine safety checks conducted every 15 minutes.  Patient informed to notify staff with problems or concerns.  R- No adverse drug reactions noted. Patient contracts for safety at this time. Patient compliant with medications and treatment plan. Patient receptive, calm, and cooperative. Patient interacts well with others on the unit.  Patient remains safe at this time.  Problem: Consults Goal: Concurrent Medical Patient Education Description: (See Patient Education Module for education specifics) Outcome: Progressing   Problem: [facility] Concurrent Medical Problem Goal: LTG-Pt will be physically stable and he/significant other Description: (Patient will be physically stable and he/significant other will be able to verbalize understanding of follow-up care and symptoms that would warrant further treatment) Outcome: Progressing Goal: STG-Vital signs will be within defined limits or stabilized Description: (STG- Vital signs will be within defined limits or stabilized for individual) Outcome: Progressing Goal: STG-Compliance with medication and/or treatment as ordered Description: (STG-Compliance with medication and/or treatment as ordered by MD) Outcome: Progressing Goal: STG-Verbalize two symptoms that would warrant further Description: (STG-Verbalize two symptoms that would warrant further treatment) Outcome: Progressing Goal: STG-Patient will participate in  management/stabilization Description: (STG-Patient will participate in management/stabilization of medical condition) Outcome: Progressing Goal: STG-Other (Specify): Description: STG-Other Concurrent Medical (Specify): Outcome: Progressing   Problem: Education: Goal: Ability to make informed decisions regarding treatment will improve Outcome: Progressing   Problem: Coping: Goal: Coping ability will improve Outcome: Progressing   Problem: Health Behavior/Discharge Planning: Goal: Identification of resources available to assist in meeting health care needs will improve Outcome: Progressing   Problem: Medication: Goal: Compliance with prescribed medication regimen will improve Outcome: Progressing   Problem: Self-Concept: Goal: Ability to disclose and discuss suicidal ideas will improve Outcome: Progressing Goal: Will verbalize positive feelings about self Outcome: Progressing

## 2018-10-29 NOTE — Plan of Care (Signed)
Patient compliant with medication administration per MD orders  Problem: Medication: Goal: Compliance with prescribed medication regimen will improve Outcome: Progressing   

## 2018-10-30 DIAGNOSIS — T1491XA Suicide attempt, initial encounter: Secondary | ICD-10-CM | POA: Diagnosis not present

## 2018-10-30 MED ORDER — LISINOPRIL 20 MG PO TABS
30.0000 mg | ORAL_TABLET | Freq: Every day | ORAL | Status: DC
  Administered 2018-10-31 – 2018-11-03 (×4): 30 mg via ORAL
  Filled 2018-10-30 (×4): qty 2

## 2018-10-30 NOTE — [facility] Group Notes (Signed)
LCSW Group Therapy Note  10/30/2018 10:00 AM  Type of Therapy and Topic:  Group Therapy:  Feelings around Relapse and Recovery  Participation Level:  Active   Description of Group:    Patients in this group will discuss emotions they experience before and after a relapse. They will process how experiencing these feelings, or avoidance of experiencing them, relates to having a relapse. Facilitator will guide patients to explore emotions they have related to recovery. Patients will be encouraged to process which emotions are more powerful. They will be guided to discuss the emotional reaction significant others in their lives may have to their relapse or recovery. Patients will be assisted in exploring ways to respond to the emotions of others without this contributing to a relapse.  Therapeutic Goals: 1. Patient will identify two or more emotions that lead to a relapse for them 2. Patient will identify two emotions that result when they relapse 3. Patient will identify two emotions related to recovery 4. Patient will demonstrate ability to communicate their needs through discussion and/or role plays   Summary of Patient Progress: Patient was present in group and attentive. Patient was supportive of other group members. Patient engaged in group discussions and discussed how "lack of a resolution" has been a trigger for him in the past. He also shared how relapsing can lead to an increase in anxiety and depression.     Therapeutic Modalities:   Cognitive Behavioral Therapy Solution-Focused Therapy Assertiveness Training Relapse Prevention Therapy   Assunta Curtis, MSW, LCSW 10/30/2018 9:40 AM

## 2018-10-30 NOTE — Progress Notes (Signed)
Pt has had anxiety today and needed PRNs. Collier Bullock RN

## 2018-10-30 NOTE — Progress Notes (Signed)
[facility] MD Progress Note  10/30/2018 3:37 PM David Dennis  MRN:  [id number] Subjective: Follow-up for this gentleman with recent suicide attempt related to severe depression and anxiety.  Patient continues to describe social anxiety as the largest element of his depression.  Despite this he is managing to go to groups and does not appear to be completely overwhelmed.  He does still have suicidal thoughts without any specific plan.  Blood pressure has been running high despite more than one blood pressure agent.  No complaints however. Principal Problem: Suicidal behavior with attempted self-injury [facility]) Diagnosis: Principal Problem:   Suicidal behavior with attempted self-injury [facility]) Active Problems:   Suicide attempt ([place])   Severe recurrent major depression without psychotic features ([place])   Social anxiety disorder   Alcohol abuse  Total Time spent with patient: 30 minutes  Past Psychiatric History: Patient has a history of depression and more than 1 prior suicide attempt  Past Medical History:  Past Medical History:  Diagnosis Date  . Acute kidney failure ([place])   . Cardiac arrest ([place])   . Depression   . Paranoid schizophrenia ([place])    History reviewed. No pertinent surgical history. Family History: History reviewed. No pertinent family history. Family Psychiatric  History: See previous Social History:  Social History   Substance and Sexual Activity  Alcohol Use Yes     Social History   Substance and Sexual Activity  Drug Use Yes    Social History   Socioeconomic History  . Marital status: Single    Spouse name: Not on file  . Number of children: Not on file  . Years of education: Not on file  . Highest education level: Not on file  Occupational History  . Not on file  Social Needs  . Financial resource strain: Not on file  . Food insecurity    Worry: Not on file    Inability: Not on file  . Transportation needs    Medical: Not on file    Non-medical:  Not on file  Tobacco Use  . Smoking status: Former Research scientist (life sciences)  . Smokeless tobacco: Never Used  Substance and Sexual Activity  . Alcohol use: Yes  . Drug use: Yes  . Sexual activity: Not on file  Lifestyle  . Physical activity    Days per week: Not on file    Minutes per session: Not on file  . Stress: Not on file  Relationships  . Social Herbalist on phone: Not on file    Gets together: Not on file    Attends religious service: Not on file    Active member of club or organization: Not on file    Attends meetings of clubs or organizations: Not on file    Relationship status: Not on file  Other Topics Concern  . Not on file  Social History Narrative  . Not on file   Additional Social History:                         Sleep: Fair  Appetite:  Fair  Current Medications: Current Facility-Administered Medications  Medication Dose Route Frequency Provider Last Rate Last Dose  . acetaminophen (TYLENOL) tablet 650 mg  650 mg Oral Q6H PRN Sofie Schendel T, MD      . alum & mag hydroxide-simeth (MAALOX/MYLANTA) 200-200-20 MG/5ML suspension 30 mL  30 mL Oral Q4H PRN Jiya Kissinger, Madie Reno, MD      .  amLODipine (NORVASC) tablet 10 mg  10 mg Oral Daily Amarius Toto, Jackquline Denmark, MD   10 mg at 10/30/18 0636  . busPIRone (BUSPAR) tablet 10 mg  10 mg Oral TID Andersen Iorio, Jackquline Denmark, MD   10 mg at 10/30/18 1206  . carvedilol (COREG) tablet 25 mg  25 mg Oral BID WC Simpson Paulos, Jackquline Denmark, MD   25 mg at 10/30/18 5885  . hydrOXYzine (ATARAX/VISTARIL) tablet 50 mg  50 mg Oral TID PRN Eljay Lave, Jackquline Denmark, MD   50 mg at 10/29/18 2116  . [START ON 10/31/2018] lisinopril (ZESTRIL) tablet 30 mg  30 mg Oral Daily Tahje Borawski T, MD      . magnesium hydroxide (MILK OF MAGNESIA) suspension 30 mL  30 mL Oral Daily PRN Kimanh Templeman T, MD      . OLANZapine (ZYPREXA) tablet 15 mg  15 mg Oral QHS Adis Sturgill, Jackquline Denmark, MD   15 mg at 10/29/18 2116  . traZODone (DESYREL) tablet 100 mg  100 mg Oral QHS PRN Jermall Isaacson, Jackquline Denmark, MD   100  mg at 10/29/18 2116  . venlafaxine XR (EFFEXOR-XR) 24 hr capsule 300 mg  300 mg Oral Q breakfast Perina Salvaggio, Jackquline Denmark, MD   300 mg at 10/30/18 0809    Lab Results: No results found for this or any previous visit (from the past 48 hour(s)).  Blood Alcohol level:  Lab Results  Component Value Date   ETH <10 10/21/2018    Metabolic Disorder Labs: No results found for: HGBA1C, MPG No results found for: PROLACTIN Lab Results  Component Value Date   TRIG 88 10/26/2018    Physical Findings: AIMS: Facial and Oral Movements Muscles of Facial Expression: None, normal Lips and Perioral Area: None, normal Jaw: None, normal Tongue: None, normal,Extremity Movements Upper (arms, wrists, hands, fingers): None, normal Lower (legs, knees, ankles, toes): None, normal, Trunk Movements Neck, shoulders, hips: None, normal, Overall Severity Severity of abnormal movements (highest score from questions above): None, normal Incapacitation due to abnormal movements: None, normal Patient's awareness of abnormal movements (rate only patient's report): No Awareness, Dental Status Current problems with teeth and/or dentures?: No Does patient usually wear dentures?: No  CIWA:  CIWA-Ar Total: 2 COWS:  COWS Total Score: 1  Musculoskeletal: Strength & Muscle Tone: within normal limits Gait & Station: normal Patient leans: N/A  Psychiatric Specialty Exam: Physical Exam  Nursing note and vitals reviewed. Constitutional: He appears well-developed and well-nourished.  HENT:  Head: Normocephalic and atraumatic.  Eyes: Pupils are equal, round, and reactive to light. Conjunctivae are normal.  Neck: Normal range of motion.  Cardiovascular: Regular rhythm and normal heart sounds.  Respiratory: Effort normal.  GI: Soft.  Musculoskeletal: Normal range of motion.  Neurological: He is alert.  Skin: Skin is warm and dry.  Psychiatric: Judgment normal. His mood appears anxious. His speech is delayed. He is slowed.  Thought content is not paranoid. Cognition and memory are normal. He exhibits a depressed mood. He expresses suicidal ideation. He expresses no homicidal ideation. He expresses no suicidal plans.    ROS  Blood pressure (!) 136/106, pulse 72, temperature 97.8 F (36.6 C), temperature source Oral, resp. rate 17, height 6' (1.829 m), weight 79.8 kg, SpO2 99 %.Body mass index is 23.87 kg/m.  General Appearance: Casual  Eye Contact:  Good  Speech:  Clear and Coherent  Volume:  Normal  Mood:  Euthymic  Affect:  Constricted  Thought Process:  Coherent  Orientation:  Full (Time, Place, and Person)  Thought Content:  Logical  Suicidal Thoughts:  No  Homicidal Thoughts:  No  Memory:  Immediate;   Fair Recent;   Fair Remote;   Fair  Judgement:  Fair  Insight:  Fair  Psychomotor Activity:  Normal  Concentration:  Concentration: Fair  Recall:  [employer]Fair  Fund of Knowledge:  Fair  Language:  Fair  Akathisia:  No  Handed:  Right  AIMS (if indicated):     Assets:  Desire for Improvement Housing Physical Health  ADL's:  Intact  Cognition:  WNL  Sleep:  Number of Hours: 8.15     Treatment Plan Summary: Daily contact with patient to assess and evaluate symptoms and progress in treatment, Medication management and Plan Continue current dosing with higher amount of Effexor.  Increased dose of lisinopril to 30 mg.  Continue to encourage groups.  Discussed with him and explained the mechanism of cognitive behavioral change for anxiety.  Possible discharge early this upcoming week.  Mordecai RasmussenJohn Dyneshia Baccam, MD 10/30/2018, 3:37 PM

## 2018-10-30 NOTE — Plan of Care (Signed)
Patient compliant with medication administration per MD orders  Problem: Medication: Goal: Compliance with prescribed medication regimen will improve Outcome: Progressing   

## 2018-10-30 NOTE — Progress Notes (Addendum)
Recreation Therapy Notes   Date: 10/30/2018  Time: 1:00pm   Location: Outside  Behavioral response: Appropriate   Intervention Topic: Leisure  Discussion/Intervention:  Group content today was focused on leisure. The group defined what leisure is and some positive leisure activities they participate in. Individuals identified the difference between good and bad leisure. Participants expressed how they feel after participating in the leisure of their choice. The group discussed how they go about picking a leisure activity and if others are involved in their leisure activities. The patient stated how many leisure activities they too choose from and reasons why it is important to have leisure time. Individuals participated in the intervention "Exploration of Leisure" where they had a chance to identify new leisure activities as well as benefits of leisure.  Clinical Observations/Feedback:  Patient came group and was focused on what peers and staff had to say about leisure. Individual was social with peers and staff while participating in the intervention.   Zed Wanninger LRT/CTRS         Aleks Nawrot 10/30/2018 2:15 PM

## 2018-10-30 NOTE — Plan of Care (Signed)
Pt rates depression 3/10 and anxiety 4/10. Pt denies SI, HI and AVH. Pt was educated on care plan and verbalizes understanding. Collier Bullock RN Problem: Consults Goal: Concurrent Medical Patient Education Description: (See Patient Education Module for education specifics) Outcome: Progressing   Problem: [facility] Concurrent Medical Problem Goal: LTG-Pt will be physically stable and he/significant other Description: (Patient will be physically stable and he/significant other will be able to verbalize understanding of follow-up care and symptoms that would warrant further treatment) Outcome: Progressing Goal: STG-Vital signs will be within defined limits or stabilized Description: (STG- Vital signs will be within defined limits or stabilized for individual) Outcome: Progressing Goal: STG-Compliance with medication and/or treatment as ordered Description: (STG-Compliance with medication and/or treatment as ordered by MD) Outcome: Progressing Goal: STG-Verbalize two symptoms that would warrant further Description: (STG-Verbalize two symptoms that would warrant further treatment) Outcome: Progressing Goal: STG-Patient will participate in management/stabilization Description: (STG-Patient will participate in management/stabilization of medical condition) Outcome: Progressing Goal: STG-Other (Specify): Description: STG-Other Concurrent Medical (Specify): Outcome: Progressing   Problem: Education: Goal: Ability to make informed decisions regarding treatment will improve Outcome: Progressing   Problem: Coping: Goal: Coping ability will improve Outcome: Progressing   Problem: Health Behavior/Discharge Planning: Goal: Identification of resources available to assist in meeting health care needs will improve Outcome: Progressing   Problem: Medication: Goal: Compliance with prescribed medication regimen will improve Outcome: Progressing   Problem: Self-Concept: Goal: Ability to disclose and  discuss suicidal ideas will improve Outcome: Progressing Goal: Will verbalize positive feelings about self Outcome: Progressing

## 2018-10-30 NOTE — Progress Notes (Signed)
D - Patient was in his room upon arrival to the unit. Patient was pleasant during assessment. Patient denies SI/HI/AVH, pain, anxiety and depression with this Probation officer. Patient active on the milieu this evening and observed interacting appropriately with staff and peers. Patient didn't have any complaints. Patient more active on the unit this evening. Patient more talkative with patients and staff.   A - Patient compliant with medication administration per MD orders and procedures on the unit. Patient isolative to his room this evening. Patient given education. Patient given support and encouragement to be active in his treatment plan. Patient informed to let staff know if there are any issues or problems on the unit.   R - Patient being monitored Q 15 minutes for safety per unit protocol. Patient remains safe on the unit.

## 2018-10-31 NOTE — Progress Notes (Signed)
[facility] MD Progress Note  10/31/2018 10:21 AM David Dennis  MRN:  [id number] Subjective:    Patient seen he speaks in general terms about being overwhelmed by life, other people, seeking social isolation.  This propels his suicidal thoughts by his report but he can contract for safety here.  Meds are discussed he has no particular questions has heard the risks benefits and side effects before  Does engage in interview process no involuntary movements blood pressure still up despite beta-blocker.  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 Acute kidney failure (HCC)   . Cardiac arrest (HCC)   . Depression   . Paranoid schizophrenia (HCC)    History reviewed. No pertinent surgical history. Family History: History reviewed. No pertinent family history. Family Psychiatric  History: No new data Social History:  Social History   Substance and Sexual Activity  Alcohol Use Yes     Social History   Substance and Sexual Activity  Drug Use Yes    Social History   Socioeconomic History  . Marital status: Single    Spouse name: Not on file  . Number of children: Not on file  . Years of education: Not on file  . Highest education level: Not on file  Occupational History  . Not on file  Social Needs  . Financial resource strain: Not on file  . Food insecurity    Worry: Not on file    Inability: Not on file  . Transportation needs    Medical: Not on file    Non-medical: Not on file   Tobacco Use  . Smoking status: Former Games developer  . Smokeless tobacco: Never Used  Substance and Sexual Activity  . Alcohol use: Yes  . Drug use: Yes  . Sexual activity: Not on file  Lifestyle  . Physical activity    Days per week: Not on file    Minutes per session: Not on file  . Stress: Not on file  Relationships  . Social Musician on phone: Not on file    Gets together: Not on file    Attends religious service: Not on file    Active member of club or organization: Not on file    Attends meetings of clubs or organizations: Not on file    Relationship status: Not on file  Other Topics Concern  . Not on file  Social History Narrative  . Not on file   Additional Social History:                         Sleep: Good  Appetite:  Good  Current Medications: Current Facility-Administered Medications  Medication Dose Route Frequency Provider Last Rate Last Dose  . acetaminophen (TYLENOL) tablet 650 mg  650 mg Oral Q6H PRN Clapacs, John T, MD      . alum & mag hydroxide-simeth (MAALOX/MYLANTA) 200-200-20 MG/5ML suspension 30 mL  30 mL Oral Q4H PRN Clapacs, John T, MD      . amLODipine (NORVASC) tablet 10 mg  10  mg Oral Daily Clapacs, Jackquline DenmarkJohn T, MD   10 mg at 10/31/18 0756  . busPIRone (BUSPAR) tablet 10 mg  10 mg Oral TID Clapacs, Jackquline DenmarkJohn T, MD   10 mg at 10/31/18 0756  . carvedilol (COREG) tablet 25 mg  25 mg Oral BID WC Clapacs, Jackquline DenmarkJohn T, MD   25 mg at 10/31/18 0755  . hydrOXYzine (ATARAX/VISTARIL) tablet 50 mg  50 mg Oral TID PRN Clapacs, Jackquline DenmarkJohn T, MD   50 mg at 10/30/18 2104  . lisinopril (ZESTRIL) tablet 30 mg  30 mg Oral Daily Clapacs, Jackquline DenmarkJohn T, MD   30 mg at 10/31/18 0756  . magnesium hydroxide (MILK OF MAGNESIA) suspension 30 mL  30 mL Oral Daily PRN Clapacs, John T, MD      . OLANZapine (ZYPREXA) tablet 15 mg  15 mg Oral QHS Clapacs, Jackquline DenmarkJohn T, MD   15 mg at 10/30/18 2104  . traZODone (DESYREL) tablet 100 mg  100 mg Oral QHS PRN Clapacs, Jackquline DenmarkJohn T, MD   100 mg at  10/30/18 2105  . venlafaxine XR (EFFEXOR-XR) 24 hr capsule 300 mg  300 mg Oral Q breakfast Clapacs, Jackquline DenmarkJohn T, MD   300 mg at 10/31/18 0755    Lab Results: No results found for this or any previous visit (from the past 48 hour(s)).  Blood Alcohol level:  Lab Results  Component Value Date   ETH <10 10/21/2018    Metabolic Disorder Labs: No results found for: HGBA1C, MPG No results found for: PROLACTIN Lab Results  Component Value Date   TRIG 88 10/26/2018    Physical Findings: AIMS: Facial and Oral Movements Muscles of Facial Expression: None, normal Lips and Perioral Area: None, normal Jaw: None, normal Tongue: None, normal,Extremity Movements Upper (arms, wrists, hands, fingers): None, normal Lower (legs, knees, ankles, toes): None, normal, Trunk Movements Neck, shoulders, hips: None, normal, Overall Severity Severity of abnormal movements (highest score from questions above): None, normal Incapacitation due to abnormal movements: None, normal Patient's awareness of abnormal movements (rate only patient's report): No Awareness, Dental Status Current problems with teeth and/or dentures?: No Does patient usually wear dentures?: No  CIWA:  CIWA-Ar Total: 2 COWS:  COWS Total Score: 1  Musculoskeletal: Strength & Muscle Tone: within normal limits Gait & Station: normal Patient leans: N/A  Psychiatric Specialty Exam: Physical Exam  ROS  Blood pressure (!) 143/99, pulse 74, temperature 98.1 F (36.7 C), temperature source Oral, resp. rate 18, height 6' (1.829 m), weight 79.8 kg, SpO2 100 %.Body mass index is 23.87 kg/m.  General Appearance: Casual  Eye Contact:  Fair  Speech:  Slow  Volume:  Decreased  Mood:  Dysphoric  Affect:  Restricted  Thought Process:  Linear and Descriptions of Associations: Intact  Orientation:  Full (Time, Place, and Person)  Thought Content:  Rumination  Suicidal Thoughts:  Yes.  without intent/plan  Homicidal Thoughts:  No  Memory:   Immediate;   Fair Recent;   Fair Remote;   Fair  Judgement:  Fair  Insight:  Fair  Psychomotor Activity:  Normal  Concentration:  Concentration: Fair and Attention Span: Fair  Recall:  FiservFair  Fund of Knowledge:  Fair  Language:  Fair  Akathisia:  Negative  Handed:  Right  AIMS (if indicated):     Assets:  Resilience Social Support lacking by his report  ADL's:  Intact  Cognition:  WNL  Sleep:  Number of Hours: 6.25     Treatment Plan Summary: Daily contact with patient to  assess and evaluate symptoms and progress in treatment, Medication management and Plan Continue combination antidepressant therapy with augmenters continue groups at current precautions no change in precautions.  Monitor for safety  Johnn Hai, MD 10/31/2018, 10:21 AM

## 2018-10-31 NOTE — BHH Group Notes (Signed)
LCSW Aftercare Discharge Planning Group Note   10/31/2018 1315  Type of Group and Topic: Psychoeducational Group:  Discharge Planning  Participation Level:  Active  Description of Group  Discharge planning group reviews patient's anticipated discharge plans and assists patients to anticipate and address any barriers to wellness/recovery in the community.  Suicide prevention education is reviewed with patients in group.  Therapeutic Goals 1. Patients will state their anticipated discharge plan and mental health aftercare 2. Patients will identify potential barriers to wellness in the community setting 3. Patients will engage in problem solving, solution focused discussion of ways to anticipate and address barriers to wellness/recovery  Summary of Patient Progress: pt active in the discussion about wellness and made a number of comments throughout group.  Pt shared that in addition to mental health, he needs to most focus on the social and spiritual areas of wellness.  Pt also shared that he has significant anxiety when he is in social situations.  CSW engaged pt in discussing that anxiety is a very treatable mental health issue and encouraged him to mention the desire to address social anxiety to his follow provider.  PT reports he will be following up at the [place].   Plan for Discharge/Comments:    Transportation Means:   Supports:  Therapeutic Modalities: Motivational Interviewing    Joanne Chars, LCSW 10/31/2018 2:03 PM

## 2018-10-31 NOTE — BHH Group Notes (Signed)
[facility] Group Notes:  (Nursing/MHT/Case Management/Adjunct)  Date:  10/31/2018  Time:  9:35 PM  Type of Therapy:  Group Therapy  Participation Level:  Active  Participation Quality:  Appropriate  Affect:  Appropriate  Cognitive:  Alert  Insight:  Good  Engagement in Group:  Engaged  Modes of Intervention:  Support  Summary of Progress/Problems:  David Dennis 10/31/2018, 9:35 PM

## 2018-10-31 NOTE — Progress Notes (Signed)
D: Patient stated slept good last night .Stated appetite  good and energy level   normal. Stated concentration good . Stated on Depression scale 4, hopeless 4 and anxiety 4 .( low 0-10 high) Denies suicidal  homicidal ideations.  No auditory hallucinations  No pain concerns . Appropriate ADL'S. Interacting with peers and staff.Patient aware of [place] and unit programing , able to verbalize  indication . Denies suicidal ideations. Patient  able to manage   frustration, maintaining  control .  Aware of medication  given . Voice of no safety concerns .  Working on coping and decision making .Appetite good at meals . Interacting with peers  appropriately . Compliant  with medications.   Encourage group participation , verbalizing feelings   A: Encourage patient participation with unit programming . Instruction  Given on  Medication , verbalize understanding.  R: Voice no other concerns. Staff continue to monitor

## 2018-10-31 NOTE — Plan of Care (Signed)
Patient aware of [place] and unit programing , able to verbalize  indication . Denies suicidal ideations. Patient  able to manage   frustration, maintaining  control .  Aware of medication  given . Voice of no safety concerns .  Working on coping and decision making .Appetite good at meals . Interacting with peers  appropriately . Compliant  with medications.   Encourage group participation , verbalizing feelings  Problem: Education: Goal: Ability to make informed decisions regarding treatment will improve 10/31/2018 0930 by Leodis Liverpool, RN Outcome: Progressing 10/31/2018 0929 by Leodis Liverpool, RN Outcome: Progressing   Problem: Coping: Goal: Coping ability will improve 10/31/2018 0930 by Leodis Liverpool, RN Outcome: Progressing 10/31/2018 0929 by Leodis Liverpool, RN Outcome: Progressing   Problem: Health Behavior/Discharge Planning: Goal: Identification of resources available to assist in meeting health care needs will improve 10/31/2018 0930 by Leodis Liverpool, RN Outcome: Progressing 10/31/2018 0929 by Leodis Liverpool, RN Outcome: Progressing   Problem: Medication: Goal: Compliance with prescribed medication regimen will improve 10/31/2018 0930 by Leodis Liverpool, RN Outcome: Progressing 10/31/2018 0929 by Leodis Liverpool, RN Outcome: Progressing   Problem: Self-Concept: Goal: Ability to disclose and discuss suicidal ideas will improve 10/31/2018 0930 by Leodis Liverpool, RN Outcome: Progressing 10/31/2018 0929 by Leodis Liverpool, RN Outcome: Progressing Goal: Will verbalize positive feelings about self 10/31/2018 0930 by Leodis Liverpool, RN Outcome: Progressing 10/31/2018 0929 by Leodis Liverpool, RN Outcome: Progressing

## 2018-11-01 MED ORDER — PROPRANOLOL HCL 20 MG PO TABS
80.0000 mg | ORAL_TABLET | Freq: Two times a day (BID) | ORAL | Status: DC
  Administered 2018-11-01 – 2018-11-02 (×3): 80 mg via ORAL
  Filled 2018-11-01 (×3): qty 4

## 2018-11-01 NOTE — Plan of Care (Signed)
Pt rates depression and anxiety both 3/10 and denies SI, HI and AVH. Pt was educated on care plan and verbalizes understanding Water engineer Problem: Consults Goal: Concurrent Medical Patient Education Description: (See Patient Education Module for education specifics) Outcome: Progressing   Problem: B[facility] Concurrent Medical Problem Goal: LTG-Pt will be physically stable and he/significant other Description: (Patient will be physically stable and he/significant other will be able to verbalize understanding of follow-up care and symptoms that would warrant further treatment) Outcome: Progressing Goal: STG-Vital signs will be within defined limits or stabilized Description: (STG- Vital signs will be within defined limits or stabilized for individual) Outcome: Progressing Goal: STG-Compliance with medication and/or treatment as ordered Description: (STG-Compliance with medication and/or treatment as ordered by MD) Outcome: Progressing Goal: STG-Verbalize two symptoms that would warrant further Description: (STG-Verbalize two symptoms that would warrant further treatment) Outcome: Progressing Goal: STG-Patient will participate in management/stabilization Description: (STG-Patient will participate in management/stabilization of medical condition) Outcome: Progressing Goal: STG-Other (Specify): Description: STG-Other Concurrent Medical (Specify): Outcome: Progressing   Problem: Education: Goal: Ability to make informed decisions regarding treatment will improve Outcome: Progressing   Problem: Coping: Goal: Coping ability will improve Outcome: Progressing   Problem: Health Behavior/Discharge Planning: Goal: Identification of resources available to assist in meeting health care needs will improve Outcome: Progressing   Problem: Medication: Goal: Compliance with prescribed medication regimen will improve Outcome: Progressing   Problem: Self-Concept: Goal: Ability to disclose and  discuss suicidal ideas will improve Outcome: Progressing Goal: Will verbalize positive feelings about self Outcome: Progressing

## 2018-11-01 NOTE — Progress Notes (Signed)
[facility] MD Progress Note  11/01/2018 10:22 AM David Dennis  MRN:  [id number] Subjective:   Patient continues to speak in general terms about his inability to cope states "I am on an emotional roller coaster" however he is generally calm and conversant.  He denies wanting to harm himself now and when I note his progress he contradicts me and states he is not made any progress.  Seems certainly passive-aggressive and invested in pathology but denies wanting to harm self today can contract for safety here.  Not particularly anxious.  Affect constricted.  Denies hallucinations  Blood pressure still up we will make adjustments as below  Principal Problem: Suicidal behavior with attempted self-injury (HCC) Diagnosis: Principal Problem:   Suicidal behavior with attempted self-injury [facility]) Active Problems:   Suicide attempt (HCC)   Severe recurrent major depression without psychotic features (HCC)   Social anxiety disorder   Alcohol abuse  Total Time spent with patient: 20 minutes  Past Psychiatric History: No new data shared  Past Medical History:  Past Medical History:  Diagnosis Date  . Acute kidney failure (HCC)   . Cardiac arrest (HCC)   . Depression   . Paranoid schizophrenia (HCC)    History reviewed. No pertinent surgical history. Family History: History reviewed. No pertinent family history. Family Psychiatric  History: No new data shared Social History:  Social History   Substance and Sexual Activity  Alcohol Use Yes     Social History   Substance and Sexual Activity  Drug Use Yes    Social History   Socioeconomic History  . Marital status: Single    Spouse name: Not on file  . Number of children: Not on file  . Years of education: Not on file  . Highest education level: Not on file  Occupational History  . Not on file  Social Needs  . Financial resource strain: Not on file  . Food insecurity    Worry: Not on file    Inability: Not on file  .  Transportation needs    Medical: Not on file    Non-medical: Not on file  Tobacco Use  . Smoking status: Former Games developer  . Smokeless tobacco: Never Used  Substance and Sexual Activity  . Alcohol use: Yes  . Drug use: Yes  . Sexual activity: Not on file  Lifestyle  . Physical activity    Days per week: Not on file    Minutes per session: Not on file  . Stress: Not on file  Relationships  . Social Musician on phone: Not on file    Gets together: Not on file    Attends religious service: Not on file    Active member of club or organization: Not on file    Attends meetings of clubs or organizations: Not on file    Relationship status: Not on file  Other Topics Concern  . Not on file  Social History Narrative  . Not on file   Additional Social History:                         Sleep: Good  Appetite:  Good  Current Medications: Current Facility-Administered Medications  Medication Dose Route Frequency Provider Last Rate Last Dose  . acetaminophen (TYLENOL) tablet 650 mg  650 mg Oral Q6H PRN Clapacs, John T, MD      . alum & mag hydroxide-simeth (MAALOX/MYLANTA) 200-200-20 MG/5ML suspension 30 mL  30  mL Oral Q4H PRN Clapacs, John T, MD      . amLODipine (NORVASC) tablet 10 mg  10 mg Oral Daily Clapacs, Madie Reno, MD   10 mg at 11/01/18 [phone]  . busPIRone (BUSPAR) tablet 10 mg  10 mg Oral TID Clapacs, Madie Reno, MD   10 mg at 11/01/18 0805  . carvedilol (COREG) tablet 25 mg  25 mg Oral BID WC Clapacs, John T, MD   25 mg at 11/01/18 0806  . hydrOXYzine (ATARAX/VISTARIL) tablet 50 mg  50 mg Oral TID PRN Clapacs, Madie Reno, MD   50 mg at 10/31/18 2108  . lisinopril (ZESTRIL) tablet 30 mg  30 mg Oral Daily Clapacs, Madie Reno, MD   30 mg at 11/01/18 [phone]  . magnesium hydroxide (MILK OF MAGNESIA) suspension 30 mL  30 mL Oral Daily PRN Clapacs, John T, MD      . OLANZapine (ZYPREXA) tablet 15 mg  15 mg Oral QHS Clapacs, Madie Reno, MD   15 mg at 10/31/18 2107  . traZODone (DESYREL)  tablet 100 mg  100 mg Oral QHS PRN Clapacs, Madie Reno, MD   100 mg at 10/31/18 2108  . venlafaxine XR (EFFEXOR-XR) 24 hr capsule 300 mg  300 mg Oral Q breakfast Clapacs, John T, MD   300 mg at 11/01/18 0805    Lab Results: No results found for this or any previous visit (from the past 48 hour(s)).  Blood Alcohol level:  Lab Results  Component Value Date   ETH <10 15/17/6160    Metabolic Disorder Labs: No results found for: HGBA1C, MPG No results found for: PROLACTIN Lab Results  Component Value Date   TRIG 88 10/26/2018    Physical Findings: AIMS: Facial and Oral Movements Muscles of Facial Expression: None, normal Lips and Perioral Area: None, normal Jaw: None, normal Tongue: None, normal,Extremity Movements Upper (arms, wrists, hands, fingers): None, normal Lower (legs, knees, ankles, toes): None, normal, Trunk Movements Neck, shoulders, hips: None, normal, Overall Severity Severity of abnormal movements (highest score from questions above): None, normal Incapacitation due to abnormal movements: None, normal Patient's awareness of abnormal movements (rate only patient's report): No Awareness, Dental Status Current problems with teeth and/or dentures?: No Does patient usually wear dentures?: No  CIWA:  CIWA-Ar Total: 2 COWS:  COWS Total Score: 1  Musculoskeletal: Strength & Muscle Tone: within normal limits Gait & Station: normal Patient leans: N/A  Psychiatric Specialty Exam: Physical Exam  ROS  Blood pressure (!) 172/103, pulse 64, temperature 98.1 F (36.7 C), temperature source Oral, resp. rate 18, height 6' (1.829 m), weight 79.8 kg, SpO2 100 %.Body mass index is 23.87 kg/m.  General Appearance: Casual  Eye Contact:  Good  Speech:  Slow  Volume:  Decreased  Mood:  Dysphoric  Affect:  Blunt  Thought Process:  Coherent, Linear and Descriptions of Associations: Circumstantial  Orientation:  Full (Time, Place, and Person)  Thought Content:  Rumination  Suicidal  Thoughts:  No  Homicidal Thoughts:  No  Memory:  Immediate;   Fair Recent;   Fair Remote;   Fair  Judgement:  Fair  Insight:  Fair  Psychomotor Activity:  Normal  Concentration:  Attention Span: Good  Recall:  [employer] of Knowledge:  Fair  Language:  Fair  Akathisia:  Negative  Handed:  Right  AIMS (if indicated):     Assets:  Communication Skills Desire for Improvement Leisure Time Physical Health  ADL's:  Intact  Cognition:  WNL  Sleep:  Number of Hours: 8.15     Treatment Plan Summary: Daily contact with patient to assess and evaluate symptoms and progress in treatment, Medication management and Plan Continue current antidepressant therapy continue cognitive therapy patient again does seem very invested in his pathology and resistance improvement but we will adjust blood pressure meds and address his past progression is in therapy as well  Malvin JohnsFARAH,Eswin Worrell, MD 11/01/2018, 10:22 AM

## 2018-11-01 NOTE — Progress Notes (Signed)
Pt has been calm and cooperative today. He is becoming more social. Collier Bullock RN

## 2018-11-01 NOTE — B[facility] Group Notes (Signed)
LCSW Group Therapy Note 11/01/2018 1:15pm  Type of Therapy and Topic: Group Therapy: Feelings Around Returning Home & Establishing a Supportive Framework and Supporting Oneself When Supports Not Available  Participation Level: Active  Description of Group:  Patients first processed thoughts and feelings about upcoming discharge. These included fears of upcoming changes, lack of change, new living environments, judgements and expectations from others and overall stigma of mental health issues. The group then discussed the definition of a supportive framework, what that looks and feels like, and how do to discern it from an unhealthy non-supportive network. The group identified different types of supports as well as what to do when your family/friends are less than helpful or unavailable  Therapeutic Goals  1. Patient will identify one healthy supportive network that they can use at discharge. 2. Patient will identify one factor of a supportive framework and how to tell it from an unhealthy network. 3. Patient able to identify one coping skill to use when they do not have positive supports from others. 4. Patient will demonstrate ability to communicate their needs through discussion and/or role plays.  Summary of Patient Progress:  The patient reported he feels "okay" and is grateful for "things can be worse." Pt engaged during group session. As patients processed their anxiety about discharge and described healthy supports patient shared he does not know if he is ready to be discharge. He listed his family as his main support.  Patients identified at least one self-care tool they were willing to use after discharge; change negative thinking.   Therapeutic Modalities Cognitive Behavioral Therapy Motivational Interviewing   David Dennis  CUEBAS-COLON, LCSW 11/01/2018 10:36 AM

## 2018-11-02 MED ORDER — METOPROLOL TARTRATE 50 MG PO TABS: 50.0000 mg | ORAL_TABLET | Freq: Two times a day (BID) | ORAL | 0 refills | Status: DC

## 2018-11-02 MED ORDER — METOPROLOL TARTRATE 25 MG PO TABS
50.0000 mg | ORAL_TABLET | Freq: Two times a day (BID) | ORAL | Status: DC
  Administered 2018-11-02 – 2018-11-03 (×2): 50 mg via ORAL
  Filled 2018-11-02 (×2): qty 2

## 2018-11-02 MED ORDER — LISINOPRIL 30 MG PO TABS: 30.0000 mg | ORAL_TABLET | Freq: Every day | ORAL | 0 refills | Status: DC

## 2018-11-02 MED ORDER — OLANZAPINE 15 MG PO TABS: 15.0000 mg | ORAL_TABLET | Freq: Every day | ORAL | 0 refills | Status: DC

## 2018-11-02 MED ORDER — AMLODIPINE BESYLATE 10 MG PO TABS: 10.0000 mg | ORAL_TABLET | Freq: Every day | ORAL | 0 refills | Status: DC

## 2018-11-02 MED ORDER — VENLAFAXINE HCL ER 150 MG PO CP24: 300.0000 mg | ORAL_CAPSULE | Freq: Every day | ORAL | 0 refills | Status: DC

## 2018-11-02 MED ORDER — BUSPIRONE HCL 10 MG PO TABS: 10.0000 mg | ORAL_TABLET | Freq: Three times a day (TID) | ORAL | 0 refills | Status: DC

## 2018-11-02 MED ORDER — TRAZODONE HCL 100 MG PO TABS: 100.0000 mg | ORAL_TABLET | Freq: Every evening | ORAL | 0 refills | Status: DC | PRN

## 2018-11-02 MED ORDER — HYDROCHLOROTHIAZIDE 12.5 MG PO CAPS: 12.5000 mg | ORAL_CAPSULE | Freq: Every day | ORAL | Status: DC

## 2018-11-02 NOTE — B[facility] Group Notes (Signed)
LCSW Group Therapy Note   11/02/2018 12:54 PM   Type of Therapy and Topic:  Group Therapy:  Overcoming Obstacles   Participation Level:  None   Description of Group:    In this group patients will be encouraged to explore what they see as obstacles to their own wellness and recovery. They will be guided to discuss their thoughts, feelings, and behaviors related to these obstacles. The group will process together ways to cope with barriers, with attention given to specific choices patients can make. Each patient will be challenged to identify changes they are motivated to make in order to overcome their obstacles. This group will be process-oriented, with patients participating in exploration of their own experiences as well as giving and receiving support and challenge from other group members.   Therapeutic Goals: 1. Patient will identify personal and current obstacles as they relate to admission. 2. Patient will identify barriers that currently interfere with their wellness or overcoming obstacles.  3. Patient will identify feelings, thought process and behaviors related to these barriers. 4. Patient will identify two changes they are willing to make to overcome these obstacles:      Summary of Patient Progress Pt was present in group but did not engage in the group discussion at all.     Therapeutic Modalities:   Cognitive Behavioral Therapy Solution Focused Therapy Motivational Interviewing Relapse Prevention Therapy  Evalina Field, MSW, LCSW Clinical Social Work 11/02/2018 12:54 PM

## 2018-11-02 NOTE — Plan of Care (Signed)
  Problem: Coping: Goal: Coping ability will improve Outcome: Progressing  D: Patient has been pleasant and cooperative. Denies SI, HI and AVH. Isolative to room and self. Mood is sad. Affect is flat. Contracts for safety. A: Continue to monitor for safety R: Safety maintained.

## 2018-11-02 NOTE — Plan of Care (Signed)
D- Patient alert and oriented. Patient presents in a pleasant mood on assessment stating that he slept good last night and had no major complaints to voice to this Probation officer. Patient reported depression/anxiety, rating them both a "3/10" on his self-inventory, stating that he's anxious "being around people". Patient denies SI, HI, AVH, and pain at this time. Patient's goal for today is to "relax, think".  A- Scheduled medications administered to patient, per MD orders. Support and encouragement provided.  Routine safety checks conducted every 15 minutes.  Patient informed to notify staff with problems or concerns.  R- No adverse drug reactions noted. Patient contracts for safety at this time. Patient compliant with medications and treatment plan. Patient receptive, calm, and cooperative. Patient interacts well with others on the unit.  Patient remains safe at this time.  Problem: Consults Goal: Concurrent Medical Patient Education Description: (See Patient Education Module for education specifics) Outcome: Progressing   Problem: [facility] Concurrent Medical Problem Goal: LTG-Pt will be physically stable and he/significant other Description: (Patient will be physically stable and he/significant other will be able to verbalize understanding of follow-up care and symptoms that would warrant further treatment) Outcome: Progressing Goal: STG-Vital signs will be within defined limits or stabilized Description: (STG- Vital signs will be within defined limits or stabilized for individual) Outcome: Progressing Goal: STG-Compliance with medication and/or treatment as ordered Description: (STG-Compliance with medication and/or treatment as ordered by MD) Outcome: Progressing Goal: STG-Verbalize two symptoms that would warrant further Description: (STG-Verbalize two symptoms that would warrant further treatment) Outcome: Progressing Goal: STG-Patient will participate in management/stabilization Description:  (STG-Patient will participate in management/stabilization of medical condition) Outcome: Progressing Goal: STG-Other (Specify): Description: STG-Other Concurrent Medical (Specify): Outcome: Progressing   Problem: Education: Goal: Ability to make informed decisions regarding treatment will improve Outcome: Progressing   Problem: Coping: Goal: Coping ability will improve Outcome: Progressing   Problem: Health Behavior/Discharge Planning: Goal: Identification of resources available to assist in meeting health care needs will improve Outcome: Progressing   Problem: Medication: Goal: Compliance with prescribed medication regimen will improve Outcome: Progressing   Problem: Self-Concept: Goal: Ability to disclose and discuss suicidal ideas will improve Outcome: Progressing Goal: Will verbalize positive feelings about self Outcome: Progressing

## 2018-11-02 NOTE — Progress Notes (Signed)
Recreation Therapy Notes   Date: 11/02/2018  Time: 9:30 am   Location: Craft room  Behavioral response: Appropriate   Intervention Topic: Stress  Discussion/Intervention:  Group content on today was focused on stress. The group defined stress and way to cope with stress. Participants expressed how they know when they are stresses out. Individuals described the different ways they have to cope with stress. The group stated reasons why it is important to cope with stress. Patient explained what good stress is and some examples. The group participated in the intervention "Stress Management". Individuals were separated into two group and answered questions related to stress.   Clinical Observations/Feedback:  Patient came to group and defined stress as unconfutable situations. He was not engaged in the intervention during group.   David Dennis LRT/CTRS         Joh Rao 11/02/2018 11:45 AM

## 2018-11-02 NOTE — Progress Notes (Signed)
D: Patient has been pleasant and cooperative. Denies SI, HI and AVH. Isolative to room and self. Mood is sad. Affect is flat. Contracts for safety. A: Continue to monitor for safety R: Safety maintained.

## 2018-11-02 NOTE — Progress Notes (Signed)
D - Patient was in his room upon arrival to the unit. Patient was pleasant during assessment. Patient denies SI/HI/AVH, pain, anxiety and depression with this Probation officer. Patientactive on the milieu this evening and observed interacting appropriately with staff and peers. Patient didn't have any complaints.Patient more active on the unit this evening.   A - Patient compliant with medication administration per MD orders and procedures on the unit. Patient isolative to his room this evening. Patient given education. Patient given support and encouragement to be active in his treatment plan. Patient informed to let staff know if there are any issues or problems on the unit.   R - Patient being monitored Q 15 minutes for safety per unit protocol. Patient remains safe on the unit.

## 2018-11-02 NOTE — Progress Notes (Signed)
[facility] MD Progress Note  11/02/2018 4:03 PM David Dennis  MRN:  [id number] Subjective: Follow-up for this gentleman with depression and anxiety.  He tells me, as he told the doctor over the weekend, that he does not feel like he is any better.  He still feels sad down and hopeless.  Patient has a very hangdog negative affect about him.  He admits that he is not acutely planning to kill himself but that he still has hopeless suicidal thoughts.  He has been tolerating medicine okay.  Has made some effort to engage in groups.  Seems to still be hesitant to believe that treatment could be good for him.  No evidence of psychosis.  No new physical complaints. Principal Problem: Suicidal behavior with attempted self-injury [facility]) Diagnosis: Principal Problem:   Suicidal behavior with attempted self-injury [facility]) Active Problems:   Suicide attempt (HCC)   Severe recurrent major depression without psychotic features (HCC)   Social anxiety disorder   Alcohol abuse  Total Time spent with patient: 30 minutes  Past Psychiatric History: Past history of anxiety and depression recurrent for years.  Past Medical History:  Past Medical History:  Diagnosis Date  . Acute kidney failure (HCC)   . Cardiac arrest (HCC)   . Depression   . Paranoid schizophrenia (HCC)    History reviewed. No pertinent surgical history. Family History: History reviewed. No pertinent family history. Family Psychiatric  History: See previous Social History:  Social History   Substance and Sexual Activity  Alcohol Use Yes     Social History   Substance and Sexual Activity  Drug Use Yes    Social History   Socioeconomic History  . Marital status: Single    Spouse name: Not on file  . Number of children: Not on file  . Years of education: Not on file  . Highest education level: Not on file  Occupational History  . Not on file  Social Needs  . Financial resource strain: Not on file  . Food insecurity    Worry:  Not on file    Inability: Not on file  . Transportation needs    Medical: Not on file    Non-medical: Not on file  Tobacco Use  . Smoking status: Former Games developer  . Smokeless tobacco: Never Used  Substance and Sexual Activity  . Alcohol use: Yes  . Drug use: Yes  . Sexual activity: Not on file  Lifestyle  . Physical activity    Days per week: Not on file    Minutes per session: Not on file  . Stress: Not on file  Relationships  . Social Musician on phone: Not on file    Gets together: Not on file    Attends religious service: Not on file    Active member of club or organization: Not on file    Attends meetings of clubs or organizations: Not on file    Relationship status: Not on file  Other Topics Concern  . Not on file  Social History Narrative  . Not on file   Additional Social History:                         Sleep: Fair  Appetite:  Fair  Current Medications: Current Facility-Administered Medications  Medication Dose Route Frequency Provider Last Rate Last Dose  . acetaminophen (TYLENOL) tablet 650 mg  650 mg Oral Q6H PRN Clapacs, Jackquline Denmark, MD      .  alum & mag hydroxide-simeth (MAALOX/MYLANTA) 200-200-20 MG/5ML suspension 30 mL  30 mL Oral Q4H PRN Clapacs, John T, MD      . amLODipine (NORVASC) tablet 10 mg  10 mg Oral Daily Clapacs, Jackquline DenmarkJohn T, MD   10 mg at 11/02/18 0806  . busPIRone (BUSPAR) tablet 10 mg  10 mg Oral TID Clapacs, John T, MD   10 mg at 11/02/18 1319  . hydrOXYzine (ATARAX/VISTARIL) tablet 50 mg  50 mg Oral TID PRN Clapacs, Jackquline DenmarkJohn T, MD   50 mg at 10/31/18 2108  . lisinopril (ZESTRIL) tablet 30 mg  30 mg Oral Daily Clapacs, Jackquline DenmarkJohn T, MD   30 mg at 11/02/18 0805  . magnesium hydroxide (MILK OF MAGNESIA) suspension 30 mL  30 mL Oral Daily PRN Clapacs, John T, MD      . metoprolol tartrate (LOPRESSOR) tablet 50 mg  50 mg Oral BID Clapacs, John T, MD      . OLANZapine (ZYPREXA) tablet 15 mg  15 mg Oral QHS Clapacs, Jackquline DenmarkJohn T, MD   15 mg at  11/01/18 2057  . traZODone (DESYREL) tablet 100 mg  100 mg Oral QHS PRN Clapacs, Jackquline DenmarkJohn T, MD   100 mg at 10/31/18 2108  . venlafaxine XR (EFFEXOR-XR) 24 hr capsule 300 mg  300 mg Oral Q breakfast Clapacs, John T, MD   300 mg at 11/02/18 0805    Lab Results: No results found for this or any previous visit (from the past 48 hour(s)).  Blood Alcohol level:  Lab Results  Component Value Date   ETH <10 10/21/2018    Metabolic Disorder Labs: No results found for: HGBA1C, MPG No results found for: PROLACTIN Lab Results  Component Value Date   TRIG 88 10/26/2018    Physical Findings: AIMS: Facial and Oral Movements Muscles of Facial Expression: None, normal Lips and Perioral Area: None, normal Jaw: None, normal Tongue: None, normal,Extremity Movements Upper (arms, wrists, hands, fingers): None, normal Lower (legs, knees, ankles, toes): None, normal, Trunk Movements Neck, shoulders, hips: None, normal, Overall Severity Severity of abnormal movements (highest score from questions above): None, normal Incapacitation due to abnormal movements: None, normal Patient's awareness of abnormal movements (rate only patient's report): No Awareness, Dental Status Current problems with teeth and/or dentures?: No Does patient usually wear dentures?: No  CIWA:  CIWA-Ar Total: 2 COWS:  COWS Total Score: 1  Musculoskeletal: Strength & Muscle Tone: within normal limits Gait & Station: normal Patient leans: N/A  Psychiatric Specialty Exam: Physical Exam  Nursing note and vitals reviewed. Constitutional: He appears well-developed and well-nourished.  HENT:  Head: Normocephalic and atraumatic.  Eyes: Pupils are equal, round, and reactive to light. Conjunctivae are normal.  Neck: Normal range of motion.  Cardiovascular: Regular rhythm and normal heart sounds.  Respiratory: Effort normal. No respiratory distress.  GI: Soft.  Musculoskeletal: Normal range of motion.  Neurological: He is alert.   Skin: Skin is warm and dry.  Psychiatric: Judgment normal. His affect is blunt. His speech is delayed. He is slowed. Cognition and memory are normal. Cognition and memory are not impaired. He does not express impulsivity. He exhibits a depressed mood. He expresses suicidal ideation. He expresses no suicidal plans.    Review of Systems  Constitutional: Negative.   HENT: Negative.   Eyes: Negative.   Respiratory: Negative.   Cardiovascular: Negative.   Gastrointestinal: Negative.   Musculoskeletal: Negative.   Skin: Negative.   Neurological: Negative.   Psychiatric/Behavioral: Positive for depression and suicidal ideas.  Negative for hallucinations and substance abuse. The patient is nervous/anxious.     Blood pressure (!) 150/103, pulse 63, temperature 97.6 F (36.4 C), temperature source Oral, resp. rate 18, height 6' (1.829 m), weight 79.8 kg, SpO2 100 %.Body mass index is 23.87 kg/m.  General Appearance: Casual  Eye Contact:  Fair  Speech:  Slow  Volume:  Decreased  Mood:  Dysphoric  Affect:  Congruent  Thought Process:  Coherent  Orientation:  Full (Time, Place, and Person)  Thought Content:  Logical  Suicidal Thoughts:  Yes.  without intent/plan  Homicidal Thoughts:  No  Memory:  Immediate;   Fair Recent;   Poor Remote;   Fair  Judgement:  Fair  Insight:  Fair  Psychomotor Activity:  Restlessness  Concentration:  Concentration: Fair  Recall:  [employer] of Knowledge:  Fair  Language:  Fair  Akathisia:  No  Handed:  Right  AIMS (if indicated):     Assets:  Desire for Improvement Housing Physical Health Social Support  ADL's:  Intact  Cognition:  WNL  Sleep:  Number of Hours: 8     Treatment Plan Summary: Daily contact with patient to assess and evaluate symptoms and progress in treatment, Medication management and Plan Patient remains very depressed and down but not psychotic.  No acute suicidal ideation.  Probably getting about as much benefit as he will out  of short-term hospitalization we discussed a plan for discharge tomorrow.  I restated to him that his opportunity for treatment through the [place] system is large.  As far as his blood pressure it continues to run high despite being now on 3 different antihypertensive medicines.  I have changed the propranolol over to metoprolol in hopes that will work better for blood pressure.  Meanwhile encourage group attendance and encourage optimism.  We will prepare medications and prescriptions and likely plan on discharge tomorrow.  Alethia Berthold, MD 11/02/2018, 4:03 PM

## 2018-11-02 NOTE — Plan of Care (Signed)
Patient compliant with medication administration per MD orders  Problem: Medication: Goal: Compliance with prescribed medication regimen will improve Outcome: Progressing   

## 2018-11-03 ENCOUNTER — Encounter (HOSPITAL_COMMUNITY): Payer: Self-pay

## 2018-11-03 MED ORDER — LISINOPRIL 30 MG PO TABS: 30.0000 mg | ORAL_TABLET | Freq: Every day | ORAL | 1 refills | Status: DC

## 2018-11-03 MED ORDER — AMLODIPINE BESYLATE 10 MG PO TABS: 10.0000 mg | ORAL_TABLET | Freq: Every day | ORAL | 1 refills | Status: AC

## 2018-11-03 MED ORDER — BUSPIRONE HCL 10 MG PO TABS: 10.0000 mg | ORAL_TABLET | Freq: Three times a day (TID) | ORAL | 1 refills | Status: DC

## 2018-11-03 MED ORDER — OLANZAPINE 15 MG PO TABS: 15.0000 mg | ORAL_TABLET | Freq: Every day | ORAL | 1 refills | Status: DC

## 2018-11-03 MED ORDER — METOPROLOL TARTRATE 50 MG PO TABS: 50.0000 mg | ORAL_TABLET | Freq: Two times a day (BID) | ORAL | 1 refills | Status: AC

## 2018-11-03 MED ORDER — VENLAFAXINE HCL ER 150 MG PO CP24: 300.0000 mg | ORAL_CAPSULE | Freq: Every day | ORAL | 1 refills | Status: DC

## 2018-11-03 MED ORDER — TRAZODONE HCL 100 MG PO TABS: 100.0000 mg | ORAL_TABLET | Freq: Every evening | ORAL | 1 refills | Status: DC | PRN

## 2018-11-03 NOTE — Progress Notes (Signed)
Pt was educated on d/c plan and verbalized understanding.Pt denies SI, HI and AVH. Pt received prescription, info packet and belongings. Collier Bullock RN

## 2018-11-03 NOTE — Discharge Summary (Signed)
Physician Discharge Summary Note  Patient:  David Dennis is an 53 y.o., male MRN:  [id number] DOB:  [birth year] Patient phone:  [phone] (home)  Patient address:   [place] [address],  Total Time spent with patient: 45 minutes  Date of Admission:  10/27/2018 Date of Discharge: November 03, 2018  Reason for Admission: Patient was admitted because of what appeared to be a suicide attempt or at least an attempt at self injury by overdose that resulted in a seizure and admission to the medical unit.  Principal Problem: Suicidal behavior with attempted self-injury [facility]) Discharge Diagnoses: Principal Problem:   Suicidal behavior with attempted self-injury [facility]) Active Problems:   Suicide attempt ([place])   Severe recurrent major depression without psychotic features ([place])   Social anxiety disorder   Alcohol abuse   Past Psychiatric History: Longstanding history of anxiety disorder with severe social anxiety and depression.  Previous suicidality.  History of noncompliance with recommended treatment.  Past Medical History:  Past Medical History:  Diagnosis Date  . Acute kidney failure ([place])   . Cardiac arrest ([place])   . Depression   . Generalized anxiety disorder   . Hypertension   . Paranoid schizophrenia ([place])    History reviewed. No pertinent surgical history. Family History: History reviewed. No pertinent family history. Family Psychiatric  History: See previous Social History:  Social History   Substance and Sexual Activity  Alcohol Use Yes   Comment: "about a 12 pack of beer everyday"     Social History   Substance and Sexual Activity  Drug Use Yes    Social History   Socioeconomic History  . Marital status: Single    Spouse name: Not on file  . Number of children: Not on file  . Years of education: Not on file  . Highest education level: Not on file  Occupational History  . Not on file  Social Needs  . Financial  resource strain: Not on file  . Food insecurity    Worry: Not on file    Inability: Not on file  . Transportation needs    Medical: Not on file    Non-medical: Not on file  Tobacco Use  . Smoking status: Former Research scientist (life sciences)  . Smokeless tobacco: Never Used  Substance and Sexual Activity  . Alcohol use: Yes    Comment: "about a 12 pack of beer everyday"  . Drug use: Yes  . Sexual activity: Not on file  Lifestyle  . Physical activity    Days per week: Not on file    Minutes per session: Not on file  . Stress: Not on file  Relationships  . Social Herbalist on phone: Not on file    Gets together: Not on file    Attends religious service: Not on file    Active member of club or organization: Not on file    Attends meetings of clubs or organizations: Not on file    Relationship status: Not on file  Other Topics Concern  . Not on file  Social History Narrative   ** Merged History Encounter [facility] Course: Patient admitted to the psychiatric unit.  15-minute checks employed.  Patient was cooperative with treatment in the hospital.  Attended groups regularly.  Appeared to be forthcoming and interactive and was open to medication treatment.  Patient continued to endorse sad dysphoric mood social anxiety and lack of confidence in  himself however he showed no suicidality and at discharge was stating that he had no current suicidal ideation and was agreeable to pursuing follow-up at the [facility].  He tolerated medications well with increase in the dose of venlafaxine.  He did have elevated blood pressure during his time in the hospital which was treated with antihypertensive medicine.  Otherwise no major medical problems.  Patient was educated that the [facility] system should be able to provide extensive follow-up treatment for him and that his conditions are very treatable.  Patient discharged back home with follow-up as noted.  Physical Findings: AIMS: Facial and Oral Movements Muscles  of Facial Expression: None, normal Lips and Perioral Area: None, normal Jaw: None, normal Tongue: None, normal,Extremity Movements Upper (arms, wrists, hands, fingers): None, normal Lower (legs, knees, ankles, toes): None, normal, Trunk Movements Neck, shoulders, hips: None, normal, Overall Severity Severity of abnormal movements (highest score from questions above): None, normal Incapacitation due to abnormal movements: None, normal Patient's awareness of abnormal movements (rate only patient's report): No Awareness, Dental Status Current problems with teeth and/or dentures?: No Does patient usually wear dentures?: No  CIWA:  CIWA-Ar Total: 2 COWS:  COWS Total Score: 1  Musculoskeletal: Strength & Muscle Tone: within normal limits Gait & Station: normal Patient leans: N/A  Psychiatric Specialty Exam: Physical Exam  Nursing note and vitals reviewed. Constitutional: He appears well-developed and well-nourished.  HENT:  Head: Normocephalic and atraumatic.  Eyes: Pupils are equal, round, and reactive to light. Conjunctivae are normal.  Neck: Normal range of motion.  Cardiovascular: Regular rhythm and normal heart sounds.  Respiratory: Effort normal. No respiratory distress.  GI: Soft.  Musculoskeletal: Normal range of motion.  Neurological: He is alert.  Skin: Skin is warm and dry.  Psychiatric: He has a normal mood and affect. His behavior is normal. Judgment and thought content normal.    Review of Systems  Constitutional: Negative.   HENT: Negative.   Eyes: Negative.   Respiratory: Negative.   Cardiovascular: Negative.   Gastrointestinal: Negative.   Musculoskeletal: Negative.   Skin: Negative.   Neurological: Negative.   Psychiatric/Behavioral: Negative.     Blood pressure (!) 141/96, pulse 65, temperature 98 F (36.7 C), temperature source Oral, resp. rate 18, height 6' (1.829 m), weight 79.8 kg, SpO2 100 %.Body mass index is 23.87 kg/m.  General Appearance:  Casual  Eye Contact:  Good  Speech:  Clear and Coherent  Volume:  Normal  Mood:  Euthymic  Affect:  Congruent  Thought Process:  Coherent  Orientation:  Full (Time, Place, and Person)  Thought Content:  Logical  Suicidal Thoughts:  No  Homicidal Thoughts:  No  Memory:  Immediate;   Fair Recent;   Fair Remote;   Fair  Judgement:  Fair  Insight:  Fair  Psychomotor Activity:  Normal  Concentration:  Concentration: Fair  Recall:  [employer]  Fund of Knowledge:  Fair  Language:  Fair  Akathisia:  No  Handed:  Right  AIMS (if indicated):     Assets:  Desire for Improvement Housing Physical Health Resilience  ADL's:  Intact  Cognition:  WNL  Sleep:  Number of Hours: 7.5     Have you used any form of tobacco in the last 30 days? (Cigarettes, Smokeless Tobacco, Cigars, and/or Pipes): No  Has this patient used any form of tobacco in the last 30 days? (Cigarettes, Smokeless Tobacco, Cigars, and/or Pipes) Yes, No  Blood Alcohol level:  Lab Results  Component Value Date  ETH <10 10/21/2018   ETH <10 04/01/2018    Metabolic Disorder Labs:  Lab Results  Component Value Date   HGBA1C 5.5 04/17/2018   MPG 111.15 04/17/2018   No results found for: PROLACTIN Lab Results  Component Value Date   CHOL 182 04/17/2018   TRIG 88 10/26/2018   HDL 42 04/17/2018   CHOLHDL 4.3 04/17/2018   VLDL 10 04/17/2018   LDLCALC 130 (H) 04/17/2018    See Psychiatric Specialty Exam and Suicide Risk Assessment completed by Attending Physician prior to discharge.  Discharge destination:  Home  Is patient on multiple antipsychotic therapies at discharge:  No   Has Patient had three or more failed trials of antipsychotic monotherapy by history:  No  Recommended Plan for Multiple Antipsychotic Therapies: NA  Discharge Instructions    Diet - low sodium heart healthy   Complete by: As directed    Increase activity slowly   Complete by: As directed      Allergies as of 11/03/2018   No Known  Allergies     Medication List    STOP taking these medications   acetaminophen 500 MG tablet Commonly known as: TYLENOL   carvedilol 25 MG tablet Commonly known as: COREG   hydrOXYzine 25 MG tablet Commonly known as: ATARAX/VISTARIL   levETIRAcetam 500 MG tablet Commonly known as: KEPPRA   multivitamin with minerals tablet   OXcarbazepine 150 MG tablet Commonly known as: TRILEPTAL   oxcarbazepine 600 MG tablet Commonly known as: TRILEPTAL   oxyCODONE 5 MG immediate release tablet Commonly known as: Oxy IR/ROXICODONE   venlafaxine 75 MG tablet Commonly known as: EFFEXOR Replaced by: venlafaxine XR 150 MG 24 hr capsule     TAKE these medications     Indication  amLODipine 10 MG tablet Commonly known as: NORVASC Take 1 tablet (10 mg total) by mouth daily.  Indication: High Blood Pressure Disorder   busPIRone 10 MG tablet Commonly known as: BUSPAR Take 1 tablet (10 mg total) by mouth 3 (three) times daily. What changed: when to take this  Indication: Major Depressive Disorder   lisinopril 30 MG tablet Commonly known as: ZESTRIL Take 1 tablet (30 mg total) by mouth daily. What changed:   medication strength  how much to take  Indication: High Blood Pressure Disorder   metoprolol tartrate 50 MG tablet Commonly known as: LOPRESSOR Take 1 tablet (50 mg total) by mouth 2 (two) times daily.  Indication: High Blood Pressure Disorder   OLANZapine 15 MG tablet Commonly known as: ZYPREXA Take 1 tablet (15 mg total) by mouth at bedtime. What changed:   medication strength  how much to take  Indication: Major Depressive Disorder   traZODone 100 MG tablet Commonly known as: DESYREL Take 1 tablet (100 mg total) by mouth at bedtime as needed for sleep.  Indication: Trouble Sleeping   venlafaxine XR 150 MG 24 hr capsule Commonly known as: EFFEXOR-XR Take 2 capsules (300 mg total) by mouth daily with breakfast. Replaces: venlafaxine 75 MG tablet   Indication: Major Depressive Disorder      Follow-up Information    [facility] Follow up on 11/05/2018.   Why: You are scheduled for a follow up appointment by telephone with Dr. Silvio Pate on Thursday, September 24th at 11:00am. Contact information: [address] [place] [address] [phone]           Follow-up recommendations:  Activity:  Activity as tolerated Diet:  Regular diet Other:  Follow-up with outpatient providers  through the [place] [facility]  Comments: Prescriptions and samples given at discharge  Signed: Mordecai Rasmussen, MD 11/03/2018, 5:05 PM

## 2018-11-03 NOTE — Progress Notes (Signed)
Recreation Therapy Notes  INPATIENT RECREATION TR PLAN  Patient Details Name: David Dennis MRN: [id number] DOB: 12/10/1965 Today's Date: 11/03/2018  Rec Therapy Plan Is patient appropriate for Therapeutic Recreation?: Yes Treatment times per week: at least 3 Estimated Length of Stay: 5-7 days TR Treatment/Interventions: Group participation (Comment)  Discharge Criteria Pt will be discharged from therapy if:: Discharged Treatment plan/goals/alternatives discussed and agreed upon by:: Patient/family  Discharge Summary Short term goals set: Patient will identify 3 healthy leisure activities that can be utilized post d/c within 5 recreation therapy group sessions Short term goals met: Complete Progress toward goals comments: Groups attended Which groups?: Coping skills, Leisure education, Stress management, Goal setting, Other (Comment)(Problem solving) Reason goals not met: N/A Therapeutic equipment acquired: N/A Reason patient discharged from therapy: Discharge from hospital Pt/family agrees with progress & goals achieved: Yes Date patient discharged from therapy: 11/03/18      11/03/2018, 11:25 AM  

## 2018-11-03 NOTE — BHH Suicide Risk Assessment (Signed)
[facility] Discharge Suicide Risk Assessment   Principal Problem: Suicidal behavior with attempted self-injury [facility]) Discharge Diagnoses: Principal Problem:   Suicidal behavior with attempted self-injury [facility]) Active Problems:   Suicide attempt ([place])   Severe recurrent major depression without psychotic features ([place])   Social anxiety disorder   Alcohol abuse   Total Time spent with patient: 45 minutes  Musculoskeletal: Strength & Muscle Tone: within normal limits Gait & Station: normal Patient leans: N/A  Psychiatric Specialty Exam: Review of Systems  Constitutional: Negative.   HENT: Negative.   Eyes: Negative.   Respiratory: Negative.   Cardiovascular: Negative.   Gastrointestinal: Negative.   Musculoskeletal: Negative.   Skin: Negative.   Neurological: Negative.   Psychiatric/Behavioral: Negative.     Blood pressure (!) 141/96, pulse 65, temperature 98 F (36.7 C), temperature source Oral, resp. rate 18, height 6' (1.829 m), weight 79.8 kg, SpO2 100 %.Body mass index is 23.87 kg/m.  General Appearance: Casual  Eye Contact::  Good  Speech:  Normal Rate409  Volume:  Normal  Mood:  Euthymic  Affect:  Constricted  Thought Process:  Coherent  Orientation:  Full (Time, Place, and Person)  Thought Content:  Logical  Suicidal Thoughts:  No  Homicidal Thoughts:  No  Memory:  Immediate;   Fair Recent;   Fair Remote;   Fair  Judgement:  Fair  Insight:  Fair  Psychomotor Activity:  Normal  Concentration:  Fair  Recall:  [employer] [place]  Language: Fair  Akathisia:  No  Handed:  Right  AIMS (if indicated):     Assets:  Desire for Improvement Housing Physical Health Social Support  Sleep:  Number of Hours: 7.5  Cognition: WNL  ADL's:  Intact   Mental Status Per Nursing Assessment::   On Admission:  NA  Demographic Factors:  Male, Caucasian and Unemployed  Loss Factors: NA  Historical Factors: Prior suicide attempts and Impulsivity  Risk  Reduction Factors:   Sense of responsibility to family, Religious beliefs about death, Positive social support and Positive therapeutic relationship  Continued Clinical Symptoms:  Depression:   Impulsivity  Cognitive Features That Contribute To Risk:  None    Suicide Risk:  Minimal: No identifiable suicidal ideation.  Patients presenting with no risk factors but with morbid ruminations; may be classified as minimal risk based on the severity of the depressive symptoms  Follow-up [facility] up on 11/05/2018.   Why: You are scheduled for a follow up appointment by telephone with Dr. Thayer Jew on Thursday, September 24th at 11:00am. Contact information: [place] [address] [phone]           Plan Of Care/Follow-up recommendations:  Activity:  Activity as tolerated Diet:  Regular diet Other:  [facility]  Continue current medication for now.  Get involved with therapy and med management through the [place] system.  Alethia Berthold, MD 11/03/2018, 9:16 AM

## 2018-11-03 NOTE — Progress Notes (Signed)
Recreation Therapy Notes   Date: 11/03/2018  Time: 9:30 am   Location: Craft room  Behavioral response: Appropriate   Intervention Topic: Coping-Skills  Discussion/Intervention:  Group content on today was focused on coping skills. The group defined what coping skills are and when they can be used. Individuals described how they normally cope with thing and the coping skills they normally use. Patients expressed why it is important to cope with things and how not coping with things can affect you. The group participated in the intervention "Exploring coping skills" where they had a chance to test new coping skills they could use in the future.   Clinical Observations/Feedback:  Patient came to group and was focused on what peers and staff had to say about coping skills. Individual was social with peers and staff while participating in the intervention    David Dennis LRT/CTRS         [name] 11/03/2018 11:22 AM

## 2018-11-03 NOTE — Plan of Care (Signed)
  Problem: Consults Goal: Concurrent Medical Patient Education Description: (See Patient Education Module for education specifics) Outcome: Adequate for Discharge   Problem: [facility] Concurrent Medical Problem Goal: LTG-Pt will be physically stable and he/significant other Description: (Patient will be physically stable and he/significant other will be able to verbalize understanding of follow-up care and symptoms that would warrant further treatment) Outcome: Adequate for Discharge Goal: STG-Vital signs will be within defined limits or stabilized Description: (STG- Vital signs will be within defined limits or stabilized for individual) Outcome: Adequate for Discharge Goal: STG-Compliance with medication and/or treatment as ordered Description: (STG-Compliance with medication and/or treatment as ordered by MD) Outcome: Adequate for Discharge Goal: STG-Verbalize two symptoms that would warrant further Description: (STG-Verbalize two symptoms that would warrant further treatment) Outcome: Adequate for Discharge Goal: STG-Patient will participate in management/stabilization Description: (STG-Patient will participate in management/stabilization of medical condition) Outcome: Adequate for Discharge Goal: STG-Other (Specify): Description: STG-Other Concurrent Medical (Specify): Outcome: Adequate for Discharge   Problem: Education: Goal: Ability to make informed decisions regarding treatment will improve Outcome: Adequate for Discharge  Pt denies depression, anxiety, SI, HI and AVH. Pt was educated on care plan and verbalizes understanding. Collier Bullock RN.   Problem: Coping: Goal: Coping ability will improve Outcome: Adequate for Discharge   Problem: Health Behavior/Discharge Planning: Goal: Identification of resources available to assist in meeting health care needs will improve Outcome: Adequate for Discharge   Problem: Medication: Goal: Compliance with prescribed medication regimen will  improve Outcome: Adequate for Discharge   Problem: Self-Concept: Goal: Ability to disclose and discuss suicidal ideas will improve Outcome: Adequate for Discharge Goal: Will verbalize positive feelings about self Outcome: Adequate for Discharge

## 2018-11-03 NOTE — Plan of Care (Signed)
  Problem: Leisure Education Goal: STG - Patient will identify 3 healthy leisure activities that can be utilized post d/c within 5 recreation therapy group sessions Outcome: Completed/Met

## 2018-11-03 NOTE — Progress Notes (Signed)
  [facility] Adult Case Management Discharge Plan :  Will you be returning to the same living situation after discharge:  Yes,  pt is returning home. At discharge, do you have transportation home?: Yes,  CSW will provide with taxi voucher Do you have the ability to pay for your medications: No.  Release of information consent forms completed and in the chart;  Patient's signature needed at discharge.  Patient to Follow up at: Follow-up Information    [facility]/2018.   Why: You are scheduled for a follow up appointment by telephone with Dr. Thayer Jew on Thursday, September 24th at 11:00am. Contact information: [place] [address] [phone]           Next level of care provider has access to [place] and Suicide Prevention discussed: No. Patient declined SPE.  Have you used any form of tobacco in the last 30 days? (Cigarettes, Smokeless Tobacco, Cigars, and/or Pipes): No  Has patient been referred to the Quitline?: N/A patient is not a smoker  Patient has been referred for addiction treatment: [place], LCSW 11/03/2018, 9:13 AM

## 2018-11-03 NOTE — Tx Team (Signed)
Interdisciplinary Treatment and Diagnostic Plan Update  11/03/2018 Time of Session: 8:30AM David Dennis MRN: [id number]  Principal Diagnosis: Suicidal behavior with attempted self-injury [facility])  Secondary Diagnoses: Principal Problem:   Suicidal behavior with attempted self-injury [name] Active Problems:   Suicide attempt (HCC)   Severe recurrent major depression without psychotic features (HCC)   Social anxiety disorder   Alcohol abuse   Current Medications:  Current Facility-Administered Medications  Medication Dose Route Frequency Provider Last Rate Last Dose  . acetaminophen (TYLENOL) tablet 650 mg  650 mg Oral Q6H PRN Clapacs, John T, MD      . alum & mag hydroxide-simeth (MAALOX/MYLANTA) 200-200-20 MG/5ML suspension 30 mL  30 mL Oral Q4H PRN Clapacs, John T, MD      . amLODipine (NORVASC) tablet 10 mg  10 mg Oral Daily Clapacs, Jackquline Denmark, MD   10 mg at 11/03/18 0806  . busPIRone (BUSPAR) tablet 10 mg  10 mg Oral TID Clapacs, Jackquline Denmark, MD   10 mg at 11/03/18 0806  . hydrOXYzine (ATARAX/VISTARIL) tablet 50 mg  50 mg Oral TID PRN Clapacs, Jackquline Denmark, MD   50 mg at 11/02/18 2110  . lisinopril (ZESTRIL) tablet 30 mg  30 mg Oral Daily Clapacs, Jackquline Denmark, MD   30 mg at 11/03/18 0804  . magnesium hydroxide (MILK OF MAGNESIA) suspension 30 mL  30 mL Oral Daily PRN Clapacs, John T, MD      . metoprolol tartrate (LOPRESSOR) tablet 50 mg  50 mg Oral BID Clapacs, Jackquline Denmark, MD   50 mg at 11/03/18 0807  . OLANZapine (ZYPREXA) tablet 15 mg  15 mg Oral QHS Clapacs, Jackquline Denmark, MD   15 mg at 11/02/18 2109  . traZODone (DESYREL) tablet 100 mg  100 mg Oral QHS PRN Clapacs, Jackquline Denmark, MD   100 mg at 11/02/18 2109  . venlafaxine XR (EFFEXOR-XR) 24 hr capsule 300 mg  300 mg Oral Q breakfast Clapacs, Jackquline Denmark, MD   300 mg at 11/03/18 0677   PTA Medications: Medications Prior to Admission  Medication Sig Dispense Refill Last Dose  . acetaminophen (TYLENOL) 500 MG tablet Take 1 tablet (500 mg total) by mouth every 6  (six) hours as needed for mild pain. 30 tablet 0   . busPIRone (BUSPAR) 10 MG tablet Take 10 mg by mouth 2 (two) times daily.     . carvedilol (COREG) 25 MG tablet Take 25 mg by mouth 2 (two) times daily with a meal.     . hydrOXYzine (ATARAX/VISTARIL) 25 MG tablet Take 25 mg by mouth 3 (three) times daily as needed for itching.     . levETIRAcetam (KEPPRA) 500 MG tablet Take 500 mg by mouth 2 (two) times daily.     Marland Kitchen lisinopril (ZESTRIL) 10 MG tablet Take 1 tablet (10 mg total) by mouth daily.     . Multiple Vitamins-Minerals (MULTIVITAMIN WITH MINERALS) tablet Take 1 tablet by mouth daily.     Marland Kitchen OLANZapine (ZYPREXA) 20 MG tablet Take 20 mg by mouth at bedtime.     . OXcarbazepine (TRILEPTAL) 150 MG tablet Take 150 mg by mouth daily. Morning dose     . oxcarbazepine (TRILEPTAL) 600 MG tablet Take 600 mg by mouth every evening.     Marland Kitchen oxyCODONE (OXY IR/ROXICODONE) 5 MG immediate release tablet Take 1-2 tablets (5-10 mg total) by mouth every 4 (four) hours as needed (5 mg moderate, 10 mg severe). 30 tablet 0   . venlafaxine (EFFEXOR) 75  MG tablet Take 75 mg by mouth 3 (three) times daily with meals.     . [DISCONTINUED] amLODipine (NORVASC) 10 MG tablet Take 1 tablet (10 mg total) by mouth daily.       Patient Stressors: Paediatric nurse issue Occupational concerns Substance abuse  Patient Strengths: Average or above average intelligence Capable of independent living General fund of knowledge Supportive family/friends  Treatment Modalities: Medication Management, Group therapy, Case management,  1 to 1 session with clinician, Psychoeducation, Recreational therapy.   Physician Treatment Plan for Primary Diagnosis: Suicidal behavior with attempted self-injury [facility]) Long Term Goal(s): Improvement in symptoms so as ready for discharge Improvement in symptoms so as ready for discharge   Short Term Goals: Ability to disclose and discuss suicidal ideas Ability to demonstrate  self-control will improve Compliance with prescribed medications will improve Ability to identify triggers associated with substance abuse/mental health issues will improve  Medication Management: Evaluate patient's response, side effects, and tolerance of medication regimen.  Therapeutic Interventions: 1 to 1 sessions, Unit Group sessions and Medication administration.  Evaluation of Outcomes: Adequate for Discharge  Physician Treatment Plan for Secondary Diagnosis: Principal Problem:   Suicidal behavior with attempted self-injury [facility]) Active Problems:   Suicide attempt (HCC)   Severe recurrent major depression without psychotic features (HCC)   Social anxiety disorder   Alcohol abuse  Long Term Goal(s): Improvement in symptoms so as ready for discharge Improvement in symptoms so as ready for discharge   Short Term Goals: Ability to disclose and discuss suicidal ideas Ability to demonstrate self-control will improve Compliance with prescribed medications will improve Ability to identify triggers associated with substance abuse/mental health issues will improve     Medication Management: Evaluate patient's response, side effects, and tolerance of medication regimen.  Therapeutic Interventions: 1 to 1 sessions, Unit Group sessions and Medication administration.  Evaluation of Outcomes: Adequate for Discharge   RN Treatment Plan for Primary Diagnosis: Suicidal behavior with attempted self-injury [facility]) Long Term Goal(s): Knowledge of disease and therapeutic regimen to maintain health will improve  Short Term Goals: Ability to participate in decision making will improve, Ability to disclose and discuss suicidal ideas, Ability to identify and develop effective coping behaviors will improve and Compliance with prescribed medications will improve  Medication Management: RN will administer medications as ordered by provider, will assess and evaluate patient's response and provide  education to patient for prescribed medication. RN will report any adverse and/or side effects to prescribing provider.  Therapeutic Interventions: 1 on 1 counseling sessions, Psychoeducation, Medication administration, Evaluate responses to treatment, Monitor vital signs and CBGs as ordered, Perform/monitor CIWA, COWS, AIMS and Fall Risk screenings as ordered, Perform wound care treatments as ordered.  Evaluation of Outcomes: Adequate for Discharge   LCSW Treatment Plan for Primary Diagnosis: Suicidal behavior with attempted self-injury [facility]) Long Term Goal(s): Safe transition to appropriate next level of care at discharge, Engage patient in therapeutic group addressing interpersonal concerns.  Short Term Goals: Engage patient in aftercare planning with referrals and resources  Therapeutic Interventions: Assess for all discharge needs, 1 to 1 time with Social worker, Explore available resources and support systems, Assess for adequacy in community support network, Educate family and significant other(s) on suicide prevention, Complete Psychosocial Assessment, Interpersonal group therapy.  Evaluation of Outcomes: Adequate for Discharge   Progress in Treatment: Attending groups: Yes. Participating in groups: Yes. Taking medication as prescribed: Yes. Toleration medication: Yes. Family/Significant other contact made: Yes, individual(s) contacted:  pt declined Patient understands diagnosis:  Yes. Discussing patient identified problems/goals with staff: Yes. Medical problems stabilized or resolved: No. Denies suicidal/homicidal ideation: No. Issues/concerns per patient self-inventory: No. Other: NA  New problem(s) identified: No, Describe:  none reported  New Short Term/Long Term Goal(s):Attend outpatient treatment, take medication as prescribed, develop and implement healthy coping methods Update 11/03/18: medication management for mood stabilization; elimination of SI thoughts;  development of comprehensive mental wellness plan.  Patient Goals:  "more comfortable around people and feel better"  Discharge Plan or Barriers: Pt will return home and follow up at the [facility] for outpatient treatment.  Update 11/03/18:  [facility] [place] [facility] Patient reports plans to return home.   Reason for Continuation of Hospitalization: Medication stabilization Suicidal ideation  Estimated Length of Stay:5-7 days  Recreational Therapy: Patient Stressors: N/A  Patient Goal: Patient will identify 3 healthy leisure activities that can be utilized post d/c within 5 recreation therapy group sessions  Attendees: Patient: 11/03/2018 9:34 AM  Physician: Dr. Weber Cooks, MD 11/03/2018 9:34 AM  Nursing:  11/03/2018 9:34 AM  RN Care Manager: 11/03/2018 9:34 AM  Social Worker: Assunta Curtis, LCSW 11/03/2018 9:34 AM  Recreational Therapist:  11/03/2018 9:34 AM  Other:  11/03/2018 9:34 AM  Other:  11/03/2018 9:34 AM  Other: 11/03/2018 9:34 AM    Scribe for Treatment Team: Rozann Lesches, LCSW 11/03/2018 9:34 AM

## 2019-03-30 ENCOUNTER — Other Ambulatory Visit (HOSPITAL_COMMUNITY): Payer: Self-pay

## 2019-03-30 MED ORDER — ENTRESTO 24-26 MG PO TABS: 1.0000 | ORAL_TABLET | Freq: Two times a day (BID) | ORAL | 11 refills | Status: DC

## 2021-07-16 ENCOUNTER — Other Ambulatory Visit: Payer: Self-pay

## 2021-07-16 ENCOUNTER — Emergency Department (HOSPITAL_COMMUNITY)
Admission: EM | Admit: 2021-07-16 | Discharge: 2021-07-17 | Disposition: A | Discharge status: Home / Self Care | Payer: No Typology Code available for payment source | Attending: Emergency Medicine | Admitting: Emergency Medicine

## 2021-07-16 DIAGNOSIS — R45851 Suicidal ideations: Secondary | ICD-10-CM | POA: Insufficient documentation

## 2021-07-16 DIAGNOSIS — Z20822 Contact with and (suspected) exposure to covid-19: Secondary | ICD-10-CM | POA: Insufficient documentation

## 2021-07-16 DIAGNOSIS — F332 Major depressive disorder, recurrent severe without psychotic features: Secondary | ICD-10-CM | POA: Insufficient documentation

## 2021-07-16 DIAGNOSIS — R4789 Other speech disturbances: Secondary | ICD-10-CM | POA: Diagnosis not present

## 2021-07-16 DIAGNOSIS — Y900 Blood alcohol level of less than 20 mg/100 ml: Secondary | ICD-10-CM | POA: Insufficient documentation

## 2021-07-16 DIAGNOSIS — Z046 Encounter for general psychiatric examination, requested by authority: Secondary | ICD-10-CM | POA: Diagnosis present

## 2021-07-16 DIAGNOSIS — F419 Anxiety disorder, unspecified: Secondary | ICD-10-CM | POA: Diagnosis not present

## 2021-07-16 LAB — CBC
HCT: 35.4 % — ABNORMAL LOW (ref 39.0–52.0)
Hemoglobin: 12.2 g/dL — ABNORMAL LOW (ref 13.0–17.0)
MCH: 33.7 pg (ref 26.0–34.0)
MCHC: 34.5 g/dL (ref 30.0–36.0)
MCV: 97.8 fL (ref 80.0–100.0)
Platelets: 240 10*3/uL (ref 150–400)
RBC: 3.62 MIL/uL — ABNORMAL LOW (ref 4.22–5.81)
RDW: 12.8 % (ref 11.5–15.5)
WBC: 6.8 10*3/uL (ref 4.0–10.5)
nRBC: 0 % (ref 0.0–0.2)

## 2021-07-16 LAB — RAPID URINE DRUG SCREEN, HOSP PERFORMED
Amphetamines: NOT DETECTED
Barbiturates: NOT DETECTED
Benzodiazepines: NOT DETECTED
Cocaine: NOT DETECTED
Opiates: NOT DETECTED
Tetrahydrocannabinol: NOT DETECTED

## 2021-07-16 LAB — COMPREHENSIVE METABOLIC PANEL
ALT: 23 U/L (ref 0–44)
AST: 21 U/L (ref 15–41)
Albumin: 4.1 g/dL (ref 3.5–5.0)
Alkaline Phosphatase: 77 U/L (ref 38–126)
Anion gap: 6 (ref 5–15)
BUN: 15 mg/dL (ref 6–20)
CO2: 29 mmol/L (ref 22–32)
Calcium: 9.5 mg/dL (ref 8.9–10.3)
Chloride: 105 mmol/L (ref 98–111)
Creatinine, Ser: 0.89 mg/dL (ref 0.61–1.24)
GFR, Estimated: >60 mL/min (ref >60)
Glucose, Bld: 108 mg/dL — ABNORMAL HIGH (ref 70–99)
Potassium: 3.7 mmol/L (ref 3.5–5.1)
Sodium: 140 mmol/L (ref 135–145)
Total Bilirubin: 0.6 mg/dL (ref 0.3–1.2)
Total Protein: 6.9 g/dL (ref 6.5–8.1)

## 2021-07-16 LAB — RESP PANEL BY RT-PCR (FLU A&B, COVID) ARPGX2
Influenza A by PCR: NEGATIVE
Influenza B by PCR: NEGATIVE
SARS Coronavirus 2 by RT PCR: NEGATIVE

## 2021-07-16 LAB — SALICYLATE LEVEL: Salicylate Lvl: <7 mg/dL — ABNORMAL LOW (ref 7.0–30.0)

## 2021-07-16 LAB — ACETAMINOPHEN LEVEL: Acetaminophen (Tylenol), Serum: <10 ug/mL — ABNORMAL LOW (ref 10–30)

## 2021-07-16 LAB — ETHANOL: Alcohol, Ethyl (B): <10 mg/dL (ref <10)

## 2021-07-16 MED ORDER — METOPROLOL TARTRATE 25 MG PO TABS
50.0000 mg | ORAL_TABLET | Freq: Two times a day (BID) | ORAL | Status: DC
  Administered 2021-07-16: 50 mg via ORAL
  Filled 2021-07-16 (×2): qty 2

## 2021-07-16 MED ORDER — AMLODIPINE BESYLATE 5 MG PO TABS
10.0000 mg | ORAL_TABLET | Freq: Every day | ORAL | Status: DC
  Administered 2021-07-16: 10 mg via ORAL
  Filled 2021-07-16: qty 2

## 2021-07-16 MED ORDER — LISINOPRIL 20 MG PO TABS
40.0000 mg | ORAL_TABLET | Freq: Every day | ORAL | Status: DC
  Administered 2021-07-16: 40 mg via ORAL
  Filled 2021-07-16: qty 2

## 2021-07-16 MED ORDER — LORAZEPAM 1 MG PO TABS
1.0000 mg | ORAL_TABLET | Freq: Once | ORAL | Status: AC
  Administered 2021-07-16: 1 mg via ORAL
  Filled 2021-07-16: qty 1

## 2021-07-16 MED ORDER — BUSPIRONE HCL 10 MG PO TABS
10.0000 mg | ORAL_TABLET | Freq: Two times a day (BID) | ORAL | Status: DC
  Administered 2021-07-16: 10 mg via ORAL
  Filled 2021-07-16 (×2): qty 1

## 2021-07-16 MED ORDER — VENLAFAXINE HCL ER 37.5 MG PO CP24
37.5000 mg | ORAL_CAPSULE | Freq: Every day | ORAL | Status: DC
  Filled 2021-07-16: qty 1

## 2021-07-16 NOTE — ED Notes (Signed)
Patients belongings in locker #6

## 2021-07-16 NOTE — ED Provider Triage Note (Signed)
Emergency Medicine Provider Triage Evaluation Note  Anselm Pancoast Jarrells III , a 56 y.o. male  was evaluated in triage.  Pt complains of feeling overwhelmed/depression/anxious in social situations.  He states that he has had episodes like this since he was a child and that he has been hospitalized in the past.  Patient states that he was overwhelmed approximately 1 month ago and stopped taking all of his medicine.  [facility].  He denies SI, HI but notes he has had feelings of SI in the past with some attempts.  Currently endorsing no other symptoms. Denies fever, chills, night sweats, chest pain, shortness of breath, abdominal pain, N/V/D, urinary symptoms, change in bowel habits.  Review of Systems  Positive: See above Negative:   Physical Exam  There were no vitals taken for this visit. Gen:   Awake, no distress   Resp:  Normal effort  MSK:   Moves extremities without difficulty  Other:    Medical Decision Making  Medically screening exam initiated at 11:11 AM.  Appropriate orders placed.  Anselm Pancoast Aslinger III was informed that the remainder of the evaluation will be completed by another provider, this initial triage assessment does not replace that evaluation, and the importance of remaining in the ED until their evaluation is complete.     Peter Garter, [place] 07/16/21 1114

## 2021-07-16 NOTE — ED Triage Notes (Signed)
Patient here with complaint of anxiety and depression, reports being admitted to multiple inpatient BH units. Denies AHV, denies HI, when asked about suicidal ideation patient states "I guess. It's a way out".

## 2021-07-16 NOTE — ED Notes (Signed)
Received verbal report from Clara O RN at this time ?

## 2021-07-16 NOTE — BH Assessment (Addendum)
TTS completed. Psych cleared by Sindy Guadeloupe, NP. Patient to discharge home and follow up with the [place] in [place]. He is connected with the [facility] and knows to follow up with his appointments that are already established. He reportedly see's the therapist weekly and psychiatrist every 3 months. Also, the [facility] has a [facility] hour nurse [facility] and patient was advised to call that line anytime he has questions about his current meds.    Patient's nurse (Clara, RN) provided disposition updates.

## 2021-07-16 NOTE — ED Provider Notes (Signed)
[facility] EMERGENCY DEPARTMENT Provider Note   CSN: [id number] Arrival date & time: 07/16/21  1026     History  Chief Complaint  Patient presents with   Suicidal    David Dennis is a 56 y.o. male.  HPI Patient presents due to concern of despondency, labile suicidal ideation.  No physical pain.  Patient has a history of depression, agoraphobia, stopping his medication sometime ago for unclear reasons.  He lives with his parents, notes that that is causing additional stress to an already stressful lifestyle. He presents for assistance.    Home Medications Prior to Admission medications   Medication Sig Start Date End Date Taking? Authorizing Provider  acetaminophen (TYLENOL) 325 MG tablet Take 2 tablets (650 mg total) by mouth every 6 (six) hours as needed for mild pain or headache. 04/21/18   Connye Burkitt, NP  amLODipine (NORVASC) 10 MG tablet Take 1 tablet (10 mg total) by mouth daily. 11/03/18   Clapacs, Madie Reno, MD  antiseptic oral rinse (BIOTENE) LIQD 15 mLs by Mouth Rinse route as needed for dry mouth. 04/21/18   Connye Burkitt, NP  busPIRone (BUSPAR) 10 MG tablet Take 1 tablet (10 mg total) by mouth 2 (two) times daily. For anxiety 04/21/18   Connye Burkitt, NP  busPIRone (BUSPAR) 10 MG tablet Take 1 tablet (10 mg total) by mouth 3 (three) times daily. 11/03/18   Clapacs, Madie Reno, MD  carvedilol (COREG) 25 MG tablet Take 1 tablet (25 mg total) by mouth 2 (two) times daily with a meal. For high blood pressure 04/21/18   Connye Burkitt, NP  hydrOXYzine (ATARAX/VISTARIL) 25 MG tablet Take 1 tablet (25 mg total) by mouth every 6 (six) hours as needed for anxiety. 04/21/18   Connye Burkitt, NP  levETIRAcetam (KEPPRA) 500 MG tablet Take 1 tablet (500 mg total) by mouth 2 (two) times daily. For seizures 04/21/18   Connye Burkitt, NP  lisinopril (ZESTRIL) 30 MG tablet Take 1 tablet (30 mg total) by mouth daily. 11/03/18   Clapacs, Madie Reno, MD  metoprolol tartrate  (LOPRESSOR) 50 MG tablet Take 1 tablet (50 mg total) by mouth 2 (two) times daily. 11/03/18   Clapacs, Madie Reno, MD  Multiple Vitamins-Minerals (MULTIVITAL PO) Take 1 tablet by mouth daily.    [provider]  OLANZapine (ZYPREXA) 15 MG tablet Take 1 tablet (15 mg total) by mouth at bedtime. 11/03/18   Clapacs, Madie Reno, MD  OLANZapine (ZYPREXA) 20 MG tablet Take 1 tablet (20 mg total) by mouth at bedtime. 04/21/18   Connye Burkitt, NP  OXcarbazepine (TRILEPTAL) 150 MG tablet Take 1 tablet (150 mg total) by mouth daily. For mood 04/22/18   Connye Burkitt, NP  Oxcarbazepine (TRILEPTAL) 600 MG tablet Take 1 tablet (600 mg total) by mouth at bedtime. For mood 04/21/18   Connye Burkitt, NP  sacubitril-valsartan (ENTRESTO) 24-26 MG Take 1 tablet by mouth 2 (two) times daily. 03/30/19   Larey Dresser, MD  traZODone (DESYREL) 100 MG tablet Take 1 tablet (100 mg total) by mouth at bedtime as needed for sleep. 11/03/18   Clapacs, Madie Reno, MD  venlafaxine XR (EFFEXOR-XR) 150 MG 24 hr capsule Take 2 capsules (300 mg total) by mouth daily with breakfast. 11/03/18   Clapacs, Madie Reno, MD  venlafaxine XR (EFFEXOR-XR) 75 MG 24 hr capsule Take 3 capsules (225 mg total) by mouth daily with breakfast. For mood 04/22/18   Jenne Campus,  Loura Pardon, NP      Allergies    Patient has no known allergies.    Review of Systems   Review of Systems  All other systems reviewed and are negative.  Physical Exam Updated Vital Signs BP (!) 174/122 (BP Location: Right Arm)   Pulse 70   Temp 98.7 F (37.1 C) (Oral)   Resp 18   SpO2 100%  Physical Exam Vitals and nursing note reviewed.  Constitutional:      General: He is not in acute distress.    Appearance: He is well-developed.  HENT:     Head: Normocephalic and atraumatic.  Eyes:     Conjunctiva/sclera: Conjunctivae normal.  Cardiovascular:     Rate and Rhythm: Normal rate and regular rhythm.  Pulmonary:     Effort: Pulmonary effort is normal. No respiratory distress.      Breath sounds: No stridor.  Abdominal:     General: There is no distension.  Skin:    General: Skin is warm and dry.  Neurological:     Mental Status: He is alert and oriented to person, place, and time.  Psychiatric:        Speech: Speech is delayed.        Behavior: Behavior is withdrawn.        Thought Content: Thought content includes suicidal (Vaguely) ideation.        Cognition and Memory: Cognition is not impaired. Memory is not impaired.    ED Results / Procedures / Treatments   Labs (all labs ordered are listed, but only abnormal results are displayed) Labs Reviewed  COMPREHENSIVE METABOLIC PANEL - Abnormal; Notable for the following components:      Result Value   Glucose, Bld 108 (*)    All other components within normal limits  CBC - Abnormal; Notable for the following components:   RBC 3.62 (*)    Hemoglobin 12.2 (*)    HCT 35.4 (*)    All other components within normal limits  ETHANOL  SALICYLATE LEVEL  ACETAMINOPHEN LEVEL  RAPID URINE DRUG SCREEN, HOSP PERFORMED    EKG None  Radiology No results found.  Procedures Procedures    Medications Ordered in ED Medications - No data to display  ED Course/ Medical Decision Making/ A&P  This patient with a Hx of anxiety, depression, agoraphobia presents to the ED for concern of ideation, despondency, this involves an extensive number of treatment options, and is a complaint that carries with it a high risk of complications and morbidity.    The differential diagnosis includes suicidal ideation, possibly secondary to medication noncompliance, progression of psychiatric disease   Social Determinants of Health:  Psychiatric disease, disenfranchised status  Additional history obtained:  Additional history and/or information obtained from chart review, notable for history of psychiatric disease   After the initial evaluation, orders, including: Labs were initiated.     On repeat evaluation of the  patient stayed the same  Lab Tests:  I personally interpreted labs.  The pertinent results include: No notable findings   Consultations Obtained:  I requested consultation with the behavioral health,  and discussed lab findings  Dispostion / Final MDM:  After consideration of the diagnostic results and the patient's response to treatment, patient is medically cleared for behavioral health evaluation.  He is awake, alert, presenting with concern for despondency, vague suicidal suggestions.  Patient has elevated risk given his disenfranchised status, history of psychiatric disease, white skin color.     Vanita Panda,  Herbie Baltimore, MD 07/16/21 1249

## 2021-07-17 NOTE — ED Notes (Signed)
Pt ambulated to restroom. 

## 2021-07-17 NOTE — ED Notes (Signed)
Belongings returned to pt at this time

## 2021-07-17 NOTE — Progress Notes (Signed)
Pt is psych cleared per by Evette Georges, NP. CSW will now remove pt from the [facility] shift report and TOC will assist with any discharge needs.  Benjaman Kindler, MSW, LCSWA 07/17/2021 12:41 AM

## 2021-07-17 NOTE — Discharge Instructions (Addendum)
You were seen today for suicidal ideation.  Follow-up the [facility] in [place] as instructed by psychiatry.  If you have any new or worsening symptoms, you should be reevaluated.

## 2021-07-17 NOTE — ED Provider Notes (Signed)
Patient cleared by psychiatry for follow-up at [facility].  We will provide discharge resources.   Shon Baton, MD 07/17/21 [phone]
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
## 2021-07-25 IMAGING — DX DG CHEST 1V PORT
1 series · 1 of 1 positions shown · non-contrast
Comparison: Chest x-ray 04/03/2018.

CLINICAL DATA: 53-year-old male with history of level 1 trauma from
a fall.

EXAM:
PORTABLE CHEST 1 VIEW

[chest]
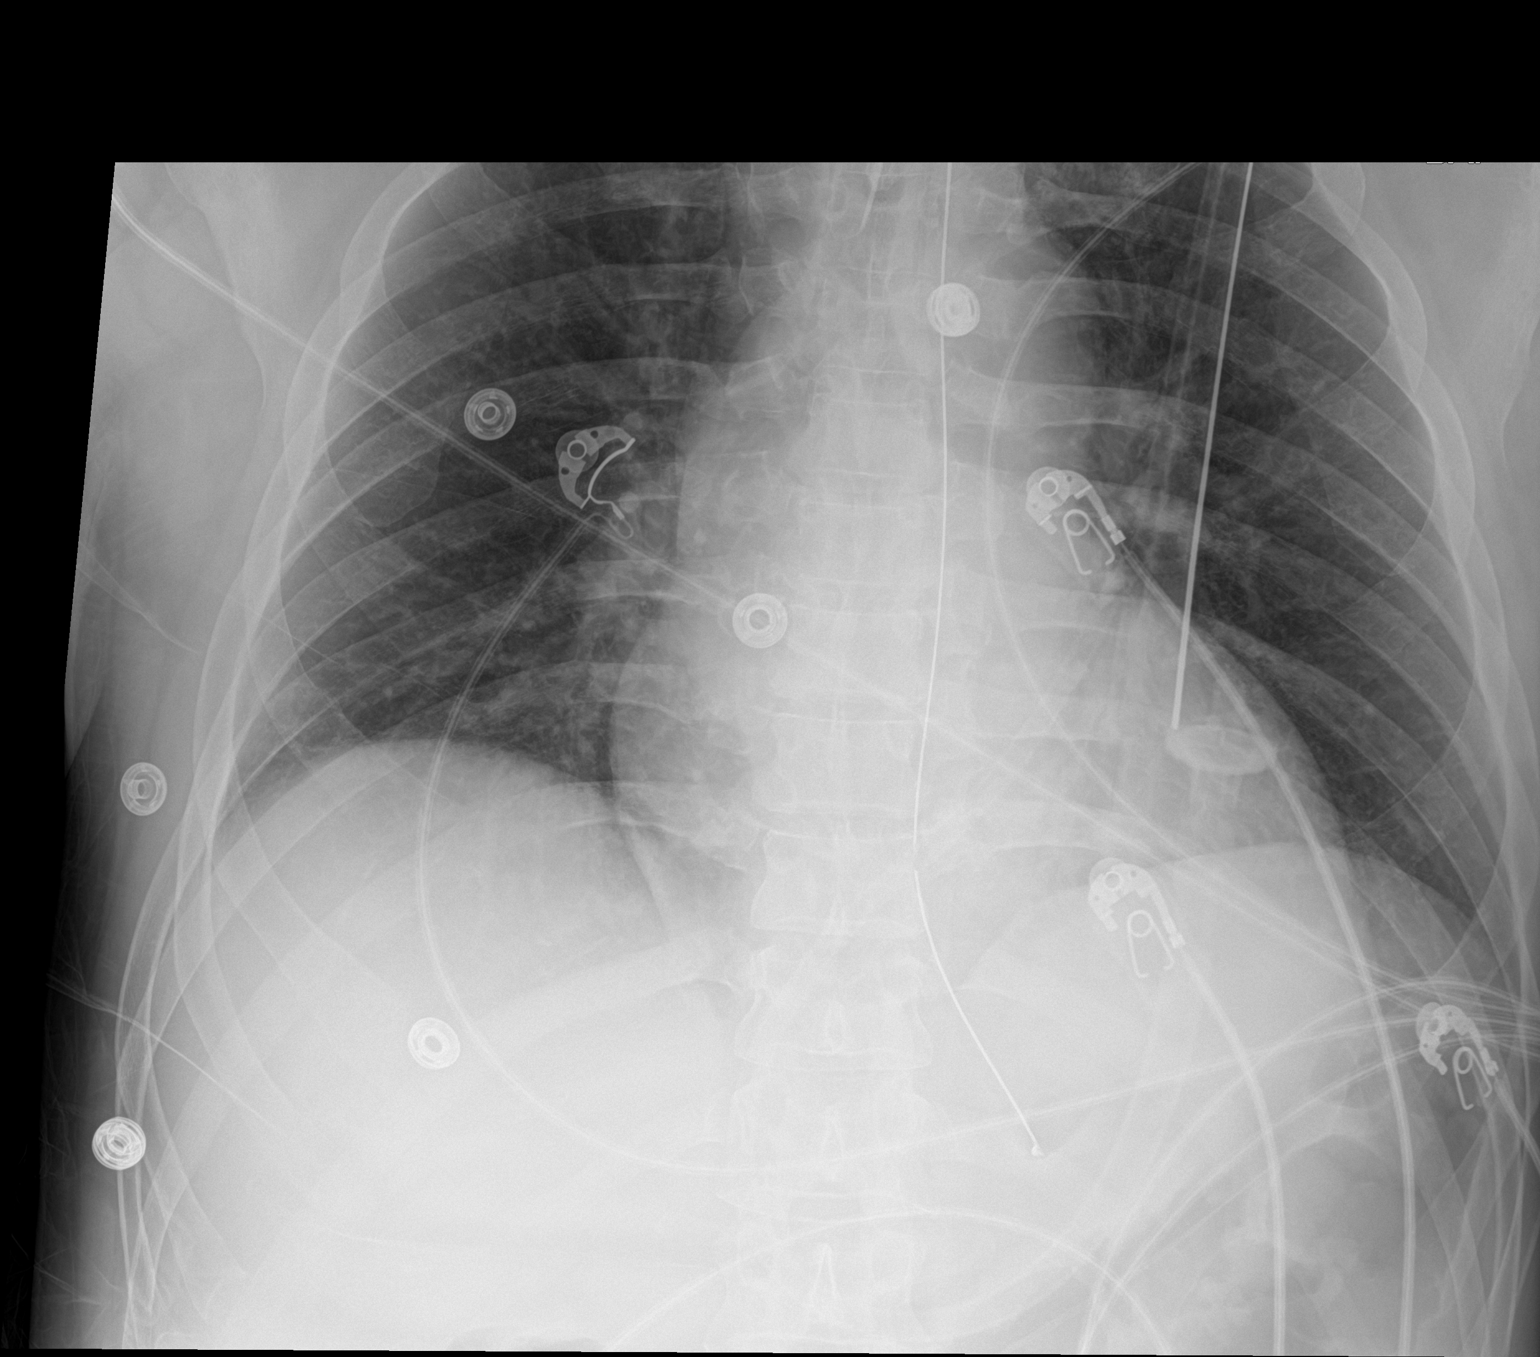

[1 of 1 positions shown; findings below may reference images not displayed]

FINDINGS: An endotracheal tube is in place with tip 3.6 cm above the carina.
Nasogastric tube with tip in the proximal stomach and side port in
the distal third of the esophagus shortly before the
gastroesophageal junction. Lung volumes are low. No consolidative
airspace disease. No pleural effusions. No pneumothorax. No
pulmonary nodule or mass noted. Pulmonary vasculature and the
cardiomediastinal silhouette are within normal limits.
IMPRESSION: 1. Support apparatus, as above.
2. Low lung volumes without radiographic evidence of acute
cardiopulmonary disease.

## 2021-07-26 IMAGING — CT CT HEAD W/O CM
4 series · 17 of 47 positions shown, 19 images · non-contrast
Comparison: 10/21/2018

CLINICAL DATA: Follow-up subdural hemorrhage

EXAM:
CT HEAD WITHOUT CONTRAST
TECHNIQUE: Contiguous axial images were obtained from the base of the skull
through the vertex without intravenous contrast.

[Series 3: head wo · axial · 0.44mm/px · z∈[-105,+25]mm · 7 of 36 slices shown, 9 images]
[im 5/36  brain]
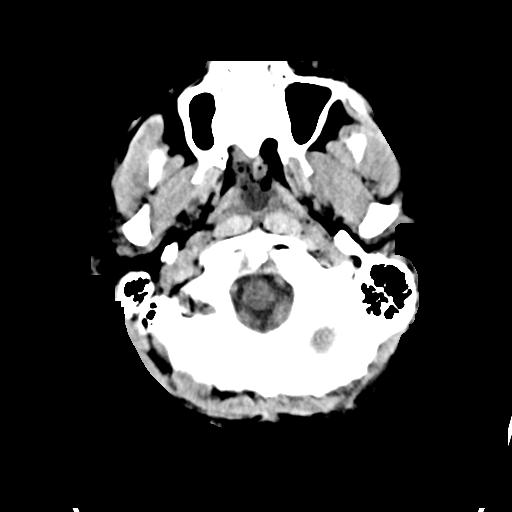
[im 5/36  bone]
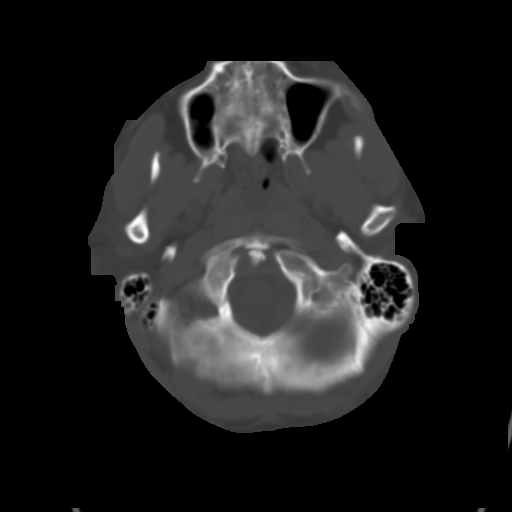
[im 9/36  brain]
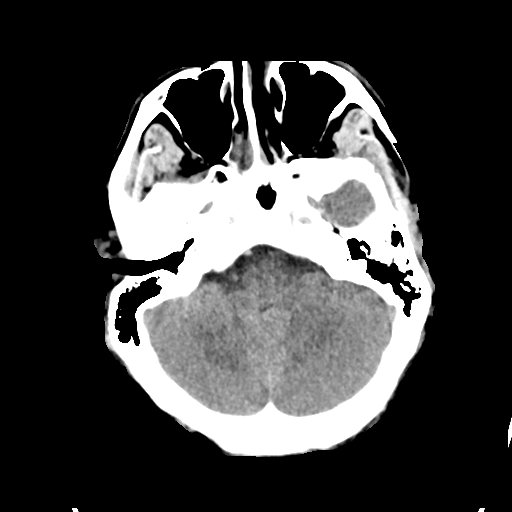
[im 14/36  brain]
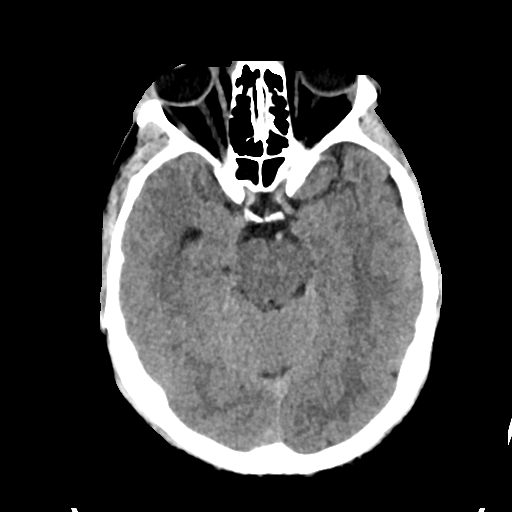
[im 18/36  brain]
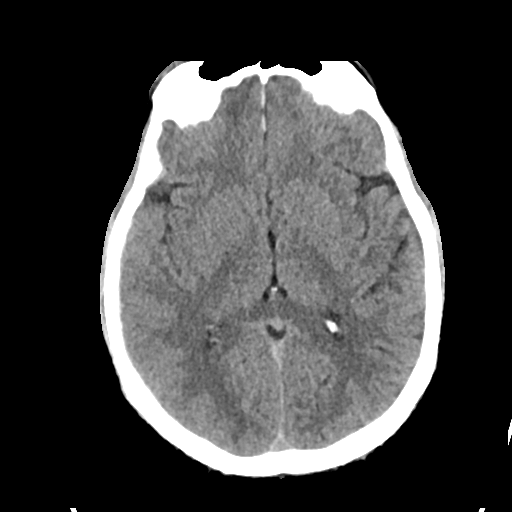
[im 22/36  brain]
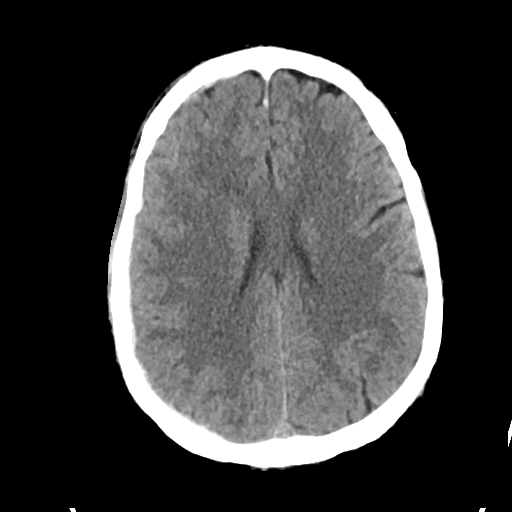
[im 22/36  bone]
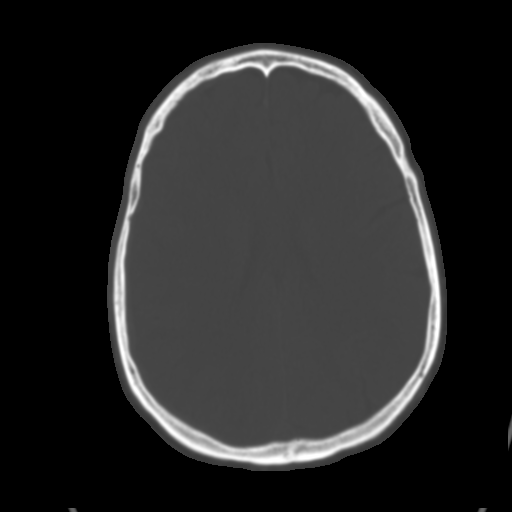
[im 27/36  brain]
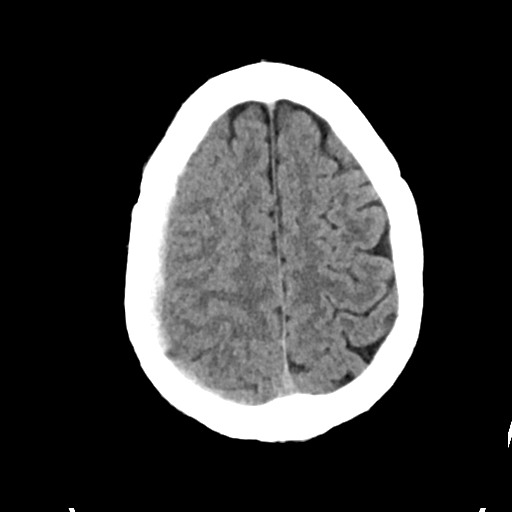
[im 31/36  brain]
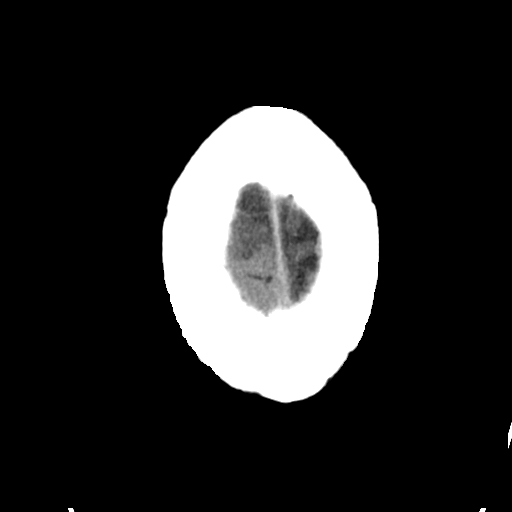

[Series 4: head bone · axial · 0.44mm/px · z∈[-109,-45]mm · 4 of 90 slices shown]
[im 9/90  bone]
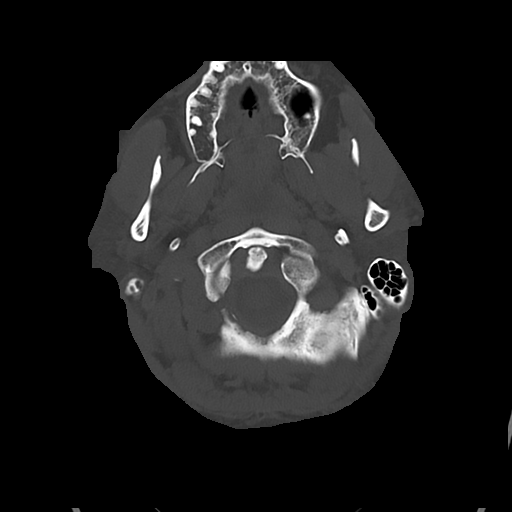
[im 18/90  bone]
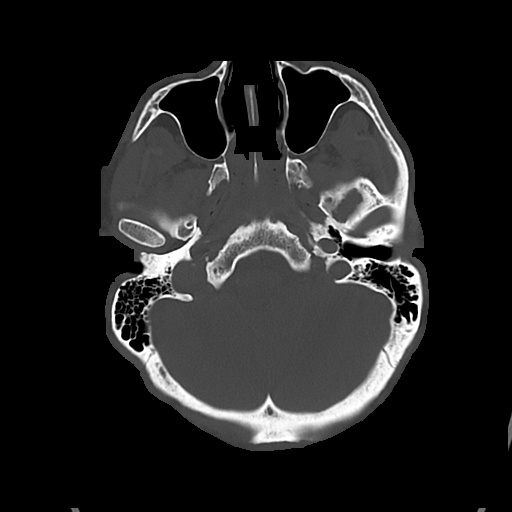
[im 27/90  bone]
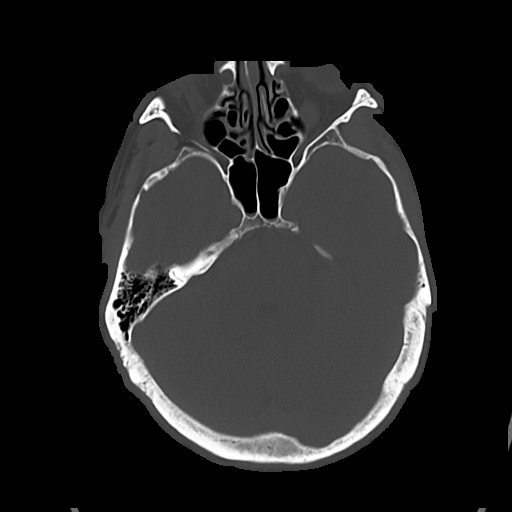
[im 41/90  bone]
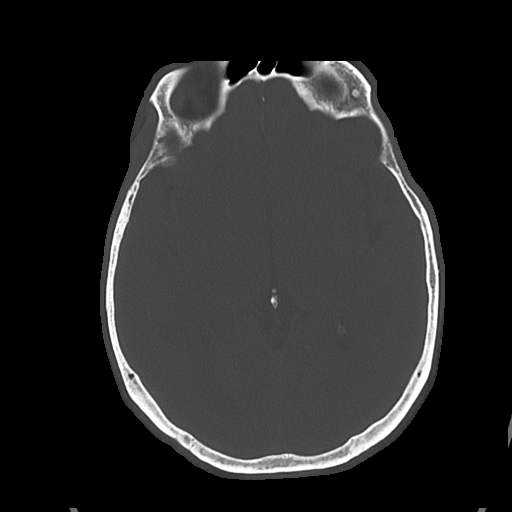

[Series 5: cor soft · coronal · 0.36mm/px · 3 of 74 slices shown]
[im 25/74  brain]
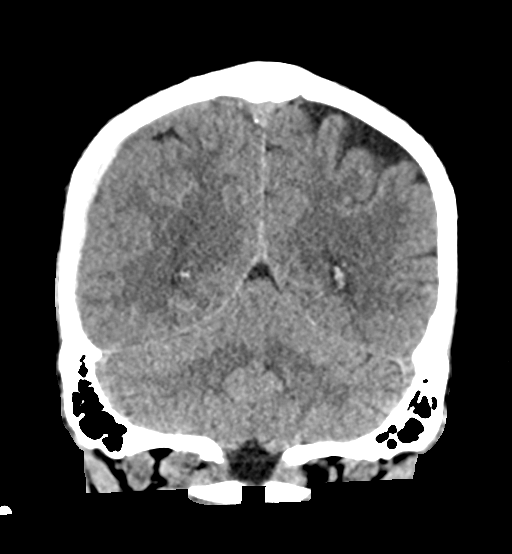
[im 33/74  brain]
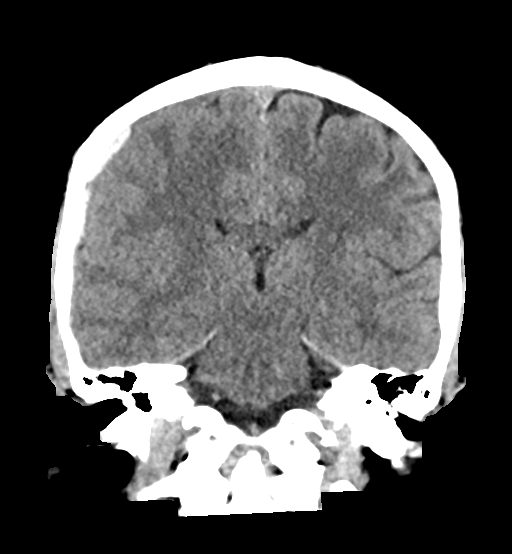
[im 41/74  brain]
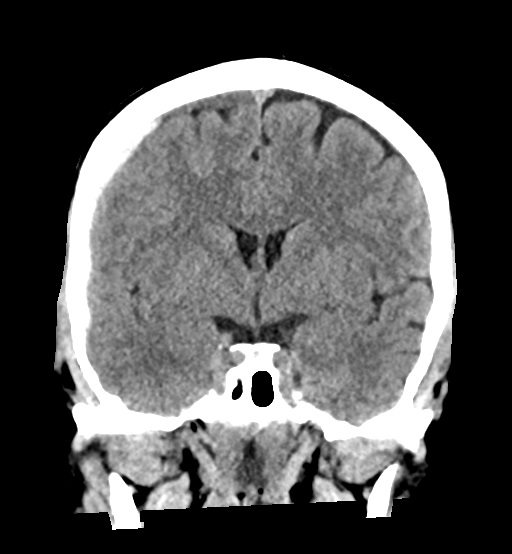

[Series 6: sag soft · sagittal · 0.38mm/px · 3 of 62 slices shown]
[im 21/62  brain]
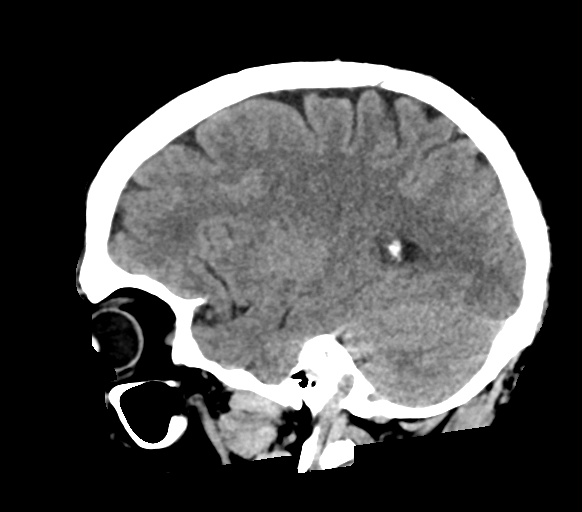
[im 31/62  brain]
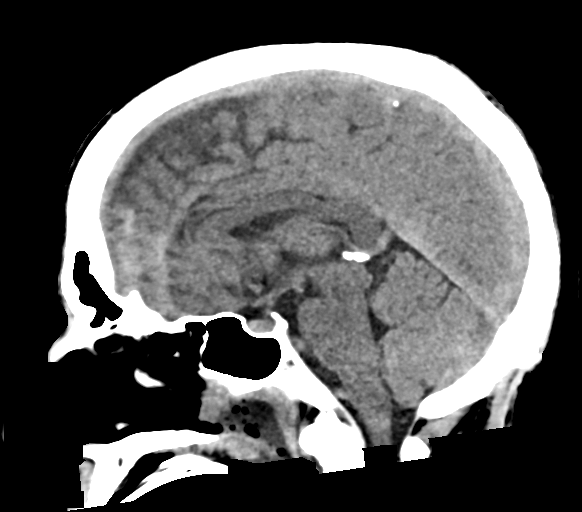
[im 41/62  brain]
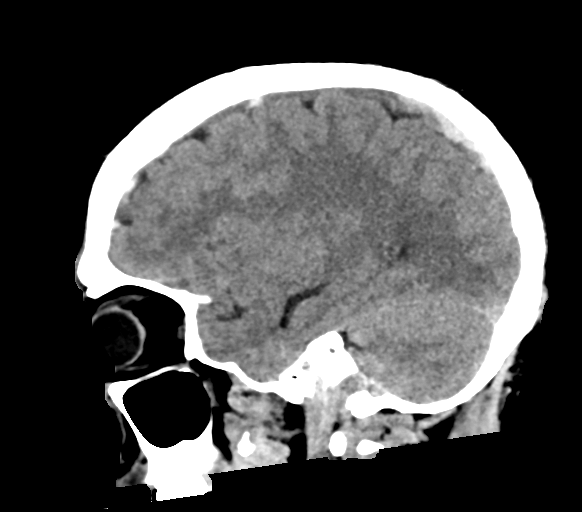

[17 of 47 positions shown; findings below may reference images not displayed]

FINDINGS: Brain: Right subdural hematoma is again noted, stable in size since
prior study measuring 6 mm in thickness. No significant midline
shift. No hydrocephalus or acute infarction.

Vascular: No hyperdense vessel or unexpected calcification.

Skull: No acute calvarial abnormality.

Sinuses/Orbits: Visualized paranasal sinuses and mastoids clear.
Orbital soft tissues unremarkable.

Other: None
IMPRESSION: 6 mm right subdural hematoma. Is stable in size since prior study.
No measurable midline shift.

## 2021-07-27 IMAGING — DX DG CHEST 1V PORT
1 series · 1 of 1 positions shown · non-contrast
Comparison: 10/22/2018

CLINICAL DATA: History of subdural hematoma with recent seizure
activity

EXAM:
PORTABLE CHEST 1 VIEW

[chest ap]
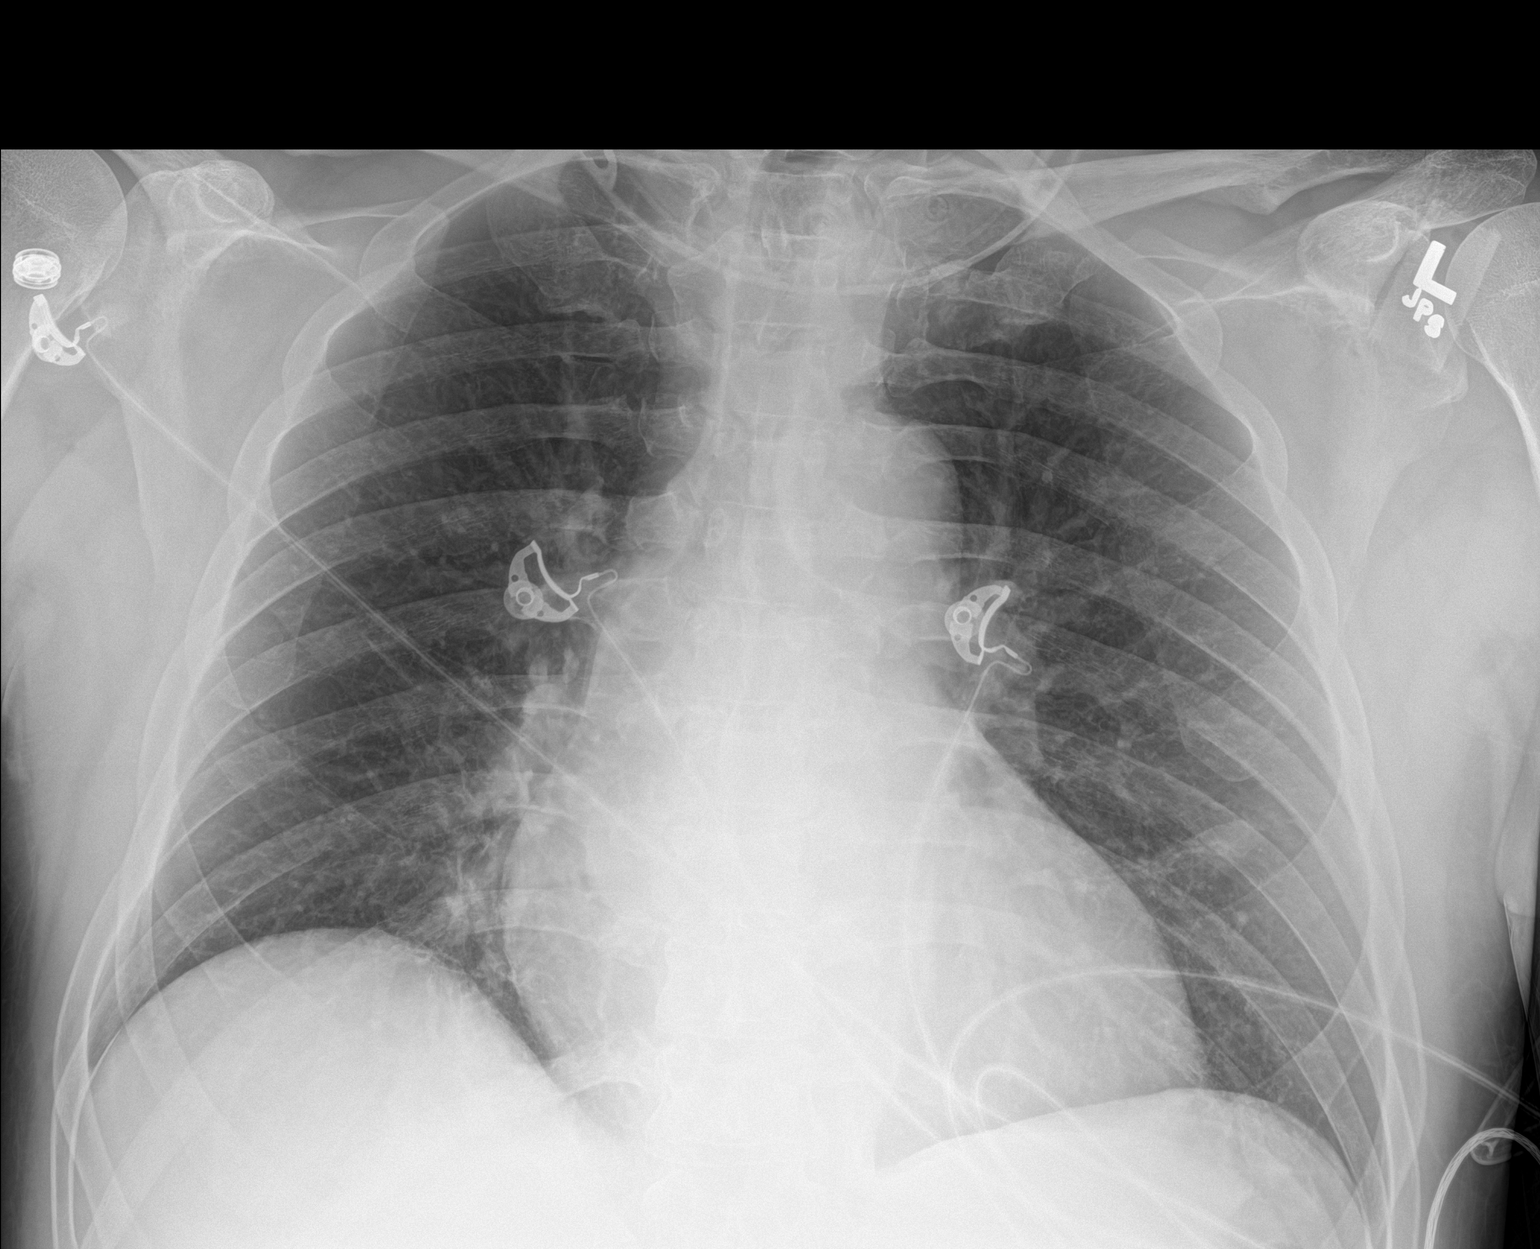

[1 of 1 positions shown; findings below may reference images not displayed]

FINDINGS: Cardiac shadow is at the upper limits of normal in size. The lungs
are well aerated bilaterally. No focal infiltrate or sizable
effusion is seen. No bony abnormality is noted. Gastric catheter and
endotracheal tube have been removed in the interval.
IMPRESSION: No acute abnormality noted.
